# Patient Record
Sex: Female | Born: 1985 | Race: Black or African American | Hispanic: No | Marital: Married | Smoking: Former smoker
Health system: Southern US, Community
[De-identification: ages and names within clinical notes are randomized; demographics above are authoritative.]

## PROBLEM LIST (undated history)

## (undated) ENCOUNTER — Inpatient Hospital Stay (HOSPITAL_COMMUNITY): Payer: Self-pay

## (undated) DIAGNOSIS — K047 Periapical abscess without sinus: Secondary | ICD-10-CM

## (undated) DIAGNOSIS — D693 Immune thrombocytopenic purpura: Secondary | ICD-10-CM

## (undated) DIAGNOSIS — K029 Dental caries, unspecified: Secondary | ICD-10-CM

## (undated) HISTORY — DX: Immune thrombocytopenic purpura: D69.3

## (undated) HISTORY — PX: OTHER SURGICAL HISTORY: SHX169

---

## 2004-08-02 ENCOUNTER — Inpatient Hospital Stay (HOSPITAL_COMMUNITY): Admission: AD | Admit: 2004-08-02 | Discharge: 2004-08-04 | Payer: Self-pay | Admitting: *Deleted

## 2004-09-27 ENCOUNTER — Inpatient Hospital Stay (HOSPITAL_COMMUNITY): Admission: AD | Admit: 2004-09-27 | Discharge: 2004-09-27 | Payer: Self-pay | Admitting: *Deleted

## 2004-09-27 IMAGING — US US OB COMP LESS 14 WK
1 series · 18 of 28 positions shown · non-contrast
Comparison: none

HISTORY: Cramping, flank pain, pregnant

[Series 1: us ob comp<14 wk · 18 of 49 slices shown]
[im 1/49]
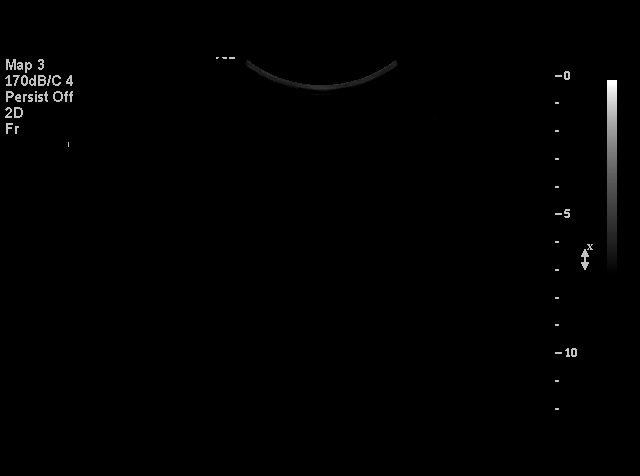
[im 4/49]
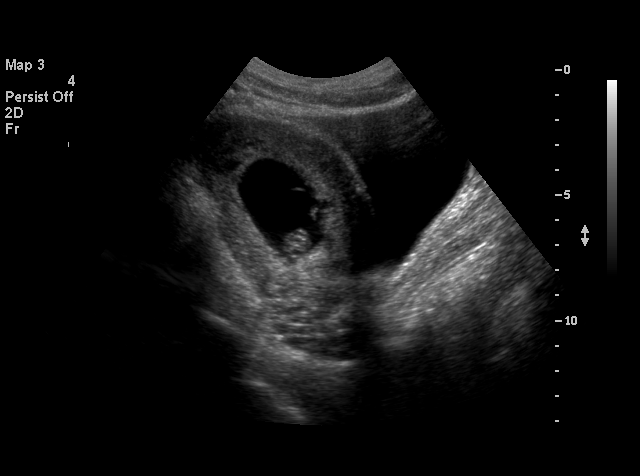
[im 6/49]
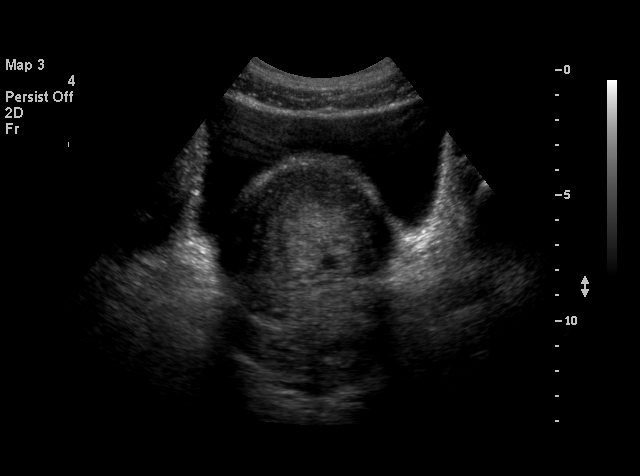
[im 9/49]
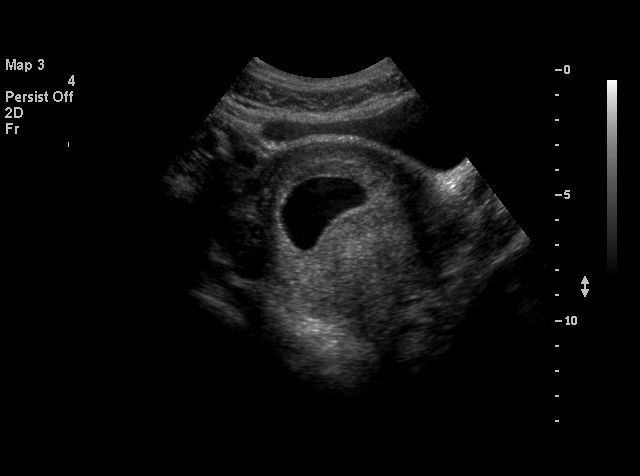
[im 13/49]
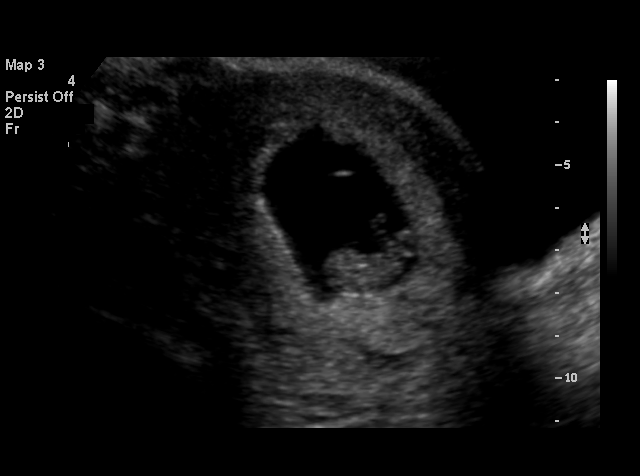
[im 15/49]
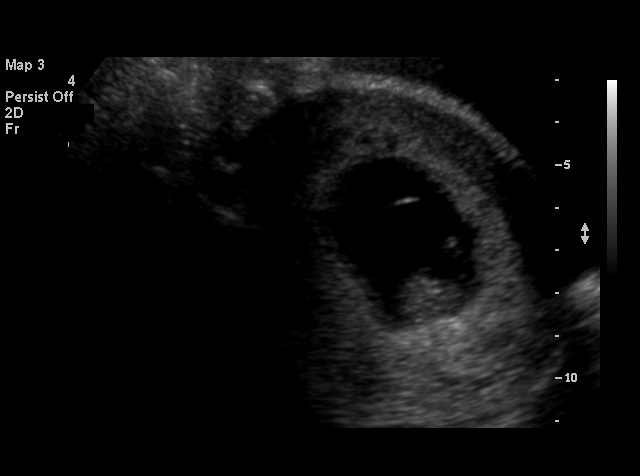
[im 18/49]
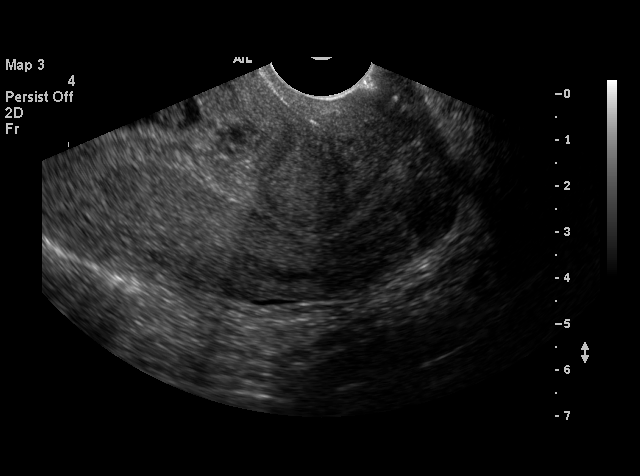
[im 20/49]
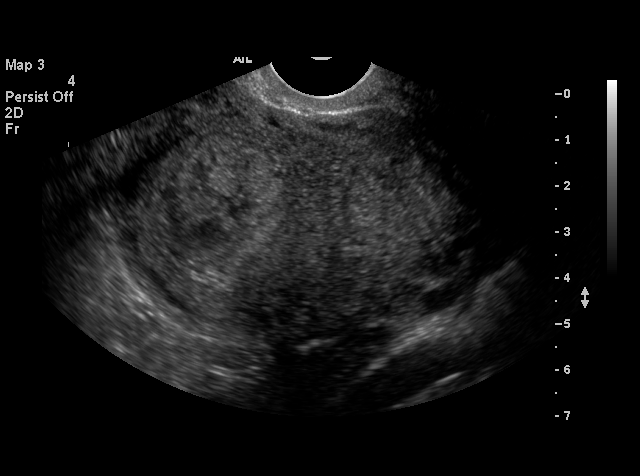
[im 24/49]
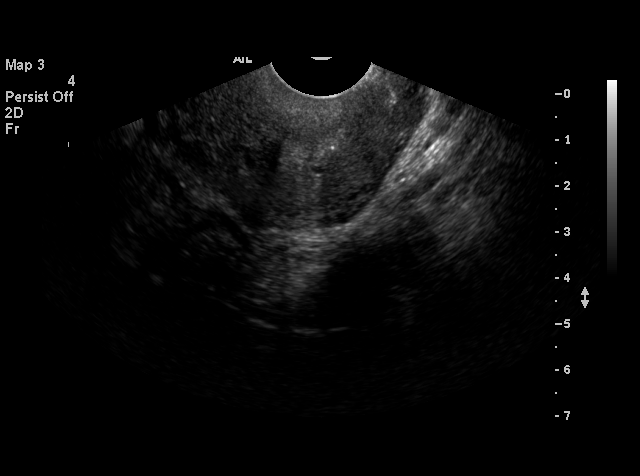
[im 25/49]
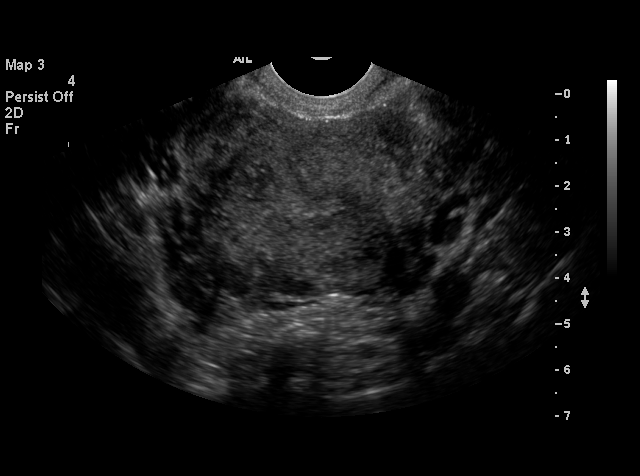
[im 29/49]
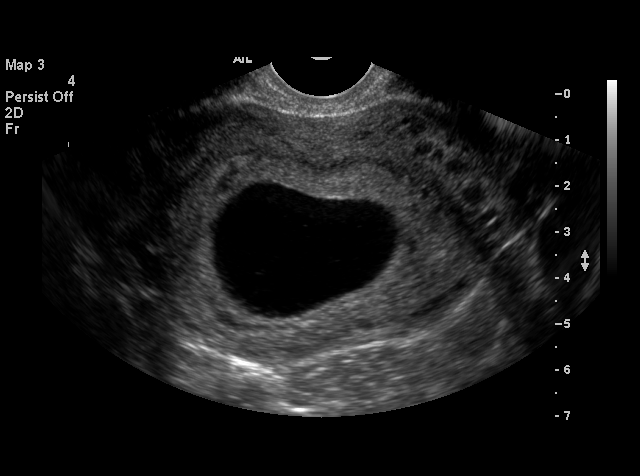
[im 31/49]
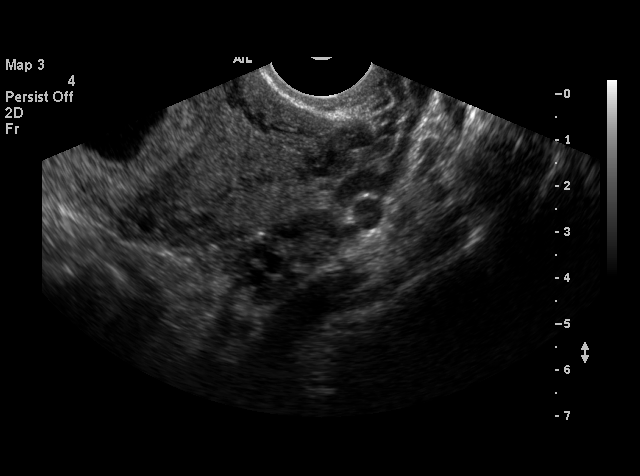
[im 34/49]
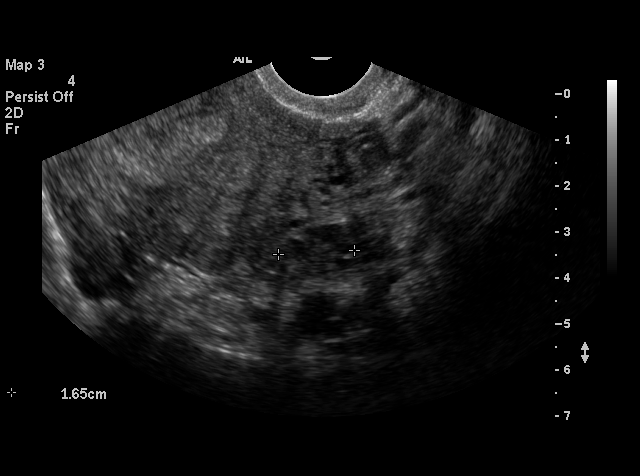
[im 38/49]
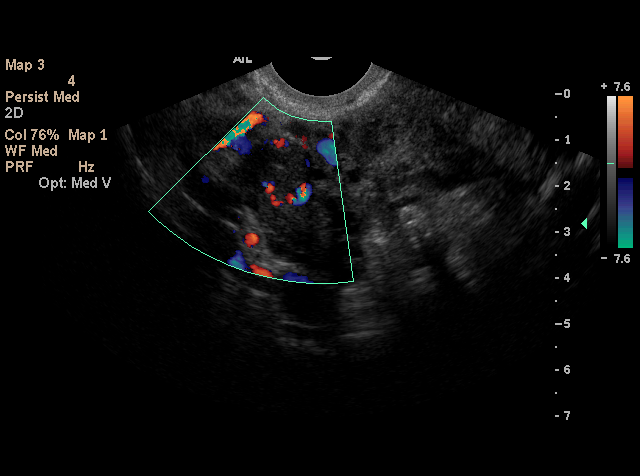
[im 40/49]
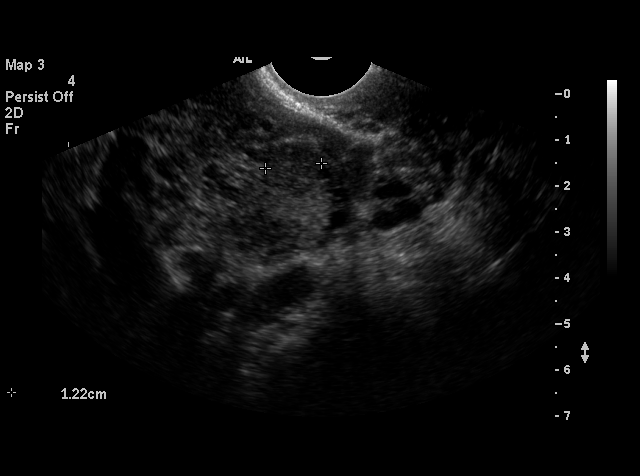
[im 43/49]
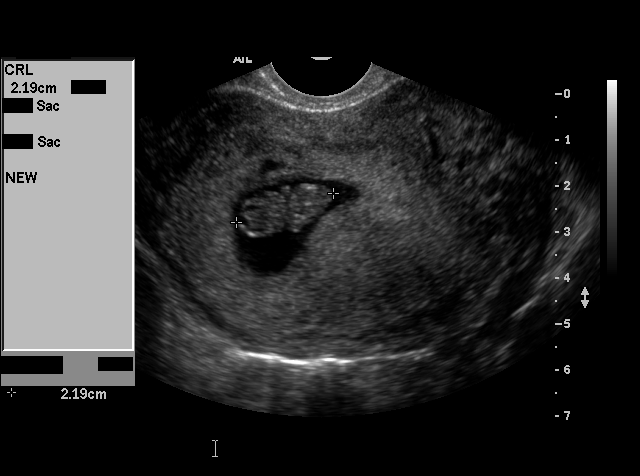
[im 45/49]
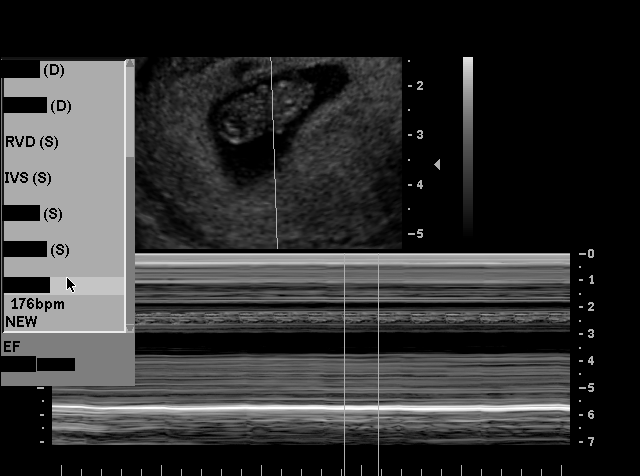
[im 49/49]
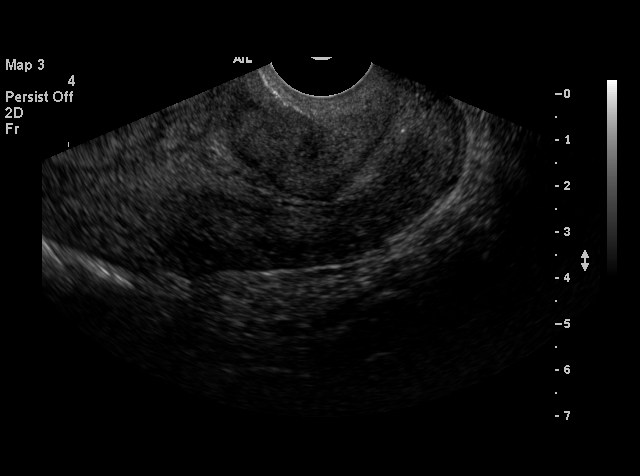

[18 of 28 positions shown; findings below may reference images not displayed]

OBSTETRICAL ULTRASOUND LESS THAN 14 WEEKS WITH OB TRANSVAGINAL ULTRASOUND MODIFIED

Transabdominal and endovaginal sonography of the gravid pelvis performed.

Single live intrauterine gestation identified
Crown-rump length 21.4 mm, corresponding to gestational age 8 weeks 6 days.
Very small focus of subchorionic hemorrhage noted.
Fetal cardiac rate noted at 176 beats per minute.
Cervix appears closed.
Right ovary normal appearance, 3.5 x 2.1 x 2.3 cm.
Left ovary normal appearance, 2.5 x 1.3 x 1.7 cm.
Tiny corpus luteum right ovary, 1.3 x 1.1 x 1.2 cm in size.
No adnexal masses.
IMPRESSION: Single live intrauterine gestation at 8 weeks 6 days estimated gestational age. Minimal
subchorionic hemorrhage.

## 2005-03-04 ENCOUNTER — Inpatient Hospital Stay (HOSPITAL_COMMUNITY): Admission: AD | Admit: 2005-03-04 | Discharge: 2005-03-04 | Payer: Self-pay | Admitting: Obstetrics and Gynecology

## 2005-03-04 IMAGING — US US OB COMP +14 WK
1 series · 14 of 28 positions shown · non-contrast
Comparison: none

CLINICAL DATA: Abdominal cramping.

[Series 1: us ob comp +14 wk · 0.35mm/px · 14 of 59 slices shown]
[im 3/59]
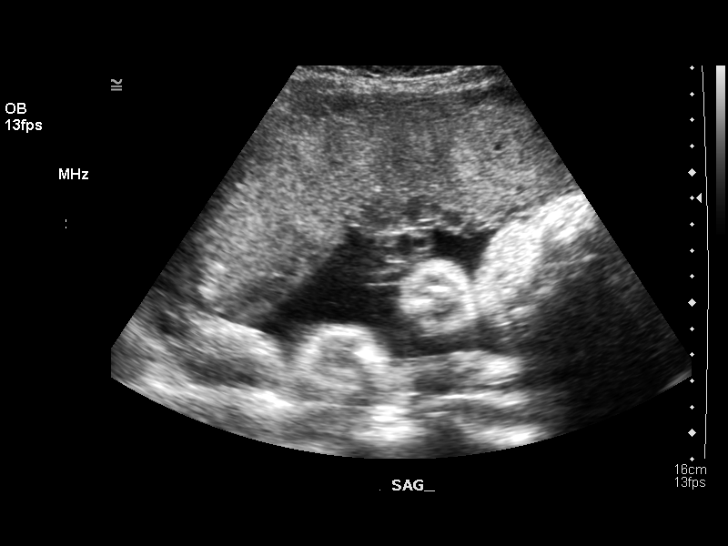
[im 7/59]
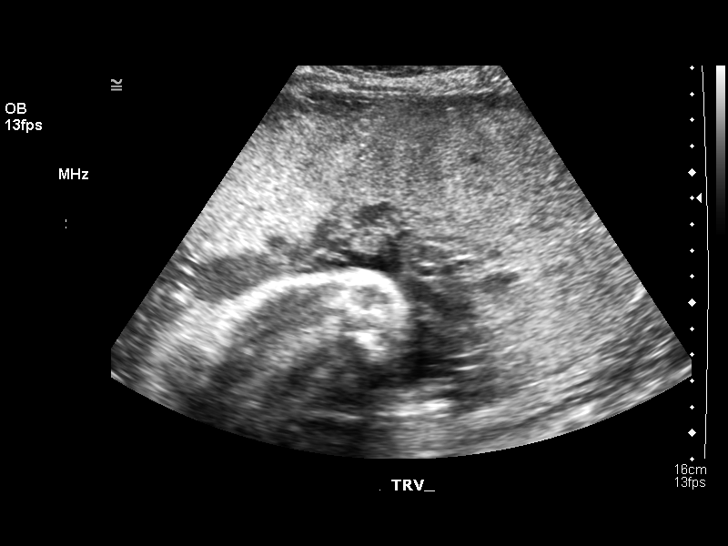
[im 11/59]
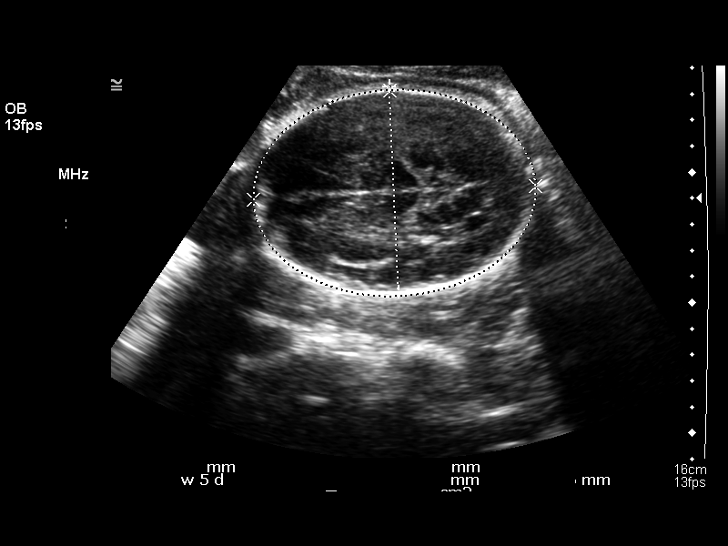
[im 16/59]
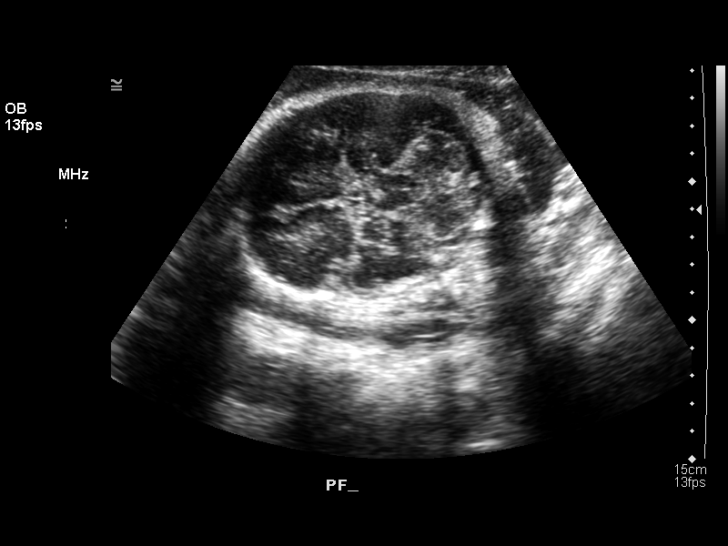
[im 20/59]
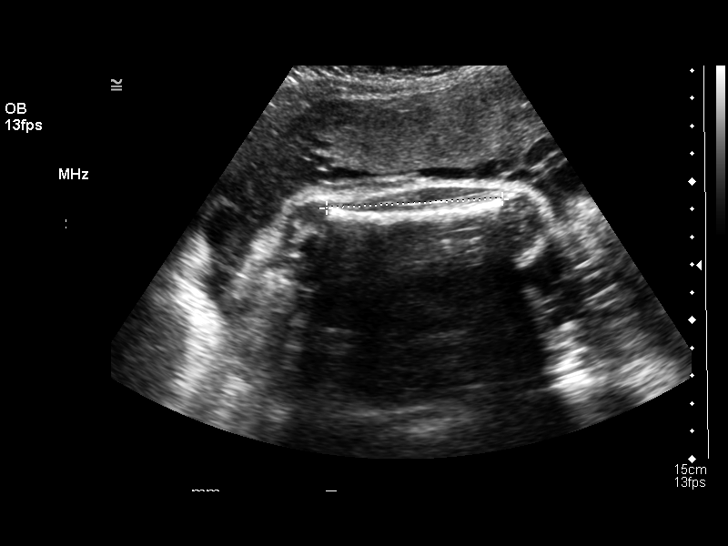
[im 24/59]
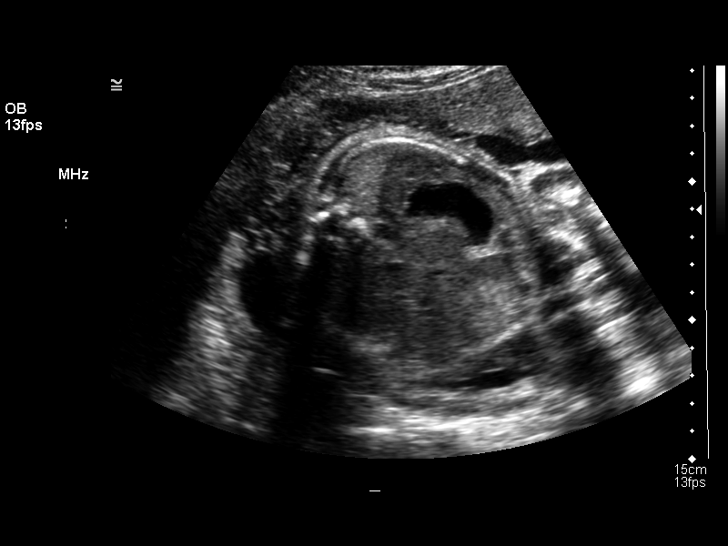
[im 28/59]
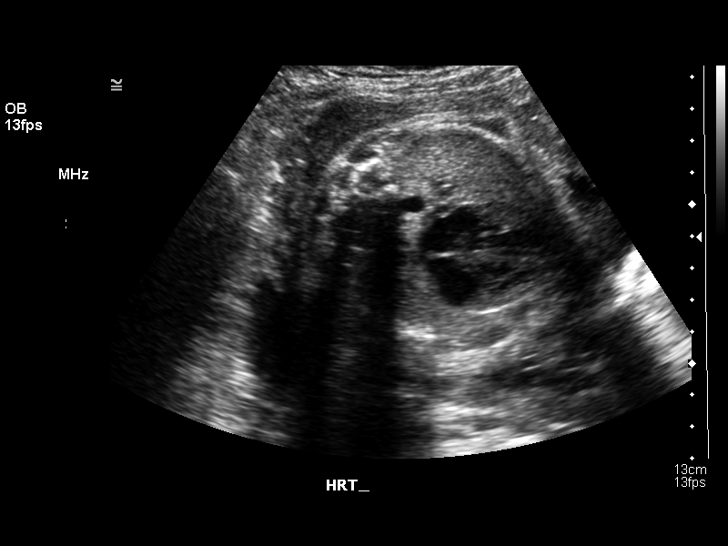
[im 33/59]
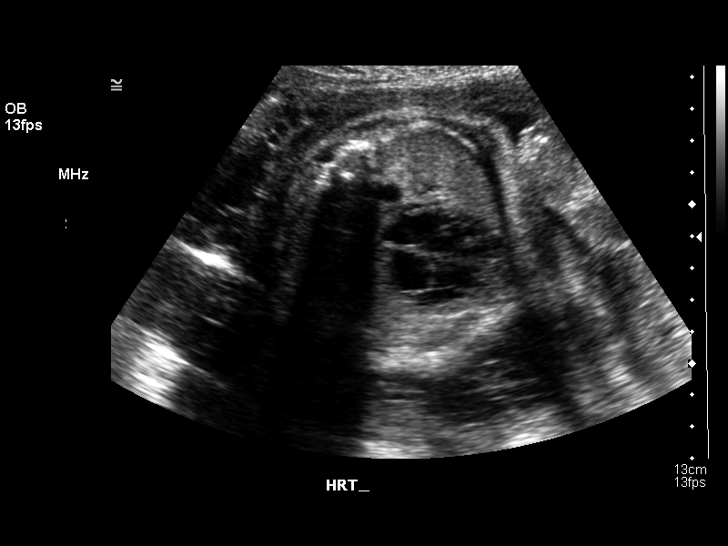
[im 37/59]
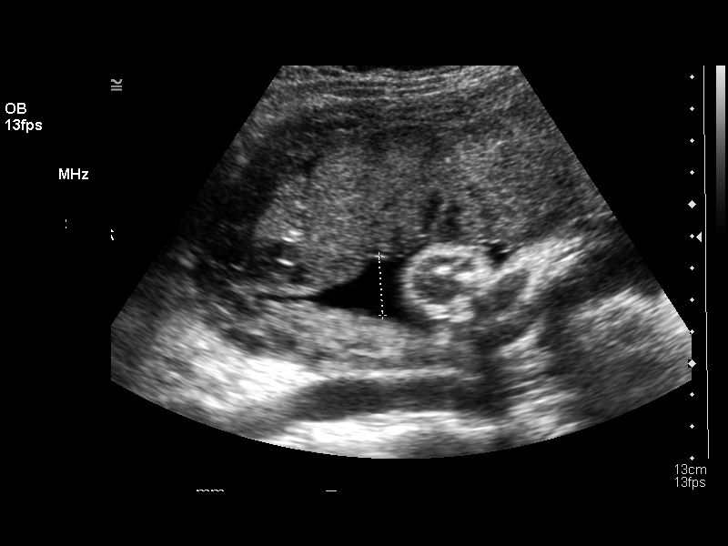
[im 41/59]
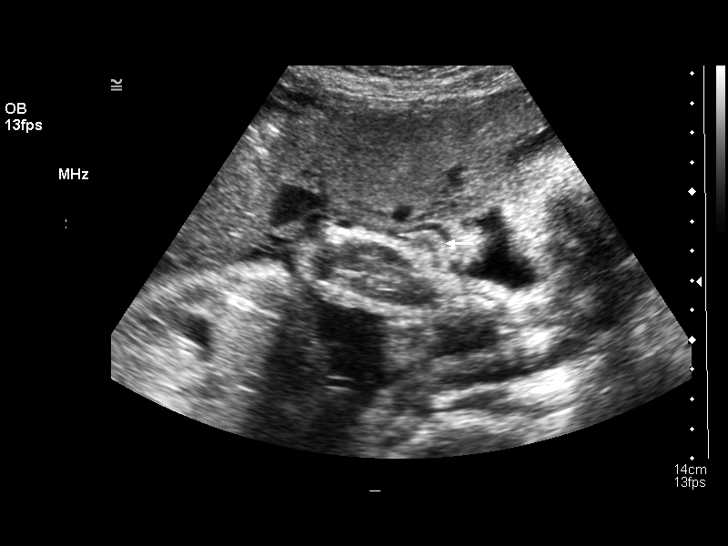
[im 46/59]
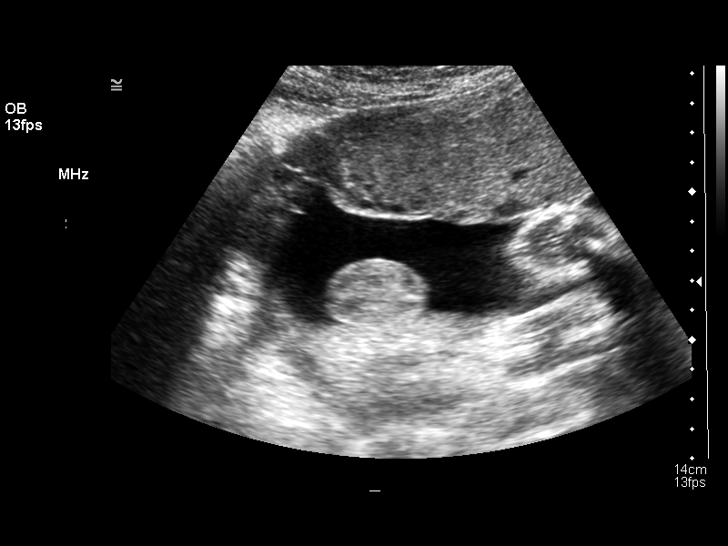
[im 50/59]
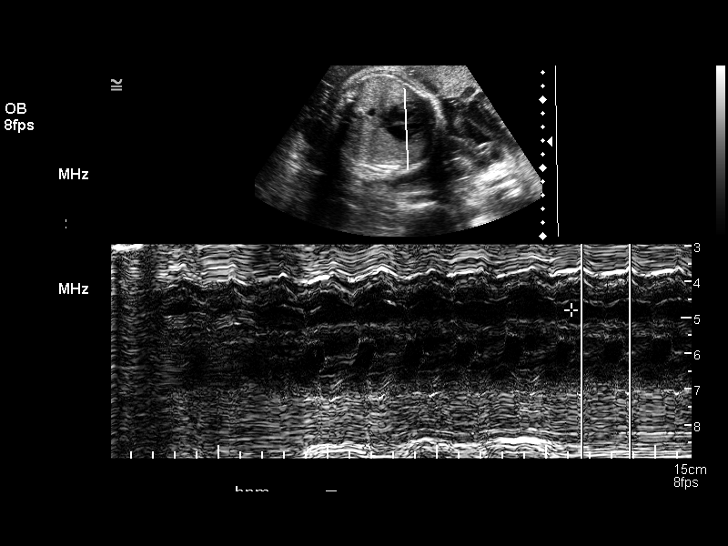
[im 54/59]
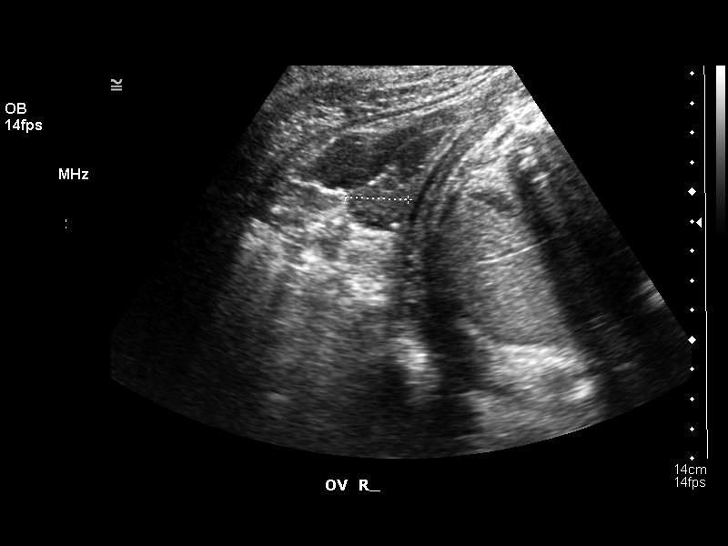
[im 59/59]
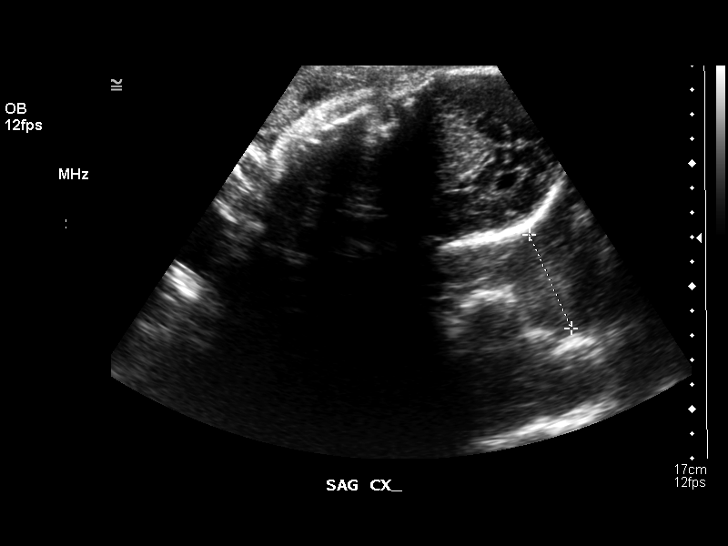

[14 of 28 positions shown; findings below may reference images not displayed]

OBSTETRICAL ULTRASOUND:

Number of fetuses:  1
Heart rate:  136
Movement:   Yes
Breathing:   Yes
Presentation:    Cephalic
Placental Location:   Anterior
Placental Grade:   II
Placenta Previa:    No
Amniotic Fluid (Subjective):   Normal
Amniotic Fluid (Objective):   13.3 cm AFI 

FETAL BIOMETRY:
BPD:  7.9 cm 32 w 0 d
HC:   29.8 cm 33 w 0 d
AC:   27.8 cm 31 w 6 d
FL:     6.3 cm 32 w 4 d
Mean GA:      32 w 3 d

EFW:  [I1] g (H) 90th - 95th%ile ([I1] - [I1] g) for 31 wks

FETAL ANATOMY
Lateral Ventricle:   Visualized
Thalamus/CSP:    Visualized
Posterior Fossa:   Visualized
Nuchal Region:    N/A
Spine:                Not visualized
4-Chamber Heart:    Visualized
Stomach:         Visualized
3-Vessel Cord:  Not visualized
Abd Cord Insert:  Not visualized
Kidneys:          Visualized
Bladder:           Visualized
Extremities:       Not visualized

Additional Anatomy Visualized:    LVOT, RVOT, upper lip, diaphragm, ductal arch,
aortic arch, and male genitalia.

MATERNAL UTERINE AND ADNEXAL FINDINGS
Cervix:   4.1 cm Transabdominally
IMPRESSION: 32 week 3 day live intrauterine gestation. Normal amniotic fluid volume.

## 2005-03-19 ENCOUNTER — Ambulatory Visit: Payer: Self-pay | Admitting: Family Medicine

## 2005-03-19 ENCOUNTER — Ambulatory Visit: Payer: Self-pay | Admitting: Hematology and Oncology

## 2005-04-09 ENCOUNTER — Ambulatory Visit: Payer: Self-pay | Admitting: Family Medicine

## 2005-04-16 ENCOUNTER — Ambulatory Visit: Payer: Self-pay | Admitting: Family Medicine

## 2005-04-23 ENCOUNTER — Ambulatory Visit: Payer: Self-pay | Admitting: *Deleted

## 2005-04-23 ENCOUNTER — Inpatient Hospital Stay (HOSPITAL_COMMUNITY): Admission: AD | Admit: 2005-04-23 | Discharge: 2005-04-26 | Payer: Self-pay | Admitting: Family Medicine

## 2005-04-23 ENCOUNTER — Ambulatory Visit: Payer: Self-pay | Admitting: Family Medicine

## 2005-04-24 ENCOUNTER — Encounter (INDEPENDENT_AMBULATORY_CARE_PROVIDER_SITE_OTHER): Payer: Self-pay | Admitting: *Deleted

## 2005-07-21 ENCOUNTER — Emergency Department (HOSPITAL_COMMUNITY): Admission: EM | Admit: 2005-07-21 | Discharge: 2005-07-21 | Payer: Self-pay | Admitting: Emergency Medicine

## 2005-12-17 ENCOUNTER — Inpatient Hospital Stay (HOSPITAL_COMMUNITY): Admission: AD | Admit: 2005-12-17 | Discharge: 2005-12-17 | Payer: Self-pay | Admitting: Obstetrics & Gynecology

## 2005-12-17 ENCOUNTER — Ambulatory Visit: Payer: Self-pay | Admitting: Family Medicine

## 2005-12-17 IMAGING — US US OB COMP +14 WK
1 series · 13 of 28 positions shown · non-contrast
Comparison: none

CLINICAL DATA: No prenatal care.  Uncertain menstrual dates.  Weakness and dizziness.

[Series 1: us ob comp +14 wk · 0.23mm/px · 13 of 68 slices shown]
[im 3/68]
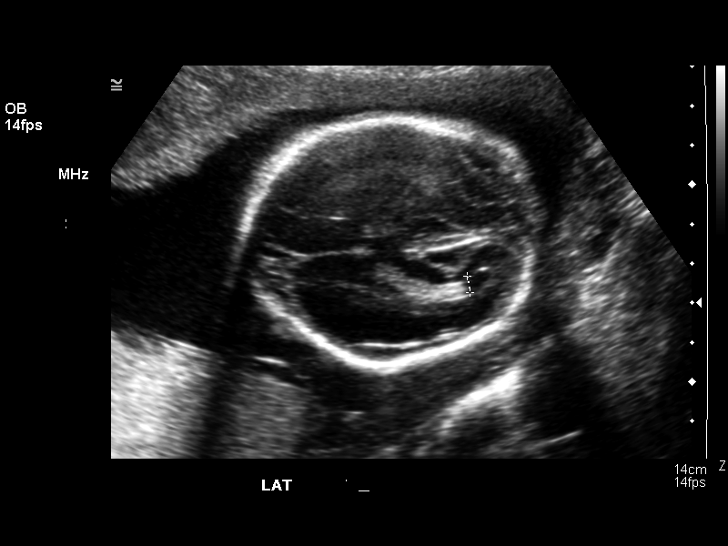
[im 8/68]
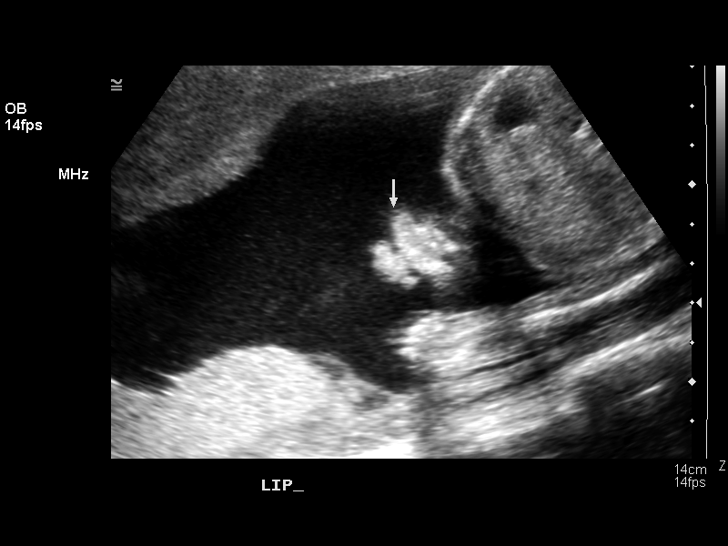
[im 13/68]
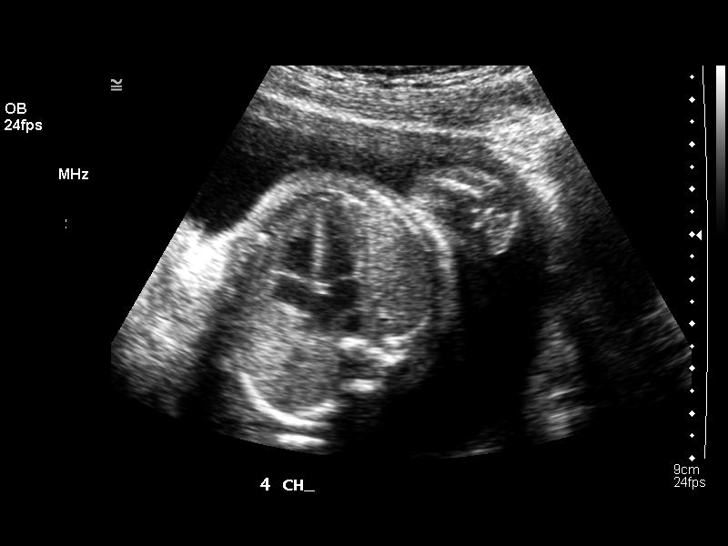
[im 18/68]
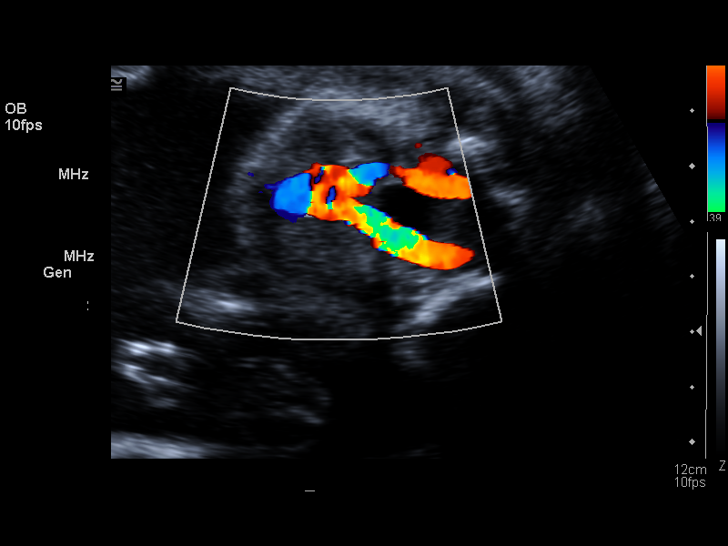
[im 23/68]
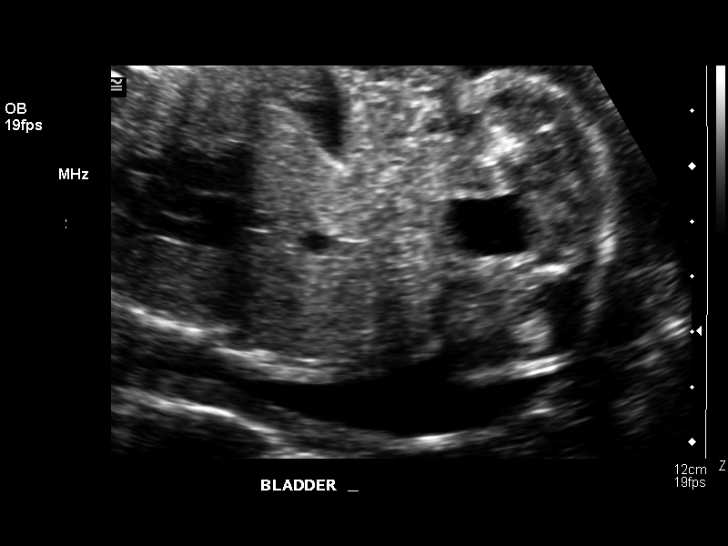
[im 28/68]
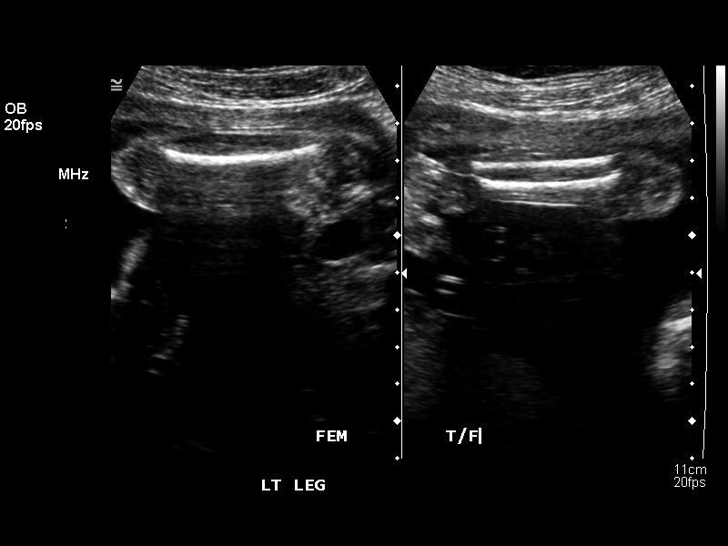
[im 35/68]
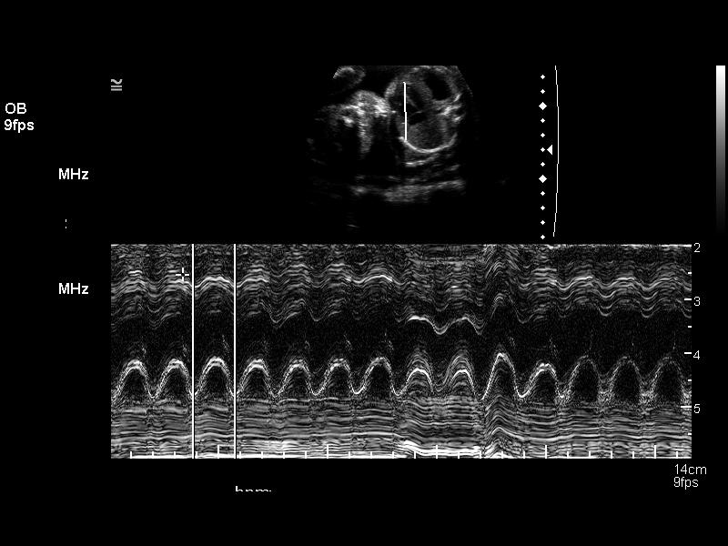
[im 40/68]
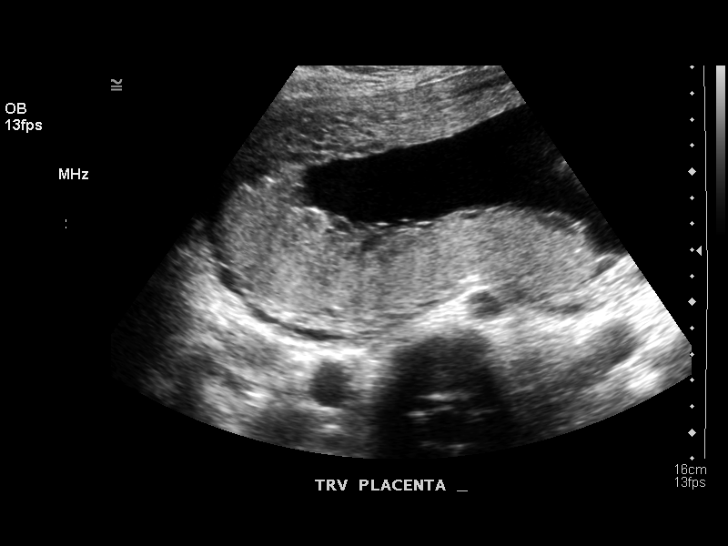
[im 45/68]
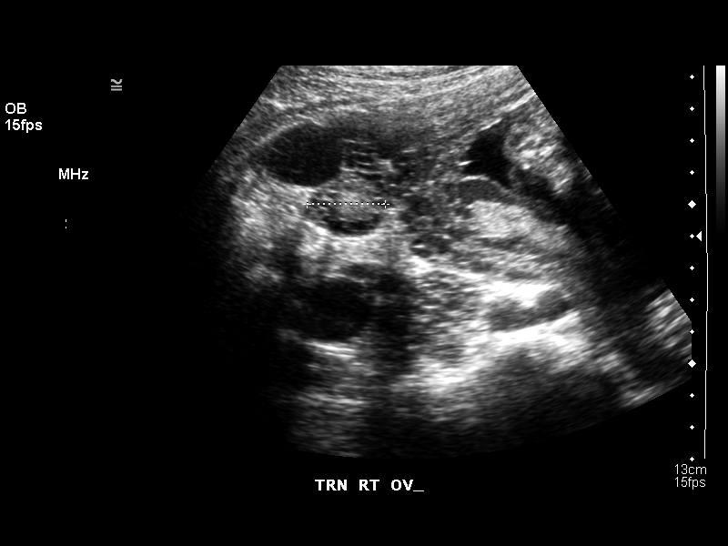
[im 50/68]
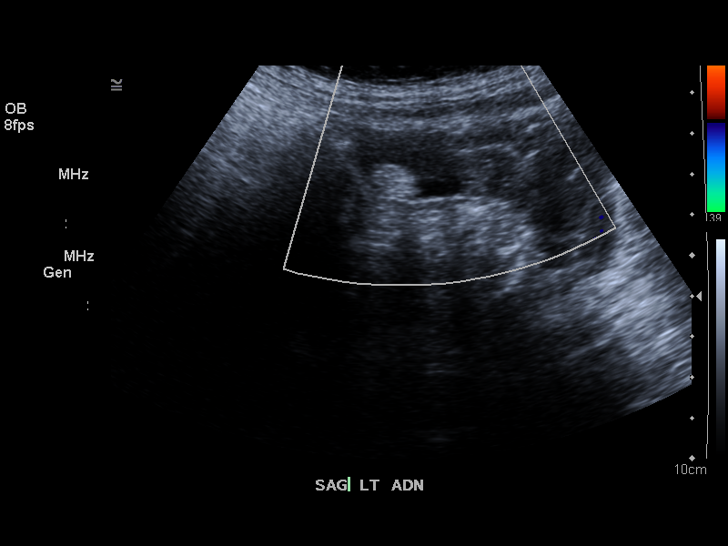
[im 55/68]
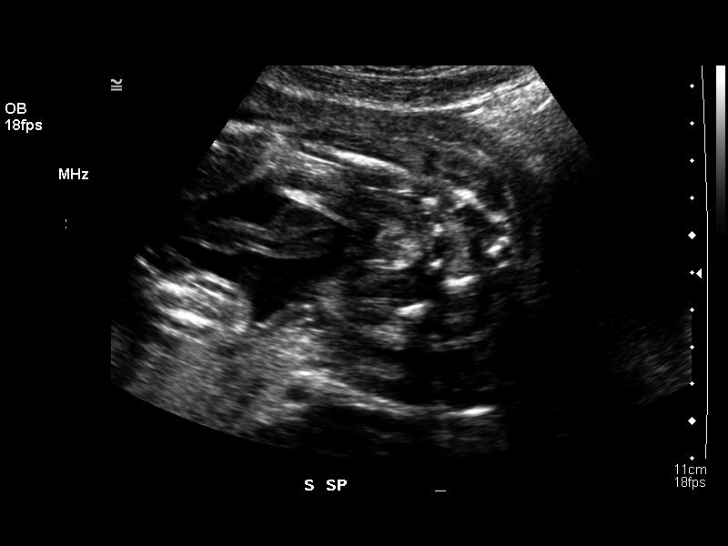
[im 60/68]
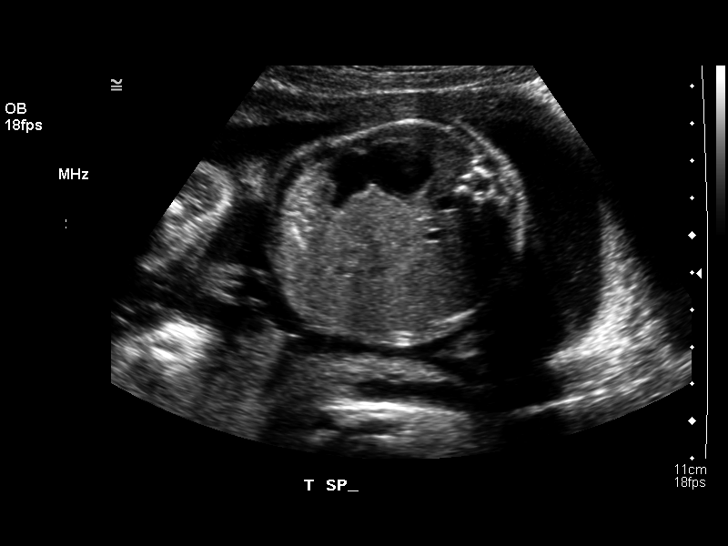
[im 65/68]
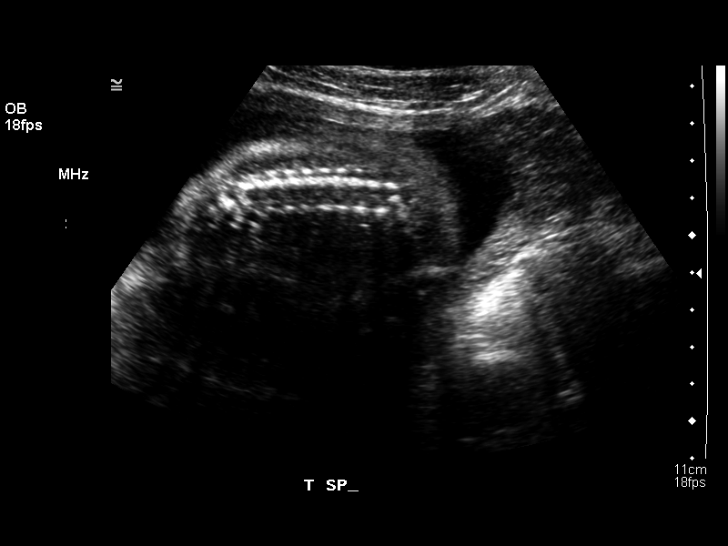

[13 of 28 positions shown; findings below may reference images not displayed]

OBSTETRICAL ULTRASOUND:
Number of Fetuses:  1
Heart Rate:  157
Movement:  Yes
Breathing:  No  
Presentation:  Transverse
Placental Location:  Posterior
Grade:  I
Previa:  No
Amniotic Fluid (Subjective):  Normal
Amniotic Fluid (Objective):   4.1 cm Vertical pocket 

FETAL BIOMETRY
BPD:  6.1 cm  25 w 0 d
HC:  22.8 cm   24 w 5 d
AC:  20.1 cm   24 w 5 d
FL:    4.4 cm   24 w 4 d

MEAN GA:  24 w 5 d  US EDC:  [DATE]

EFW:  723 g (H) 50th ? 75th%ile (660 ? 772 g) For 25 wks

FETAL ANATOMY
Lateral Ventricles:    Visualized 
Thalami/CSP:      Visualized 
Posterior Fossa:  Visualized 
Nuchal Region:    N/A
Spine:      Visualized 
4 Chamber Heart on Left:      Visualized 
Stomach on Left:      Visualized 
3 Vessel Cord:    Visualized 
Cord Insertion site:    Visualized 
Kidneys:  Visualized 
Bladder:  Visualized 
Extremities:      Visualized 

ADDITIONAL ANATOMY VISUALIZED:  LVOT, RVOT, upper lip, orbits, diaphragm, heel and male genitalia.

Evaluation limited by:  Fetal position.

MATERNAL UTERINE AND ADNEXAL FINDINGS
Cervix: 5.0 cm Transabdominally. 
A complex mass is seen in the left ovary with a cystic component and hyperechoic soft tissue component, which is consistent with an ovarian dermoid.  This measures 2.5 x 1.5 x 3.0 cm.  The right ovary is normal in appearance.
IMPRESSION: 1.  Single living intrauterine fetus with mean gestational age of 24 weeks 5 days and sonographic EDC of [DATE].
2.  No evidence of fetal anatomic abnormality.
3.  3 cm left ovarian dermoid noted.

## 2005-12-22 ENCOUNTER — Ambulatory Visit: Payer: Self-pay | Admitting: Hematology and Oncology

## 2005-12-23 ENCOUNTER — Ambulatory Visit: Payer: Self-pay | Admitting: Obstetrics & Gynecology

## 2006-02-23 ENCOUNTER — Ambulatory Visit: Payer: Self-pay | Admitting: Hematology and Oncology

## 2006-03-01 ENCOUNTER — Ambulatory Visit: Payer: Self-pay | Admitting: Obstetrics & Gynecology

## 2006-03-08 ENCOUNTER — Ambulatory Visit: Payer: Self-pay | Admitting: *Deleted

## 2006-03-08 ENCOUNTER — Ambulatory Visit (HOSPITAL_COMMUNITY): Admission: RE | Admit: 2006-03-08 | Discharge: 2006-03-08 | Payer: Self-pay | Admitting: *Deleted

## 2006-03-08 IMAGING — US US OB FOLLOW-UP
1 series · 13 of 28 positions shown · non-contrast
Comparison: none

CLINICAL DATA: Assess growth.

[Series 1: us ob follow-up · 0.35mm/px · 13 of 39 slices shown]
[im 2/39]
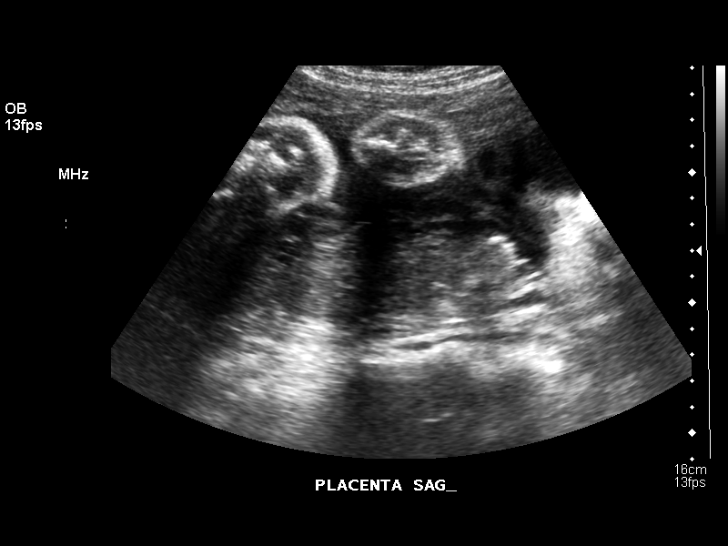
[im 5/39]
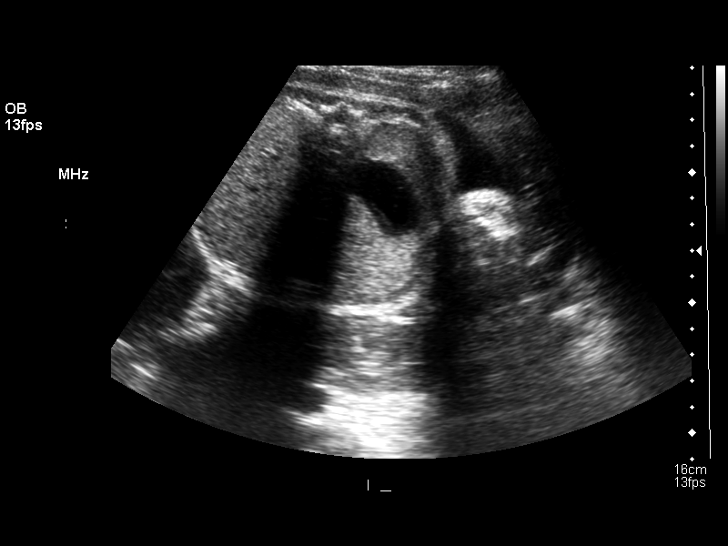
[im 8/39]
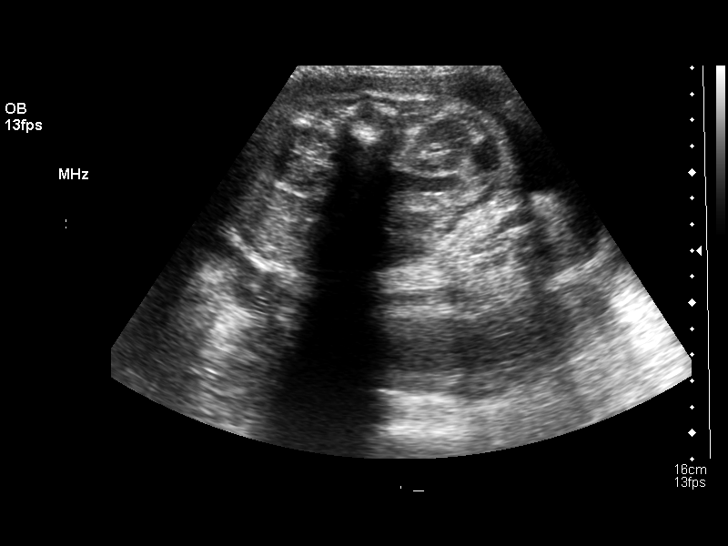
[im 10/39]
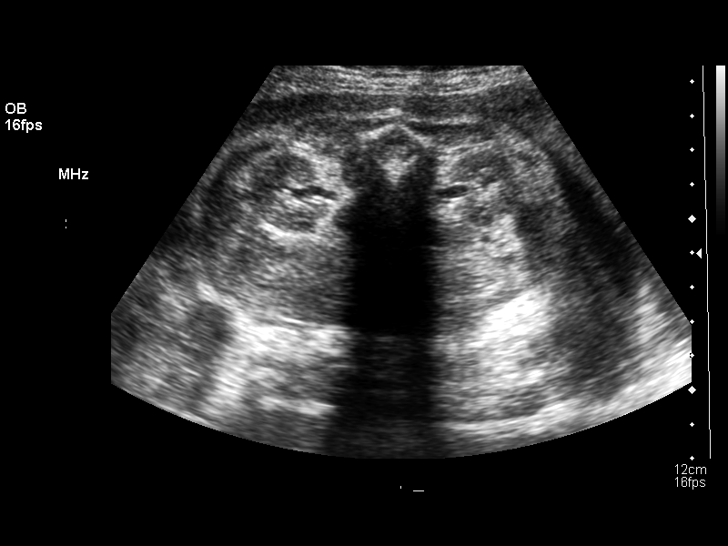
[im 13/39]
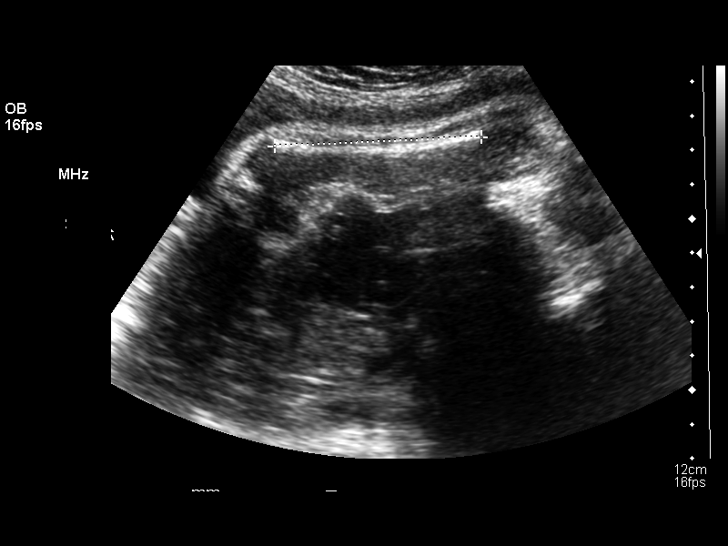
[im 16/39]
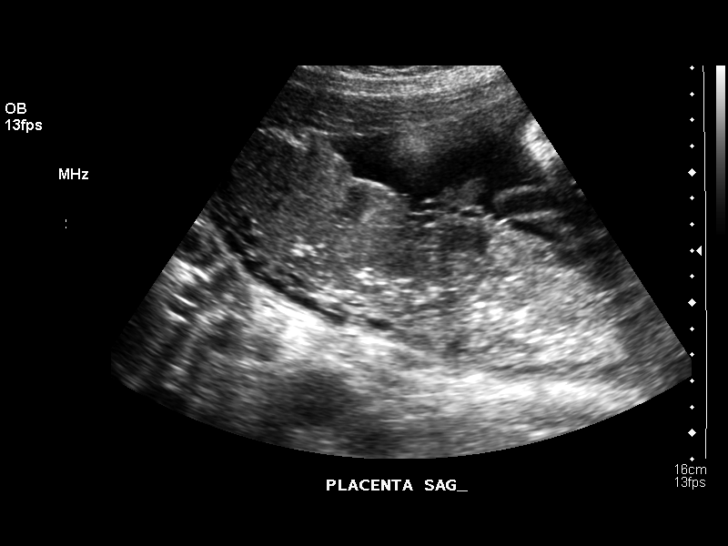
[im 20/39]
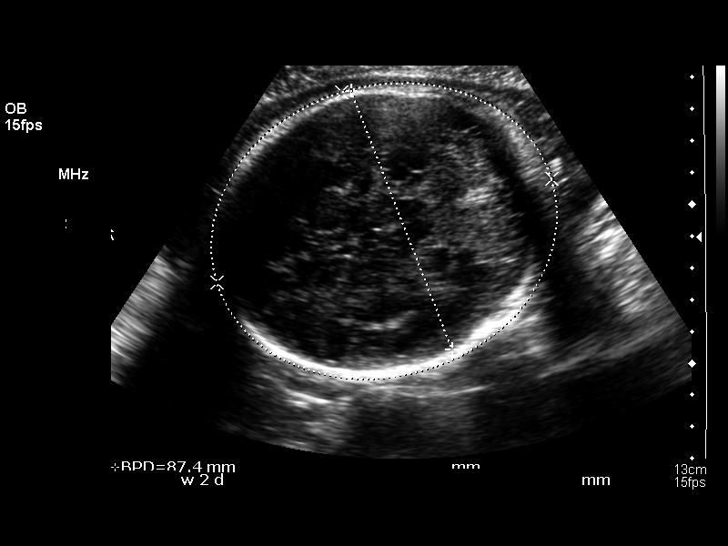
[im 23/39]
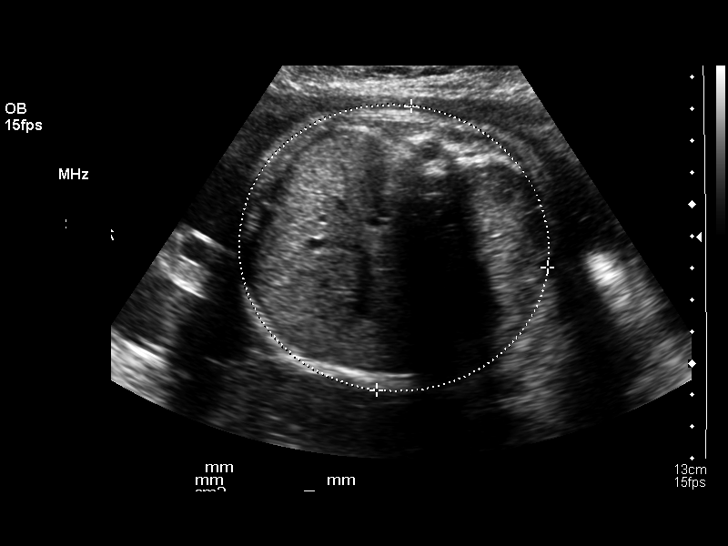
[im 26/39]
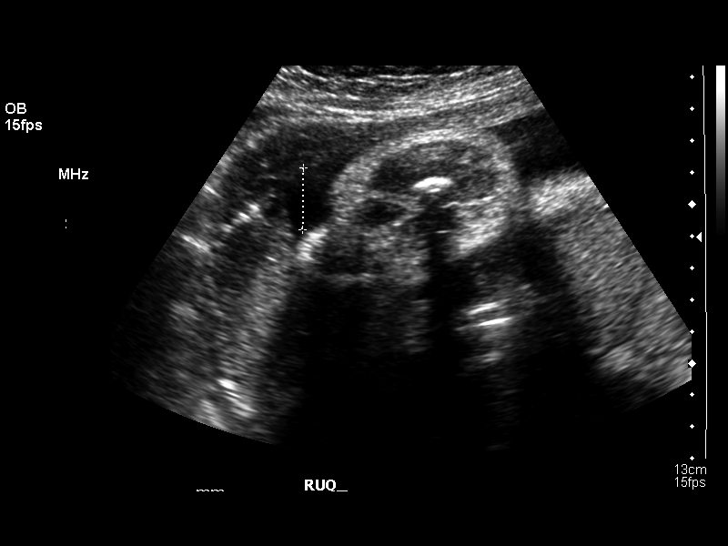
[im 29/39]
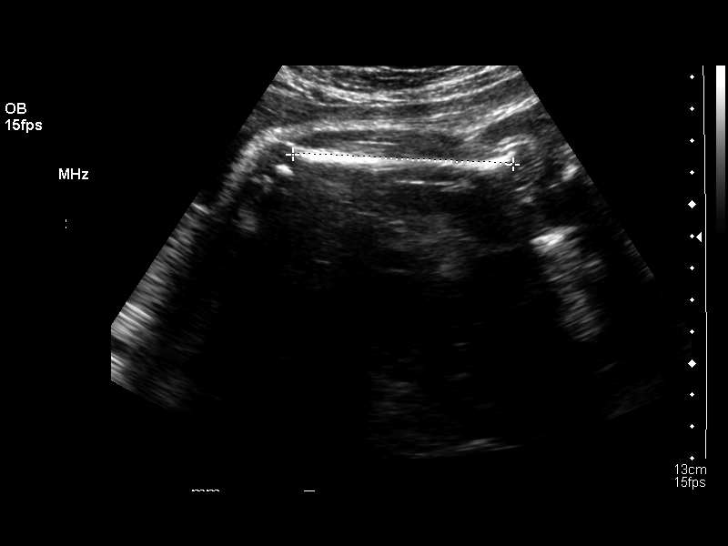
[im 31/39]
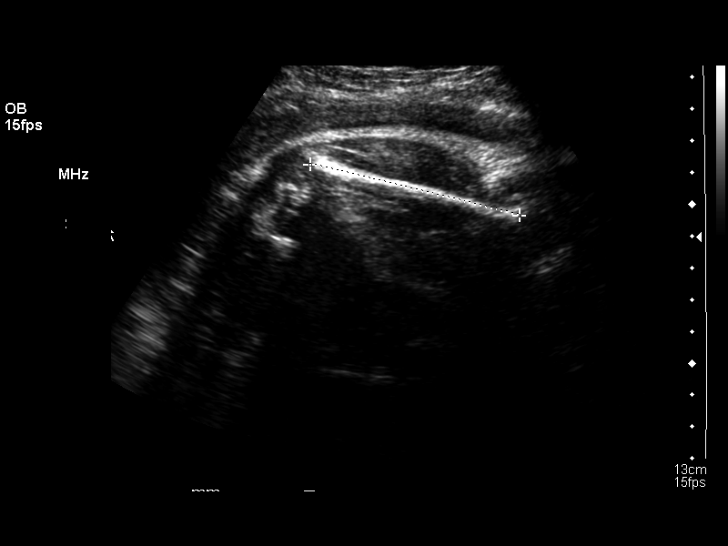
[im 34/39]
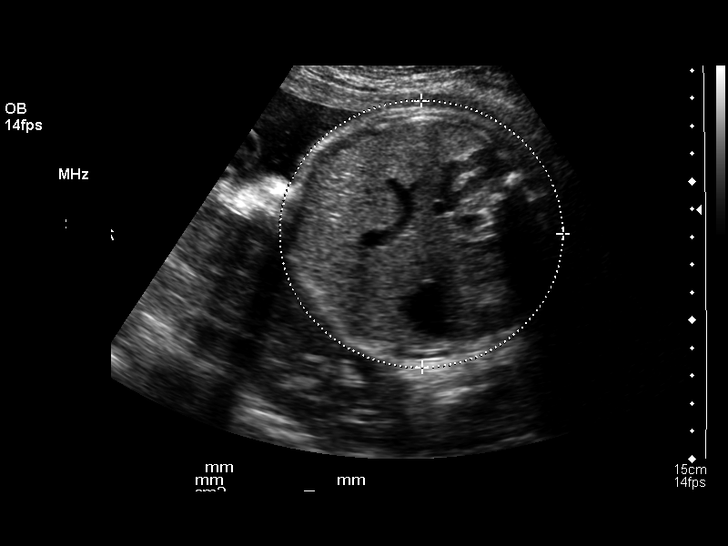
[im 37/39]
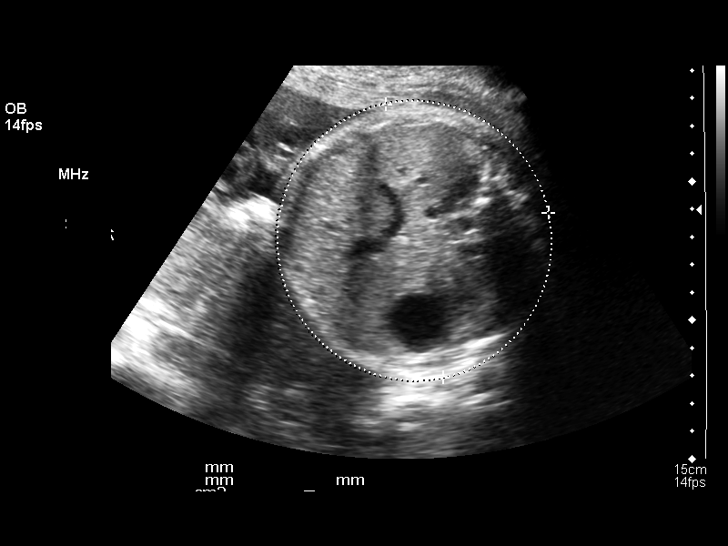

[13 of 28 positions shown; findings below may reference images not displayed]

OBSTETRICAL ULTRASOUND RE-EVALUATION:
Number of Fetuses: 1
Heart Rate:  
Movement:  Yes
Breathing:  Yes
Presentation:  Cephalic
Placental Location:  Posterior
Grade: II
Previa:  No
Amniotic Fluid (subjective):  Normal
Amniotic Fluid (objective):  14.1 cm AFI (5th -95th%ile = 7.7 ? 24.9 cm for 36 wks)

FETAL BIOMETRY
BPD:  8.8 cm   35 w 5 d
HC:  31.7 cm   35 w 4 d
AC:  31.1 cm  35 w 0 d
FL:  6.9 cm   35 w 3 d

Mean GA:  35 w 3 d  US EDC:  [DATE]
Assigned GA:  36 w 2 d  Assigned EDC:  [DATE]
Fetal indices are within normal limits.
EFW:  [5W] g (H) 25th ? 50th%ile ([5W] ? [5W] g) For 36 wks

FETAL ANATOMY
Lateral Ventricles:  Previously seen 
Thalami/CSP:  Previously seen 
Posterior Fossa:  Previously seen 
Nuchal Region:  N/A
Spine:  Previously seen 
4 Chamber Heart on Left:  Previously seen 
Stomach on Left:  Visualized 
3 Vessel Cord:  Previously seen 
Cord Insertion Site:  Previously seen 
Kidneys:  Visualized 
Bladder:  Visualized 
Extremities:  Previously seen 

MATERNAL UTERINE AND ADNEXAL FINDINGS
Cervix: Not evaluated.
IMPRESSION: 1.  Single intrauterine pregnancy demonstrating an estimated gestational age by ultrasound of 35 weeks and 3 days.  This is 6 days behind expected estimated gestational age by initial ultrasound of 36 weeks and 2 days.  Currently the estimated fetal weight is just below the 50th percentile for a 36 week gestation.  
2.  Subjectively and quantitatively normal amniotic fluid volume.  
3.  No late developing fetal anatomic abnormalities are identified associated with the stomach, kidneys, or bladder.  A four chamber heart and lateral ventricles could not be visualized due to positioning on today?s exam.  The fetal profile, 5th digit, aortic and ductal arches which have not been seen previously could not be seen today.  
4.  The patient was sent with a preliminary copy of today?s report to a clinic visit immediately following this exam.

## 2006-03-18 ENCOUNTER — Ambulatory Visit: Payer: Self-pay | Admitting: Gynecology

## 2006-03-25 ENCOUNTER — Ambulatory Visit: Payer: Self-pay | Admitting: Gynecology

## 2006-03-29 ENCOUNTER — Ambulatory Visit: Payer: Self-pay | Admitting: Obstetrics and Gynecology

## 2006-03-29 ENCOUNTER — Inpatient Hospital Stay (HOSPITAL_COMMUNITY): Admission: RE | Admit: 2006-03-29 | Discharge: 2006-04-01 | Payer: Self-pay | Admitting: Gynecology

## 2006-04-27 ENCOUNTER — Ambulatory Visit: Payer: Self-pay | Admitting: Hematology and Oncology

## 2007-02-27 IMAGING — CR DG OS CALCIS 2+V*L*
2 series · 2 of 2 positions shown · non-contrast
Comparison: None

CLINICAL DATA: Injured foot

LEFT OS CALCIS - 2+ VIEW

[t calcaneus axial left]
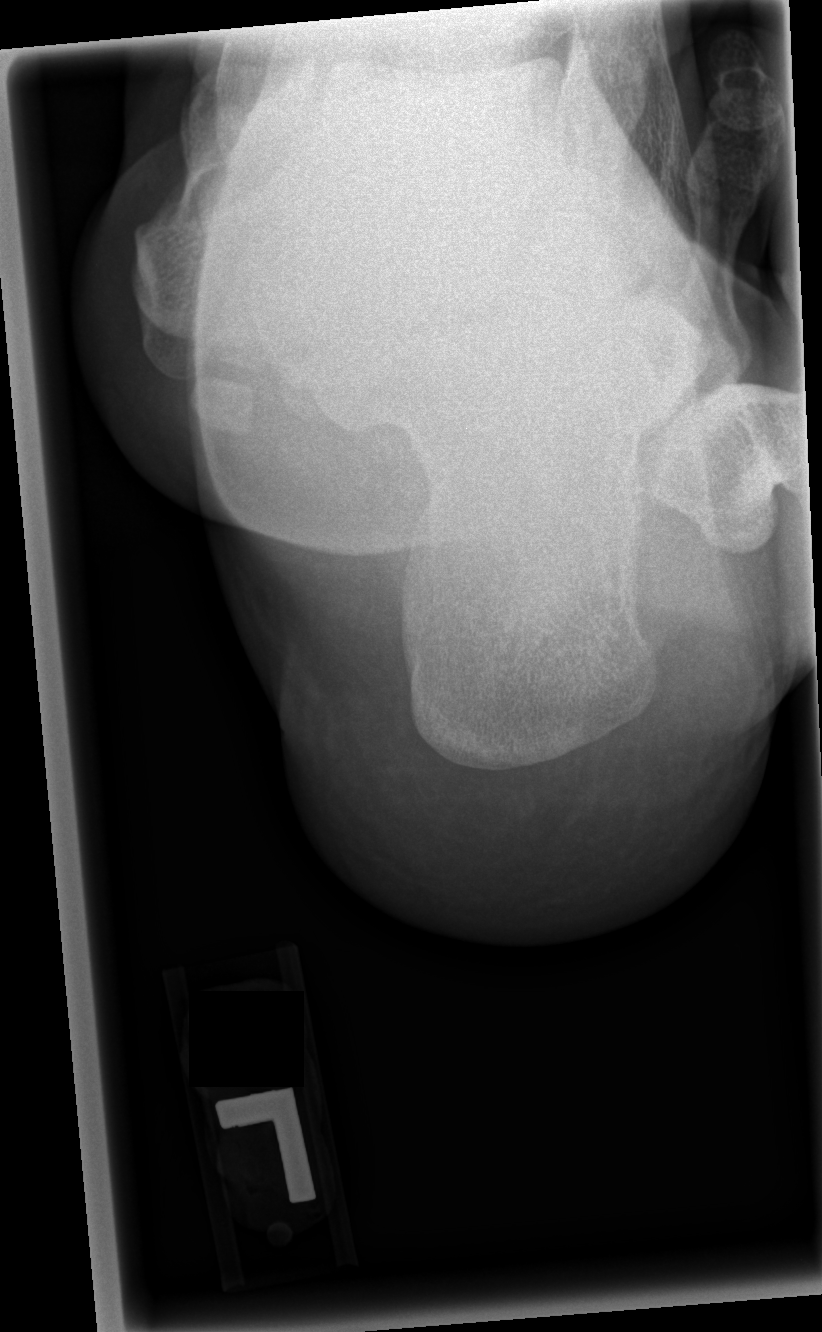

[t calcaneus lat left]
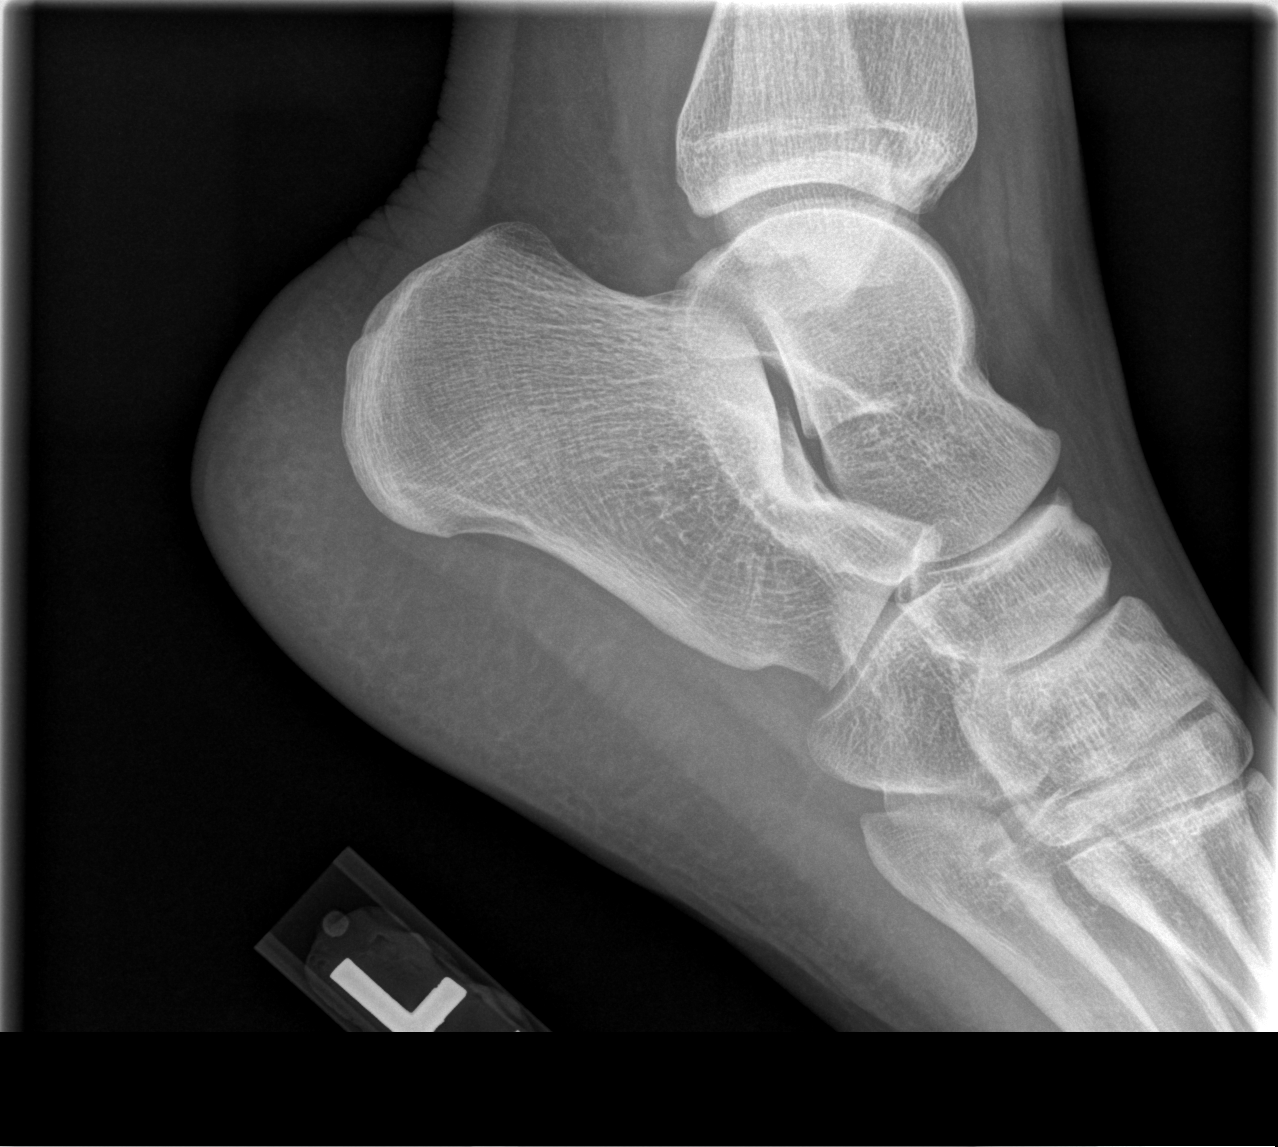

[2 of 2 positions shown; findings below may reference images not displayed]

FINDINGS: No acute bony findings.  The joint spaces are maintained.
IMPRESSION: 1.  No calcaneal fractures are seen.

## 2007-05-05 ENCOUNTER — Inpatient Hospital Stay (HOSPITAL_COMMUNITY): Admission: AD | Admit: 2007-05-05 | Discharge: 2007-05-05 | Payer: Self-pay | Admitting: Gynecology

## 2007-05-05 IMAGING — US US OB COMP LESS 14 WK
1 series · 14 of 28 positions shown · non-contrast
Comparison: [DATE]

CLINICAL DATA: Bleeding. Cramps.

OBSTETRICAL ULTRASOUND [<14 WK/AND TRANSVAGINAL OB]:
TECHNIQUE: Both transabdominal and transvaginal ultrasound examinations were
performed for complete evaluation of the gestation as well as the maternal
uterus, adnexal regions, and pelvic cul-de-sac.

[Series 1: us ob comp less 14 wks · 14 of 47 slices shown]
[im 2/47]
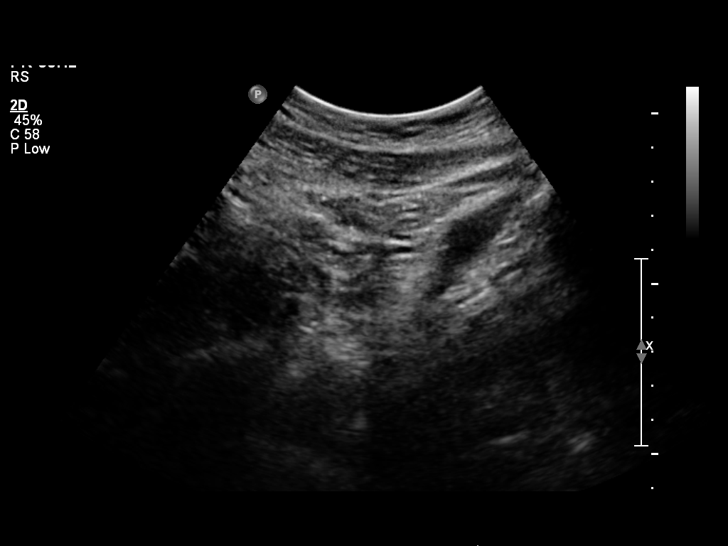
[im 6/47]
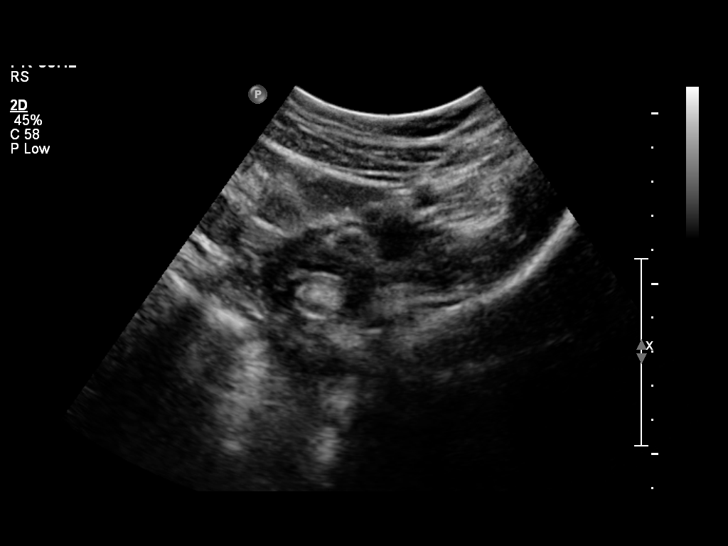
[im 9/47]
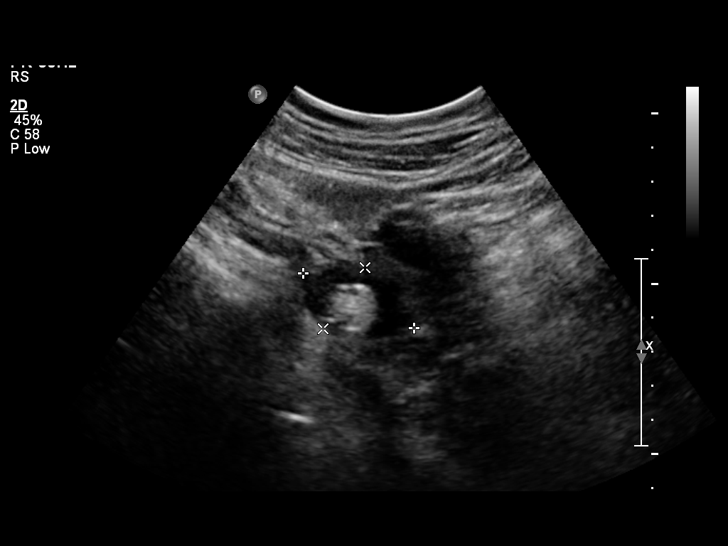
[im 12/47]
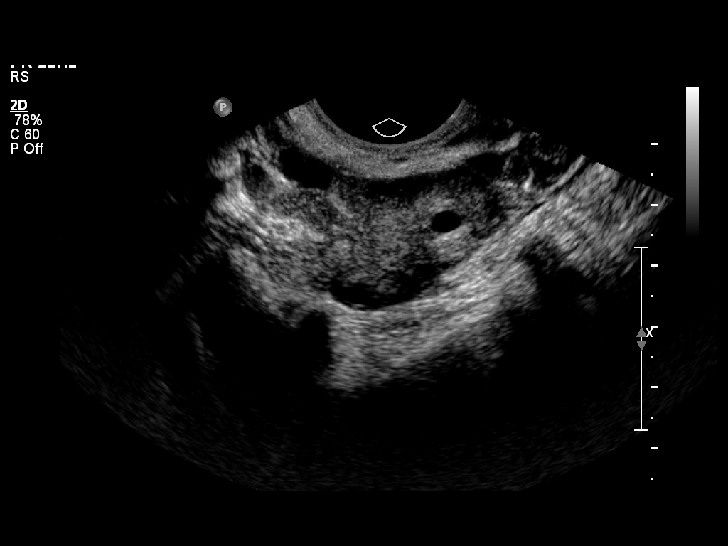
[im 16/47]
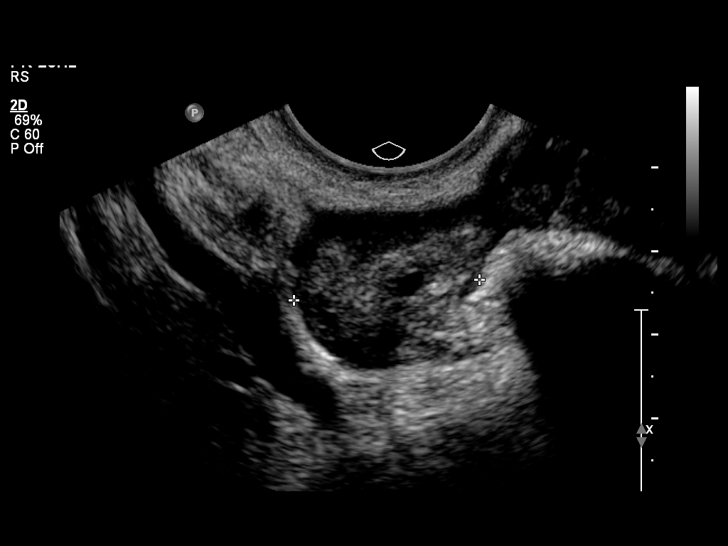
[im 19/47]
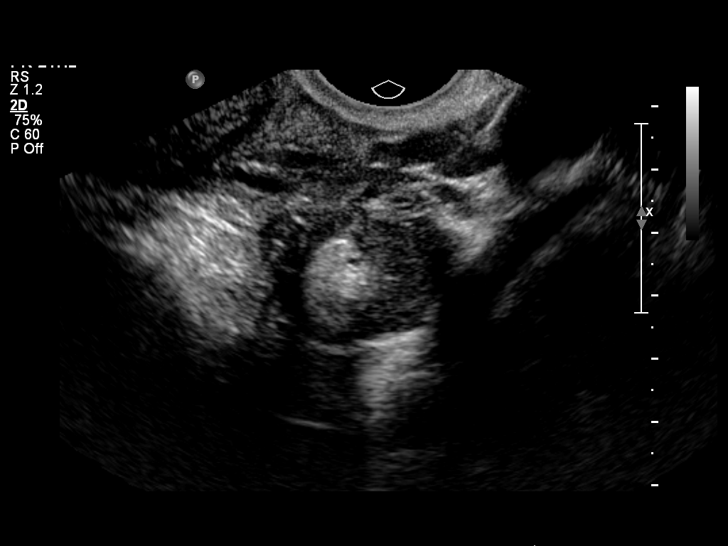
[im 23/47]
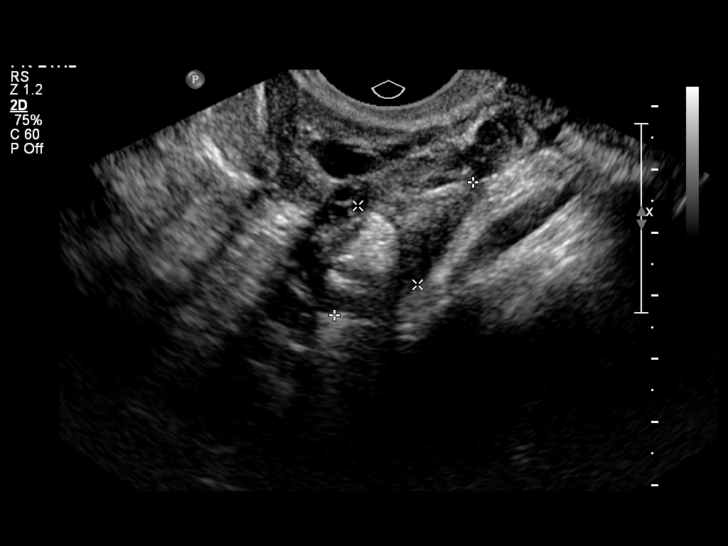
[im 26/47]
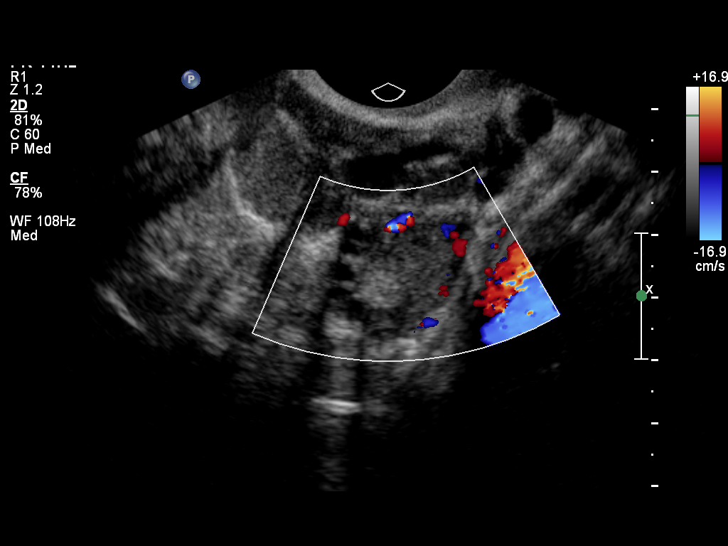
[im 29/47]
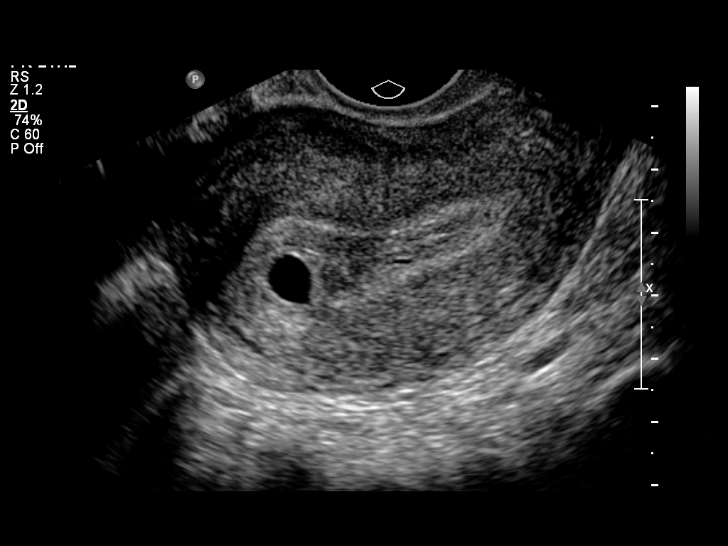
[im 33/47]
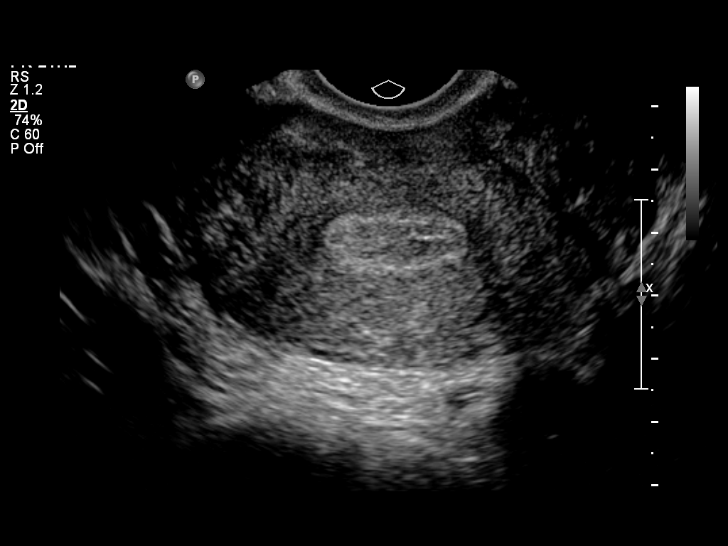
[im 36/47]
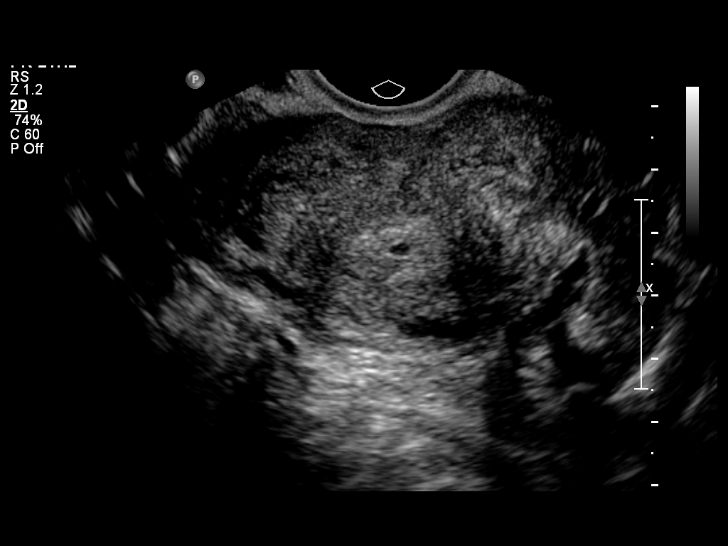
[im 40/47]
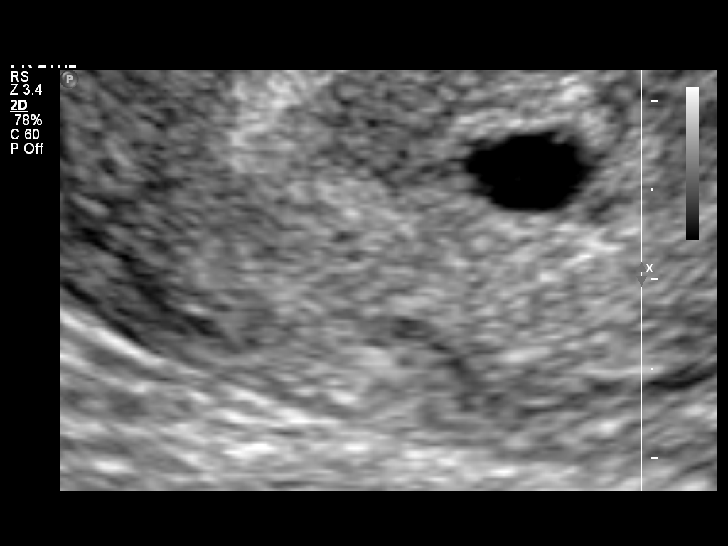
[im 43/47]
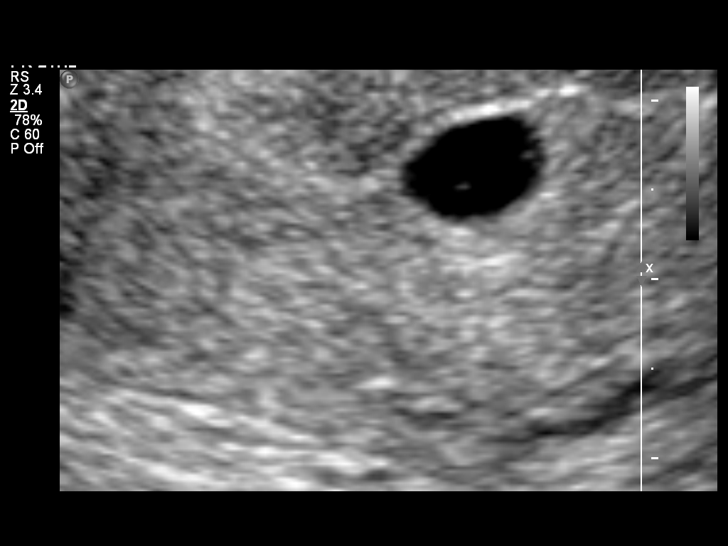
[im 47/47]
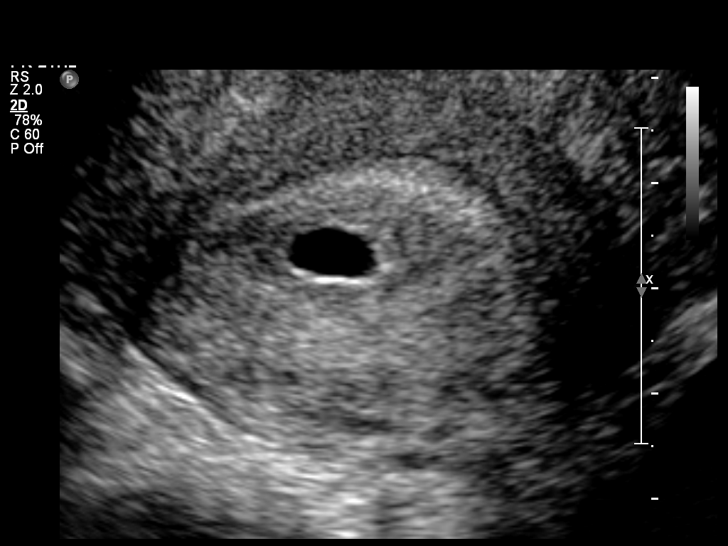

[14 of 28 positions shown; findings below may reference images not displayed]

FINDINGS: Intrauterine gestational sac with mean sac diameter of approximately
7 mm, corresponding to 5 weeks 2 days. Tiny subchorionic hemorrhage possibly
identified on image 50. No fetal pole or cardiac activity identified. No yolk
sac.

Right ovary within normal limits.

1.5 x 1.3 x 1.3 cm echogenic focus in left ovary.

No significant free fluid.
IMPRESSION: 1. Mean sac diameter of 7 mm corresponds to 5 weeks 2 days.
2. Possible tiny amount of subchorionic hemorrhage.
3. 1.5 cm echogenic focus in left ovary. This is suspicious for a small dermoid.
Recommend special attention on followup exams.

## 2007-05-12 ENCOUNTER — Inpatient Hospital Stay (HOSPITAL_COMMUNITY): Admission: RE | Admit: 2007-05-12 | Discharge: 2007-05-12 | Payer: Self-pay | Admitting: Obstetrics & Gynecology

## 2007-05-12 IMAGING — US US OB TRANSVAGINAL
1 series · 13 of 28 positions shown · non-contrast
Comparison: [DATE].

CLINICAL DATA: Follow-up IUP.  The patient reports passage of a large clot this morning prior to this exam.  
 TRANSVAGINAL OBSTETRICAL US:
TECHNIQUE: Transvaginal ultrasound was performed for evaluation of the gestation as well as the maternal uterus and adnexal regions.

[Series 1: us ob transvaginal · 0.13mm/px · 13 of 28 slices shown]
[im 2/28]
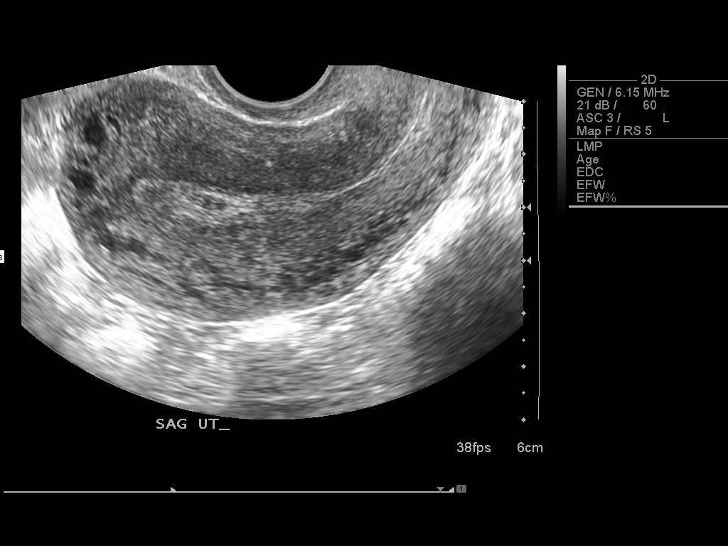
[im 4/28]
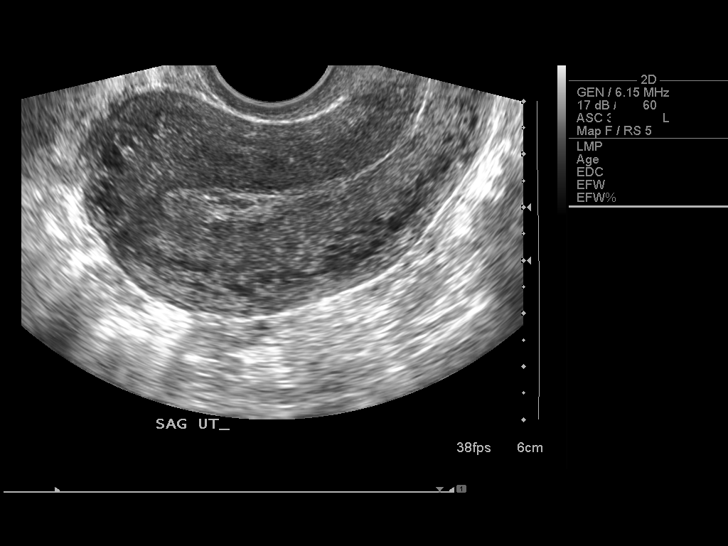
[im 6/28]
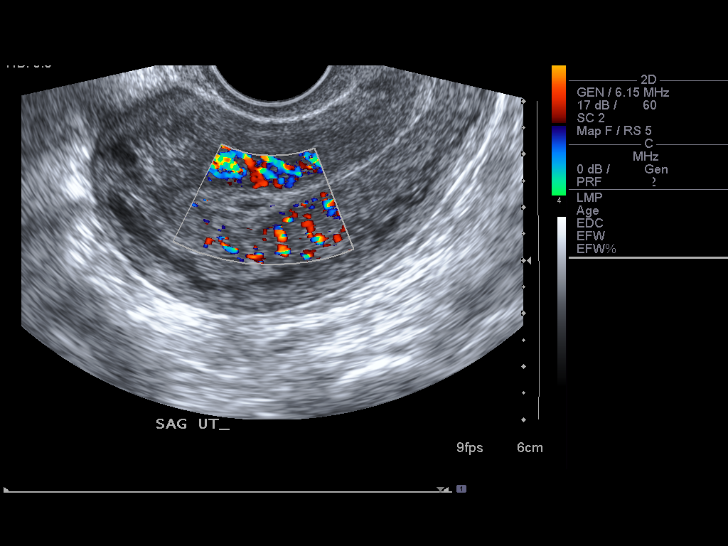
[im 8/28]
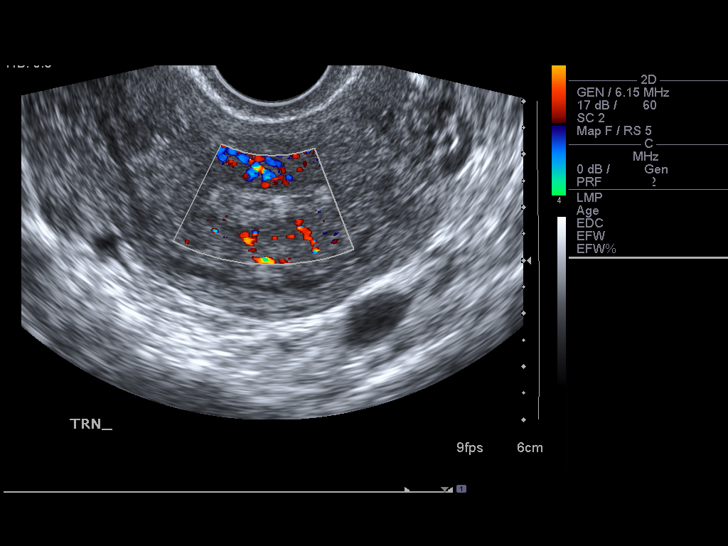
[im 10/28]
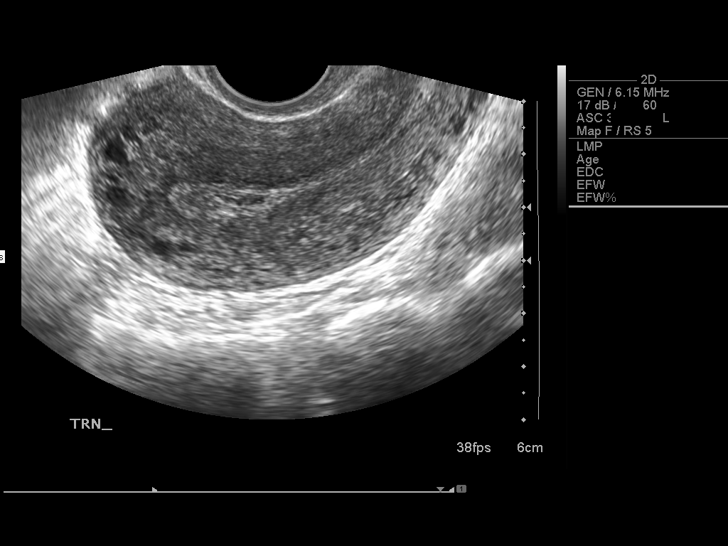
[im 12/28]
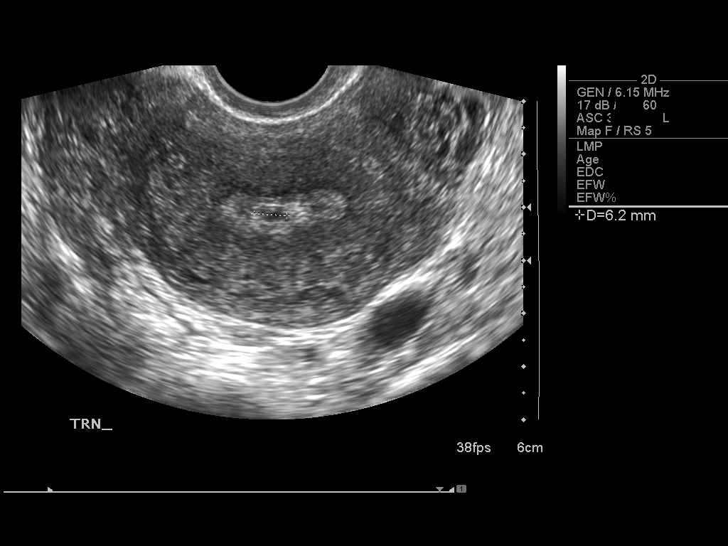
[im 15/28]
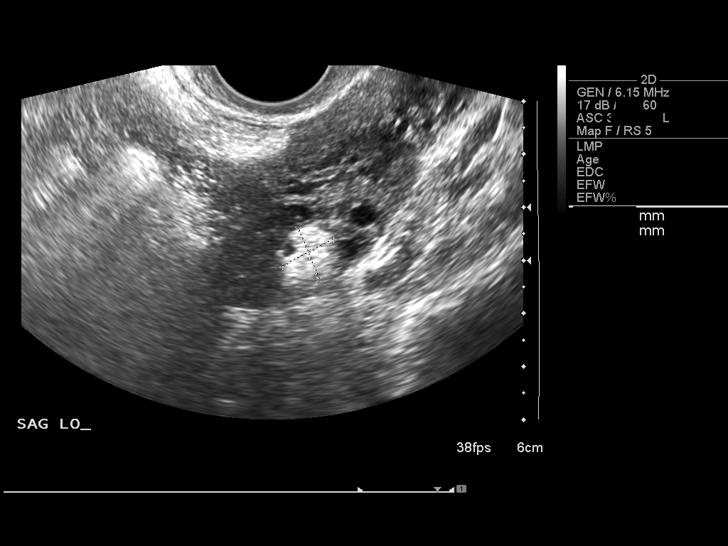
[im 17/28]
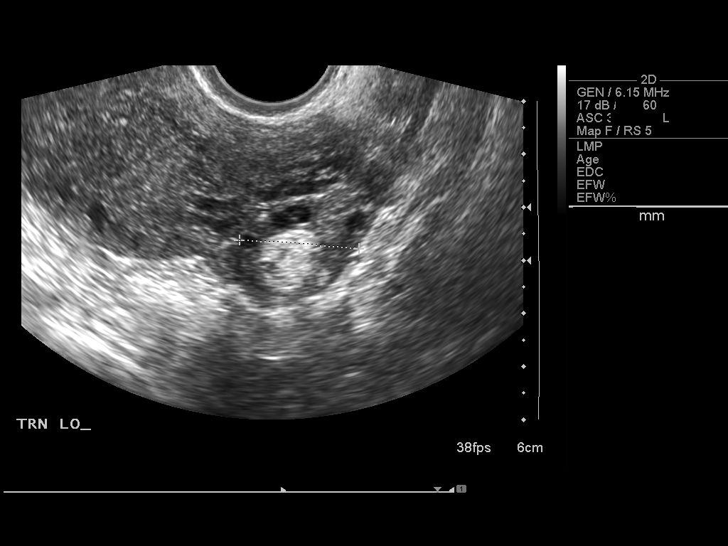
[im 19/28]
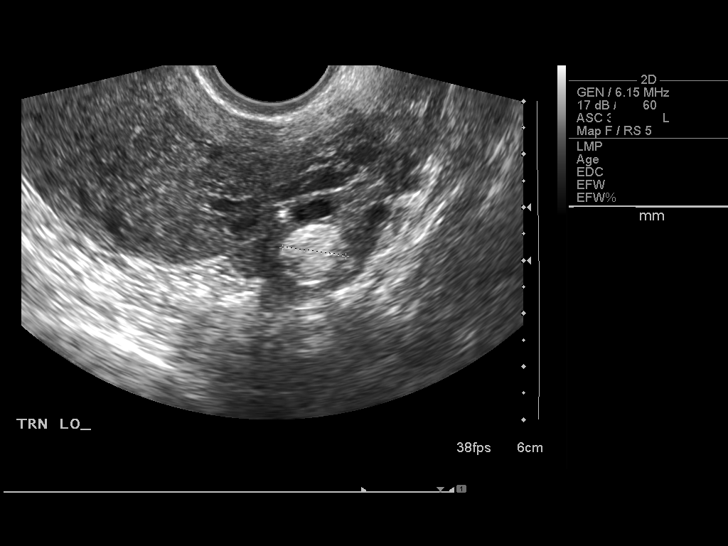
[im 21/28]
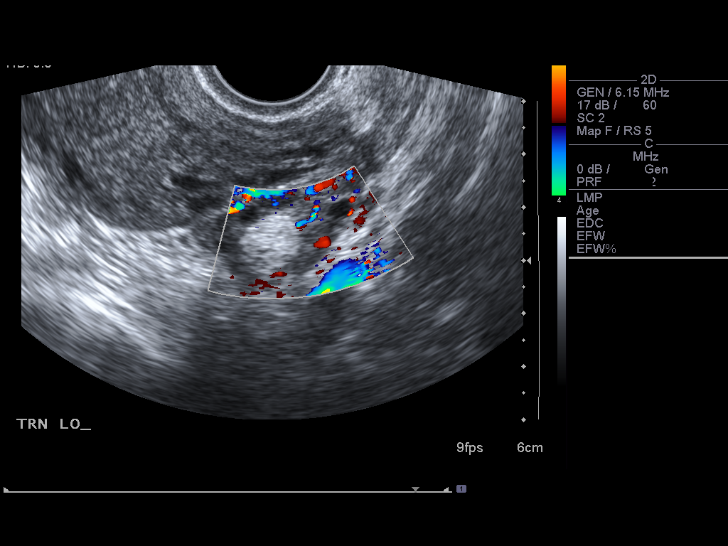
[im 23/28]
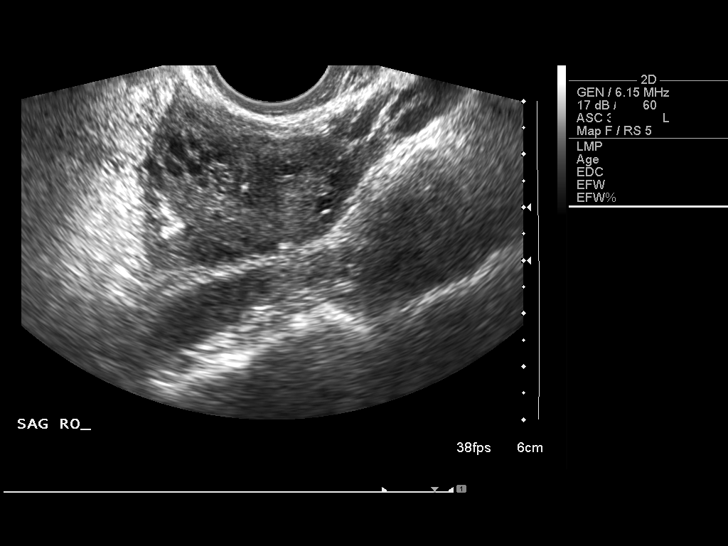
[im 25/28]
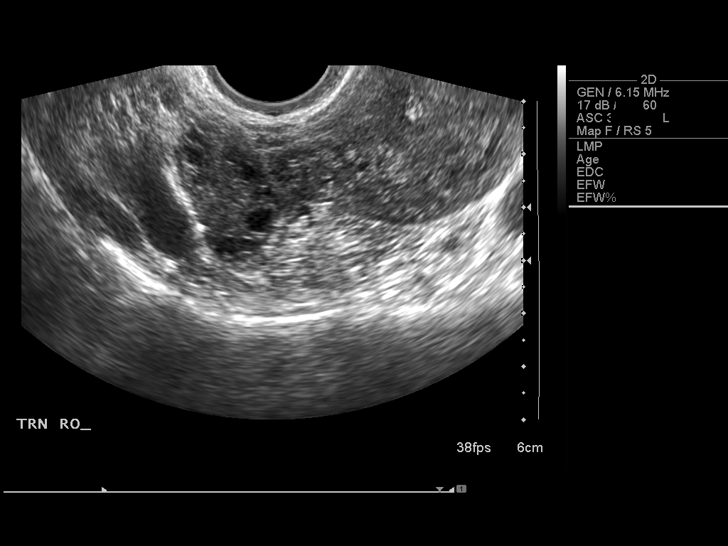
[im 27/28]
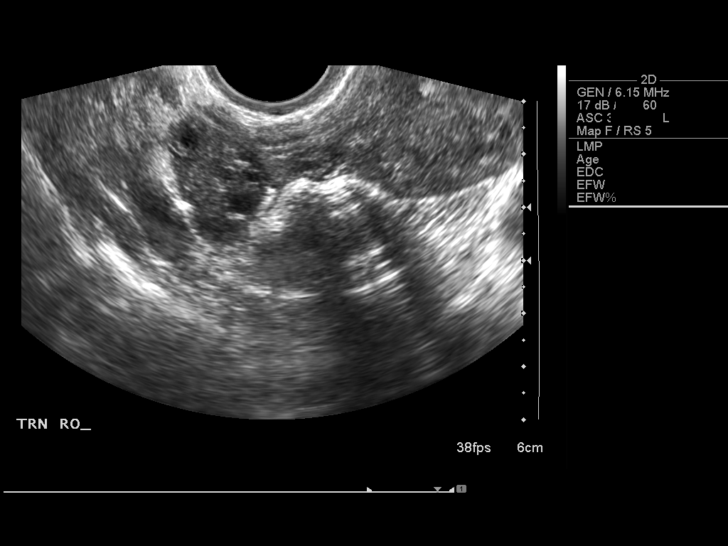

[13 of 28 positions shown; findings below may reference images not displayed]

FINDINGS: No evidence for a normal appearing gestational sac is seen today and the endometrial canal is now thinner with an AP width of 7.6 mm.  Some mild persistent inhomogeneity is identified in the upper uterine segment portion of the endometrial canal but this shows no evidence for endometrial flow and likely represents a small amount of residual blood within the endometrium.  Findings are most compatible with an interval complete spontaneous abortion.  
 Both ovaries are seen with the left ovary measuring 3.6 x 2.1 x 2.3 cm and containing a solitary echogenic focus which measures 1.1 x 1.2 x 1.2 cm and most likely represents a small focal dermoid.  The right ovary has a normal appearance measuring 1.7 x 3.6 x 1.8 cm.  No pelvic fluid or separate adnexal masses are seen.
IMPRESSION: Findings most compatible with interval near complete spontaneous abortion with a small amount of residual blood suspected within the upper uterine segment portion of the canal.

## 2007-09-05 ENCOUNTER — Inpatient Hospital Stay (HOSPITAL_COMMUNITY): Admission: AD | Admit: 2007-09-05 | Discharge: 2007-09-05 | Payer: Self-pay | Admitting: Obstetrics & Gynecology

## 2007-09-05 IMAGING — US US OB COMP LESS 14 WK
1 series · 14 of 22 positions shown · non-contrast
Comparison: none

CLINICAL DATA: Positive pregnancy test, abdominal and pelvic pain.  
 OBSTETRICAL ULTRASOUND <14 WKS AND TRANSVAGINAL OB US:
TECHNIQUE: Both transabdominal and transvaginal ultrasound examinations were performed for complete evaluation of the gestation as well as the maternal uterus, adnexal regions, and pelvic cul-de-sac.

[Series 1: us ob comp less 14 wks · 14 of 22 slices shown]
[im 1/22]
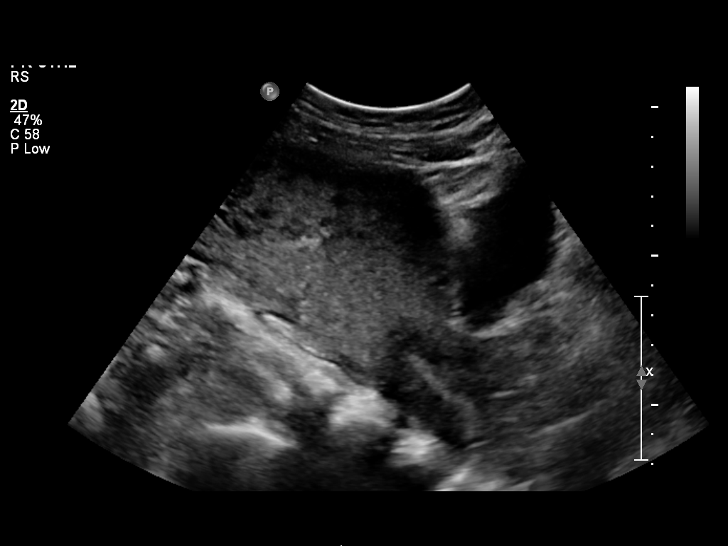
[im 3/22]
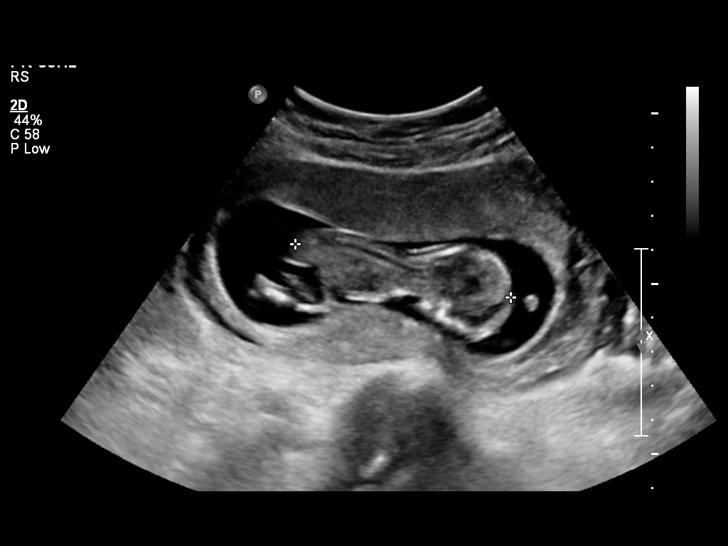
[im 4/22]
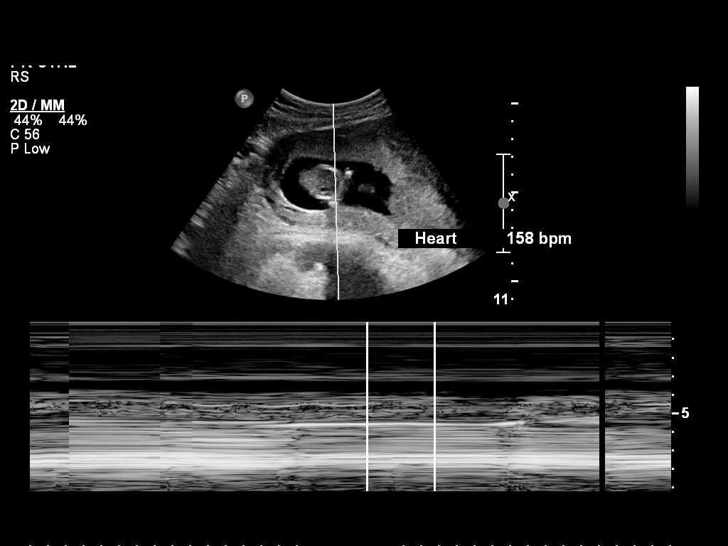
[im 6/22]
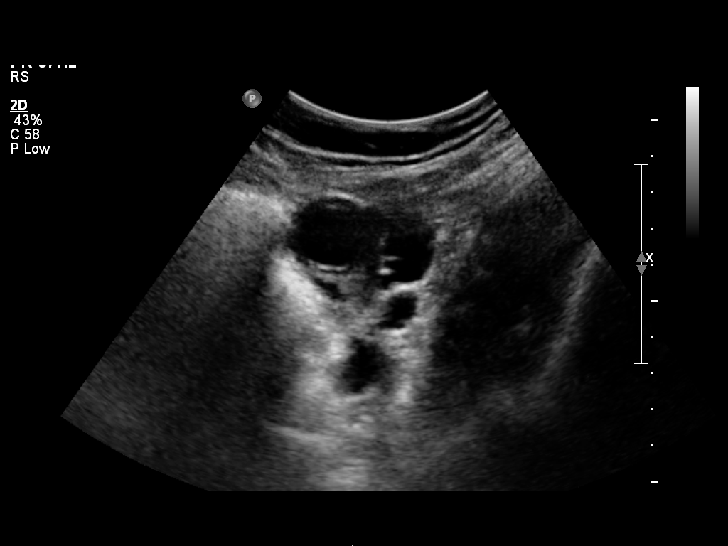
[im 8/22]
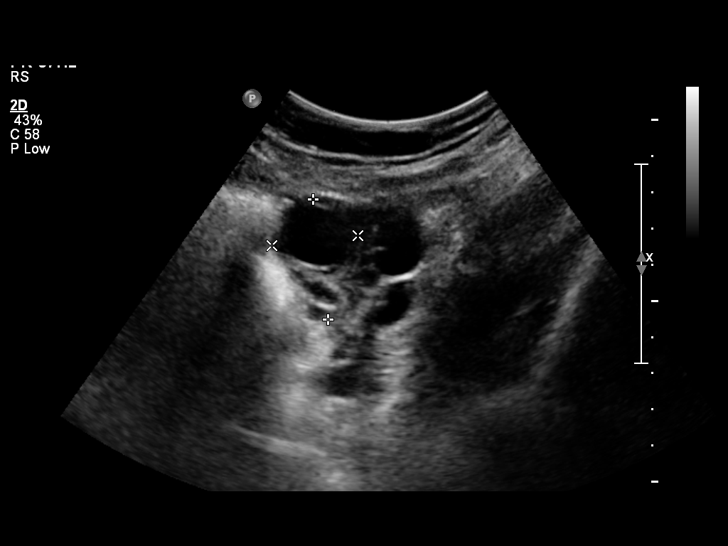
[im 9/22]
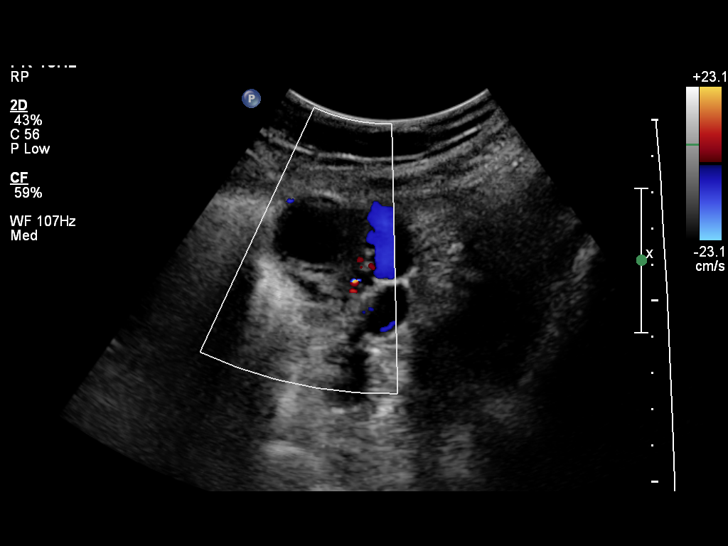
[im 11/22]
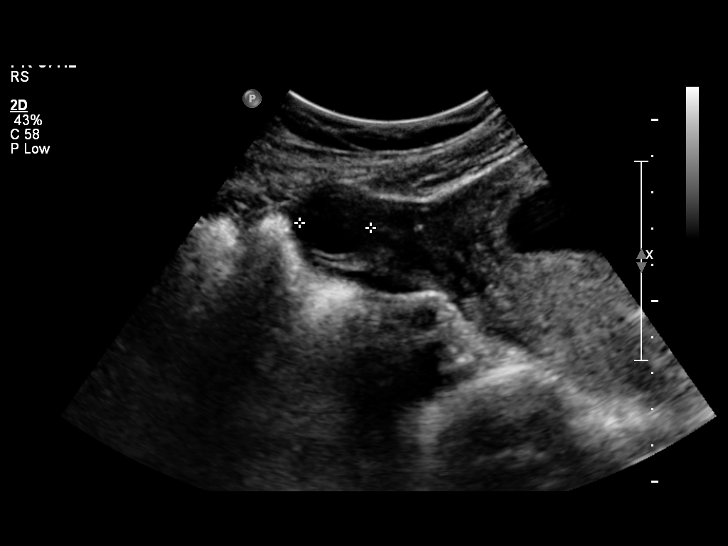
[im 12/22]
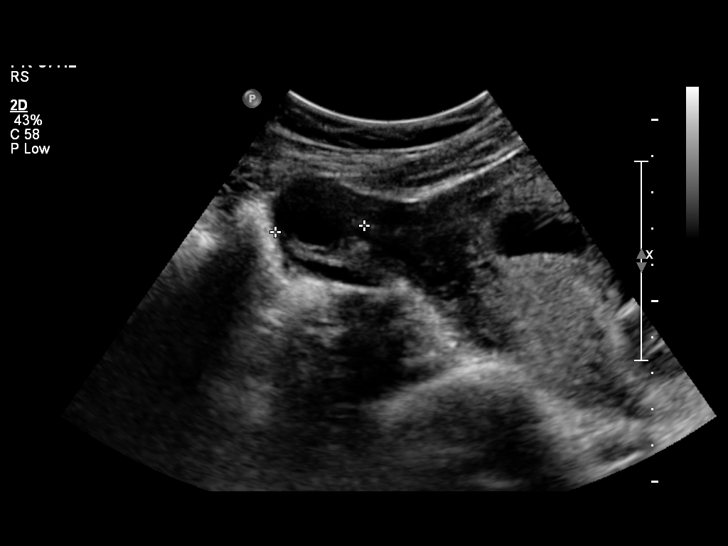
[im 14/22]
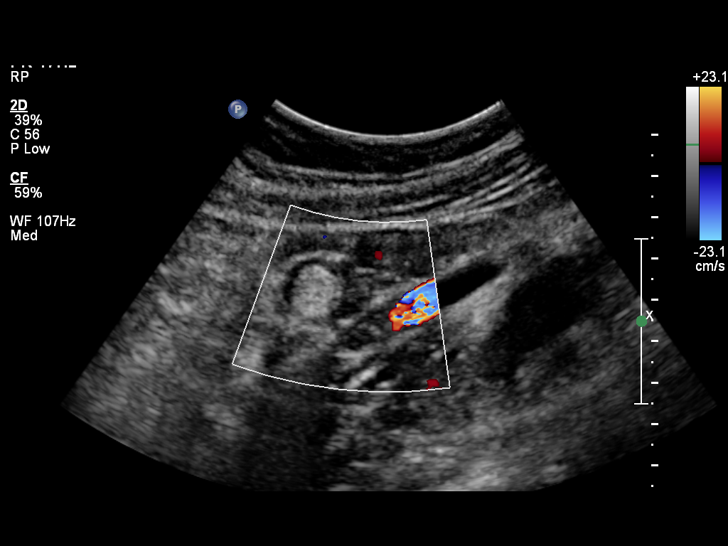
[im 15/22]
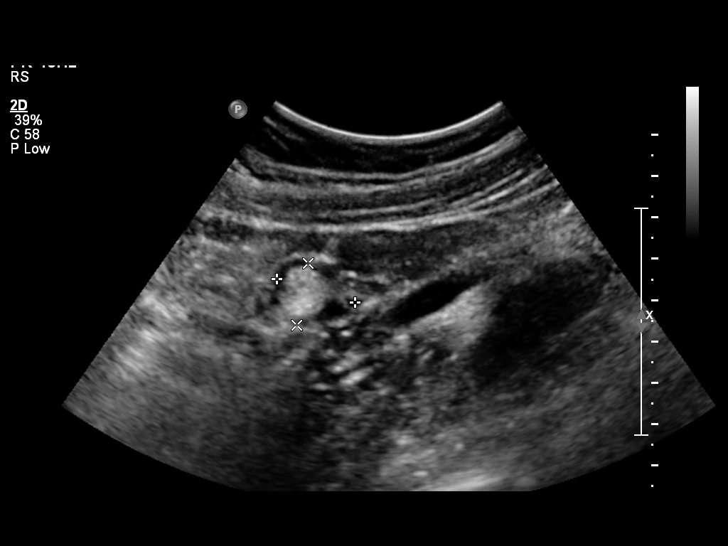
[im 17/22]
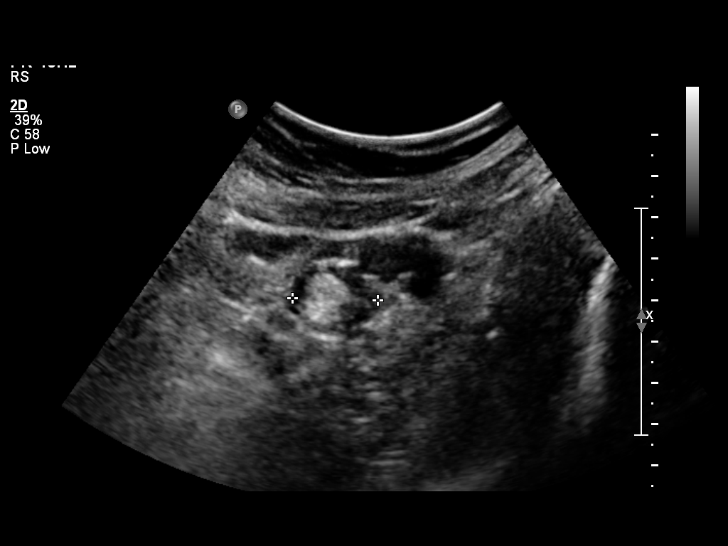
[im 19/22]
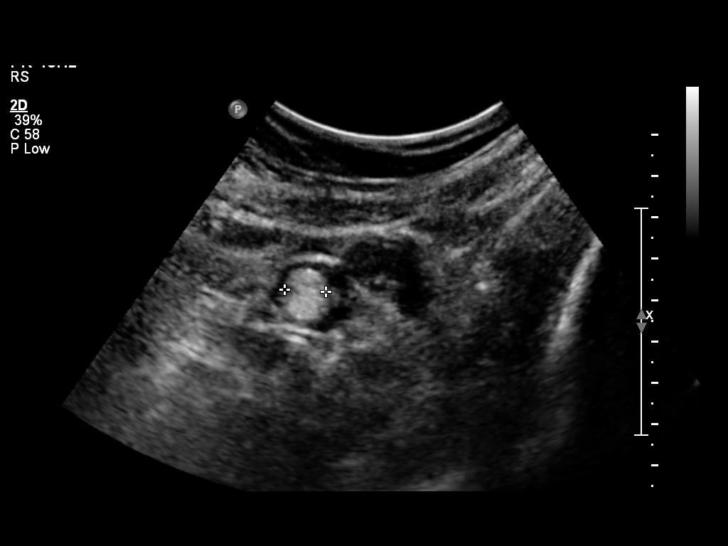
[im 20/22]
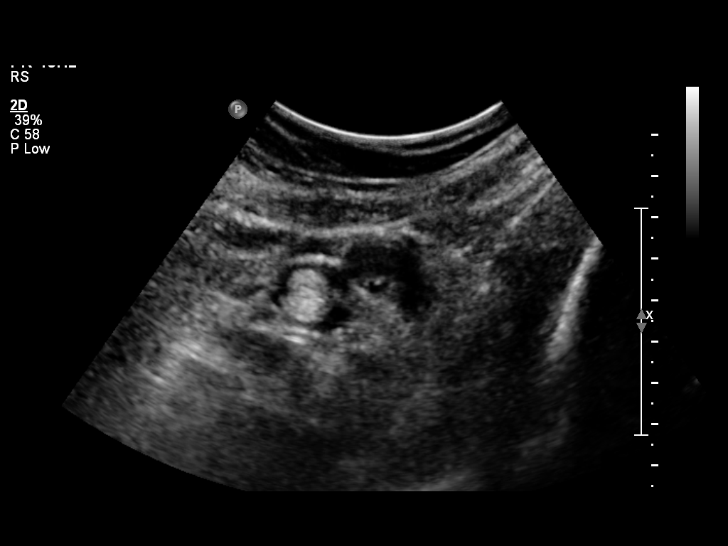
[im 22/22]
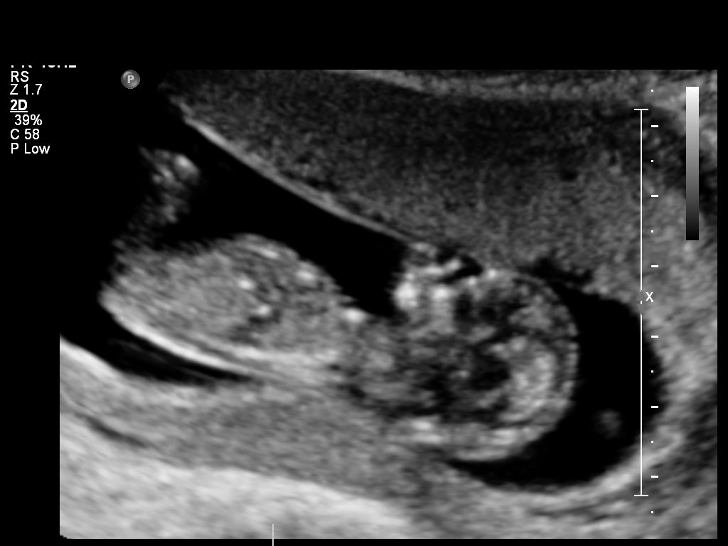

[14 of 22 positions shown; findings below may reference images not displayed]

FINDINGS: There is an intrauterine gestational sac with fetal pole, yolk sac, and cardiac activity noted.  Crown-rump length measures 6.3 cm corresponding to 12 weeks 6 days gestational age.  Heart rate 168 bpm.  Right ovary is unremarkable.  Left ovary contains an echogenic area measuring 1.3 x 1.0 x 0.9 cm.
IMPRESSION: 1.  Single living intrauterine gestation with average ultrasound age by crown-rump length of 12 weeks 6 days, EDC [DATE].  
 2.  Echogenic area, left ovary, may represent a dermoid.

## 2007-09-29 ENCOUNTER — Inpatient Hospital Stay (HOSPITAL_COMMUNITY): Admission: AD | Admit: 2007-09-29 | Discharge: 2007-09-29 | Payer: Self-pay | Admitting: Family Medicine

## 2007-10-31 ENCOUNTER — Ambulatory Visit: Payer: Self-pay | Admitting: Obstetrics and Gynecology

## 2007-10-31 ENCOUNTER — Inpatient Hospital Stay (HOSPITAL_COMMUNITY): Admission: AD | Admit: 2007-10-31 | Discharge: 2007-10-31 | Payer: Self-pay | Admitting: Obstetrics & Gynecology

## 2007-10-31 IMAGING — US US OB COMP +14 WK
2 series · 14 of 28 positions shown · non-contrast
Comparison: none

OBSTETRICAL ULTRASOUND:

 This ultrasound exam was performed in the [HOSPITAL] Ultrasound Department.  The OB US report was generated in the AS system, and faxed to the ordering physician.  This report is also available in [REDACTED] PACS.

[Series 1: us ob comp +14 wk · 13 of 83 slices shown (1 of 2)]
[im 4/83]
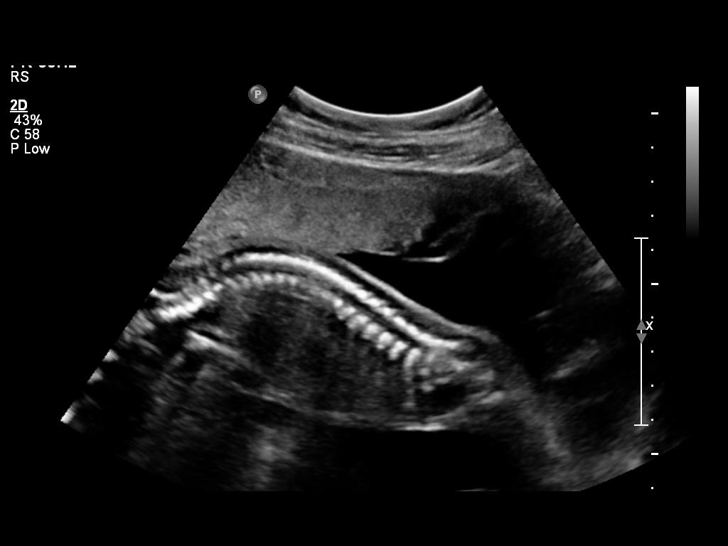
[im 10/83]
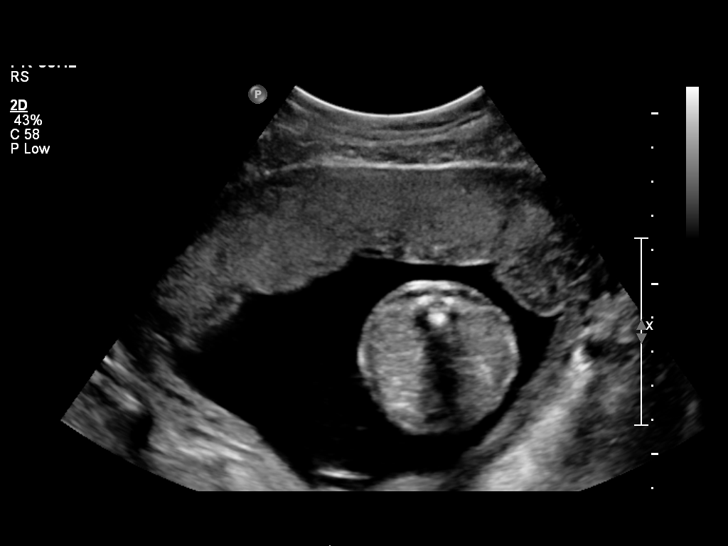
[im 16/83]
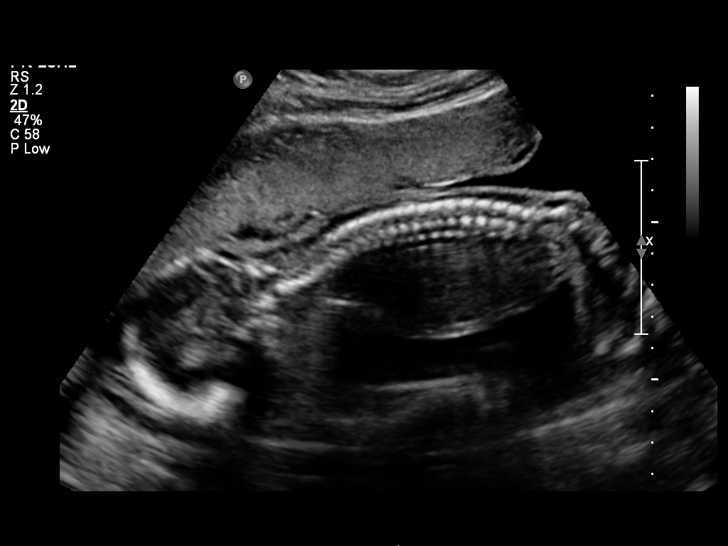
[im 23/83]
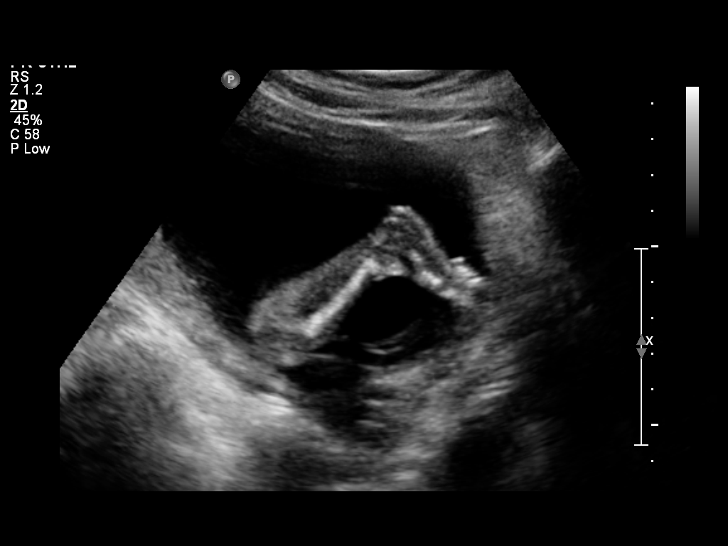
[im 29/83]
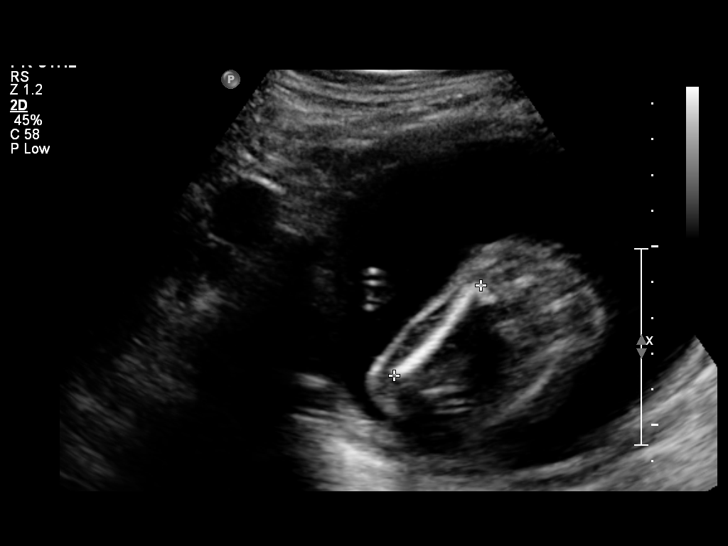
[im 35/83]
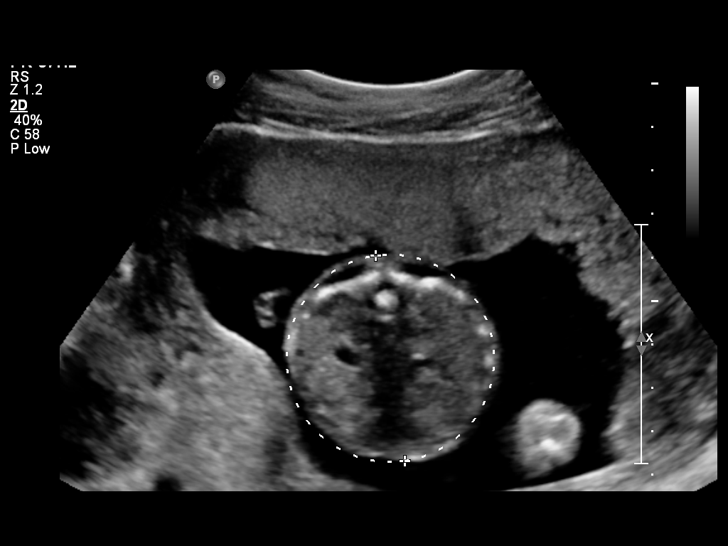
[im 42/83]
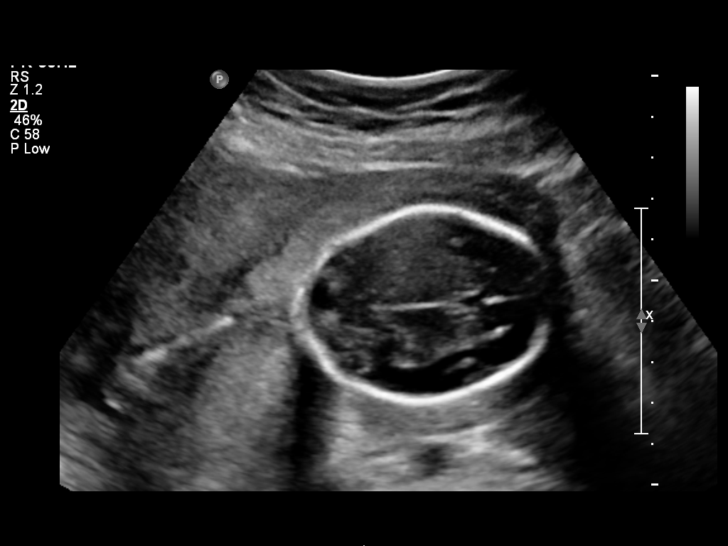
[im 48/83]
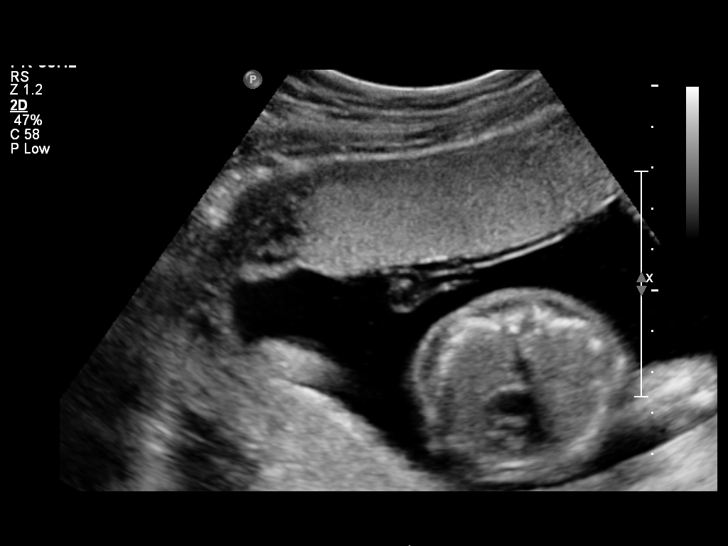
[im 54/83]
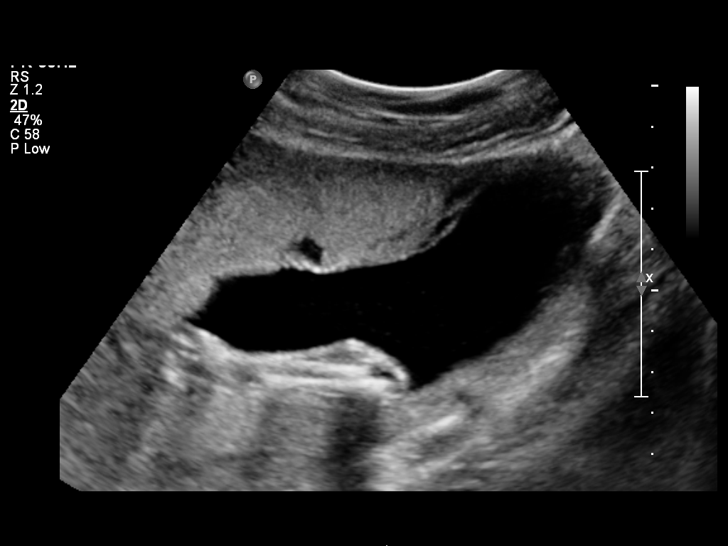
[im 60/83]
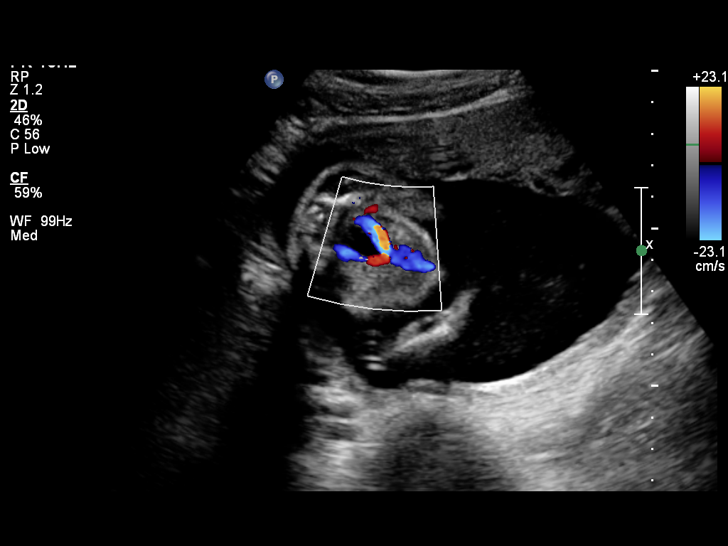
[im 67/83]
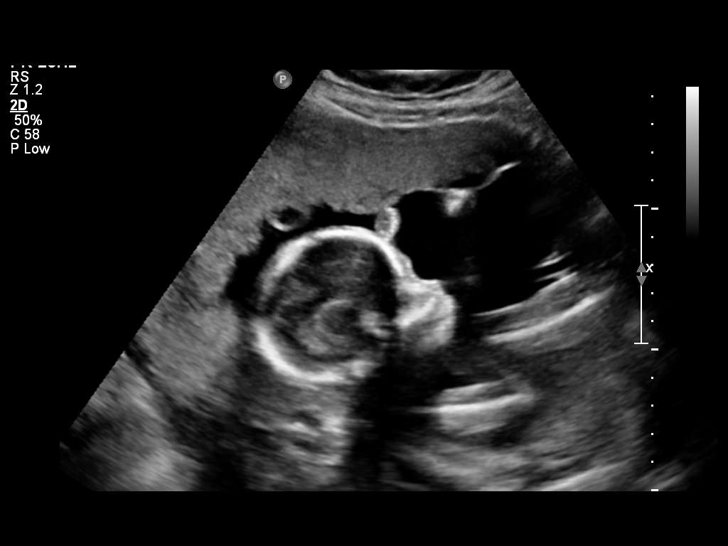
[im 73/83]
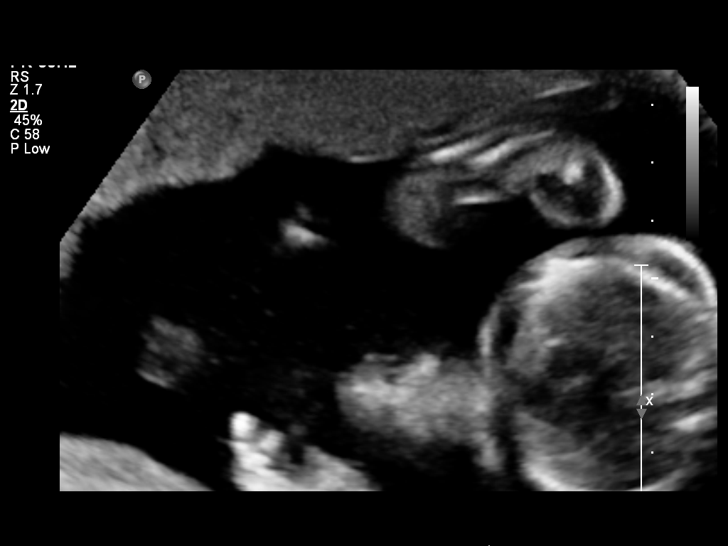
[im 79/83]
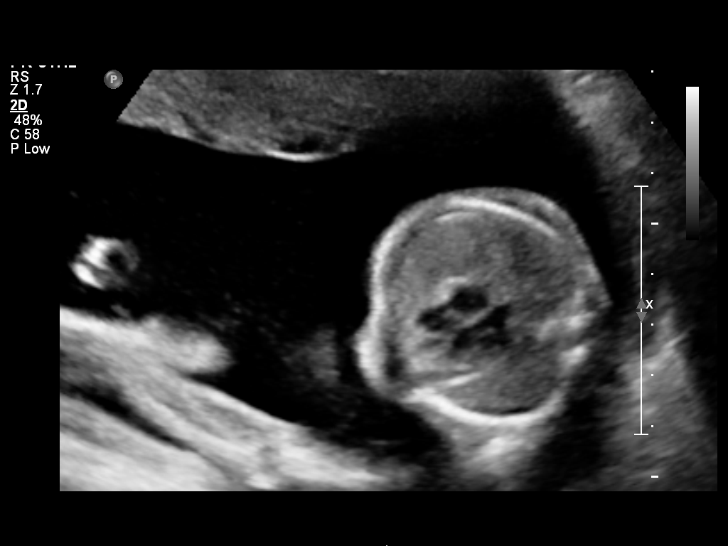

[Series 1: us ob comp +14 wk · 1 of 1 slices shown (2 of 2)]
[im 1/1]
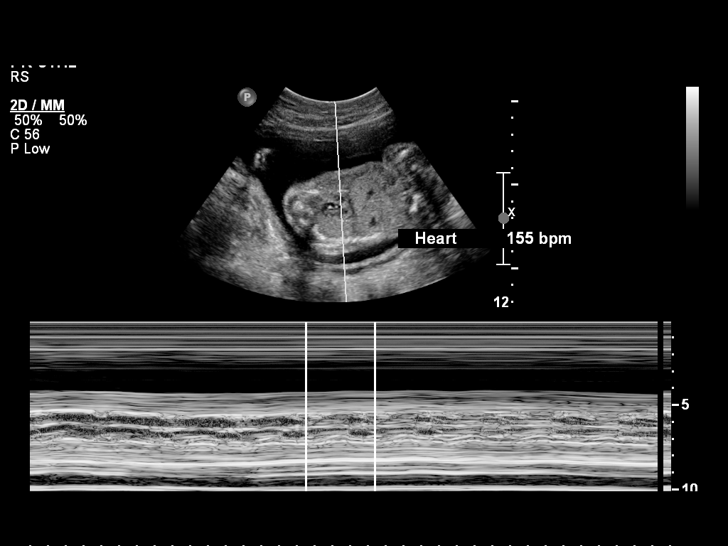

[14 of 28 positions shown; findings below may reference images not displayed]

IMPRESSION: See AS Obstetric US report.

## 2007-12-05 ENCOUNTER — Emergency Department (HOSPITAL_COMMUNITY): Admission: EM | Admit: 2007-12-05 | Discharge: 2007-12-05 | Payer: Self-pay | Admitting: Emergency Medicine

## 2007-12-21 ENCOUNTER — Ambulatory Visit: Payer: Self-pay | Admitting: Obstetrics & Gynecology

## 2007-12-21 ENCOUNTER — Encounter: Payer: Self-pay | Admitting: Obstetrics and Gynecology

## 2007-12-26 ENCOUNTER — Inpatient Hospital Stay (HOSPITAL_COMMUNITY): Admission: AD | Admit: 2007-12-26 | Discharge: 2007-12-27 | Payer: Self-pay | Admitting: Obstetrics and Gynecology

## 2007-12-26 ENCOUNTER — Encounter: Admission: RE | Admit: 2007-12-26 | Discharge: 2007-12-26 | Payer: Self-pay | Admitting: Obstetrics & Gynecology

## 2007-12-26 ENCOUNTER — Ambulatory Visit: Payer: Self-pay | Admitting: Obstetrics & Gynecology

## 2007-12-26 ENCOUNTER — Ambulatory Visit (HOSPITAL_COMMUNITY): Admission: RE | Admit: 2007-12-26 | Discharge: 2007-12-26 | Payer: Self-pay | Admitting: Obstetrics and Gynecology

## 2007-12-26 ENCOUNTER — Ambulatory Visit: Payer: Self-pay | Admitting: Obstetrics and Gynecology

## 2007-12-26 IMAGING — US US OB FOLLOW-UP
1 series · 14 of 28 positions shown · non-contrast
Comparison: none

OBSTETRICAL ULTRASOUND:

 This ultrasound exam was performed in the [HOSPITAL] Ultrasound Department.  The OB US report was generated in the AS system, and faxed to the ordering physician.  This report is also available in [REDACTED] PACS.

[Series 1: us ob follow-up · 0.22mm/px · 14 of 30 slices shown]
[im 2/30]
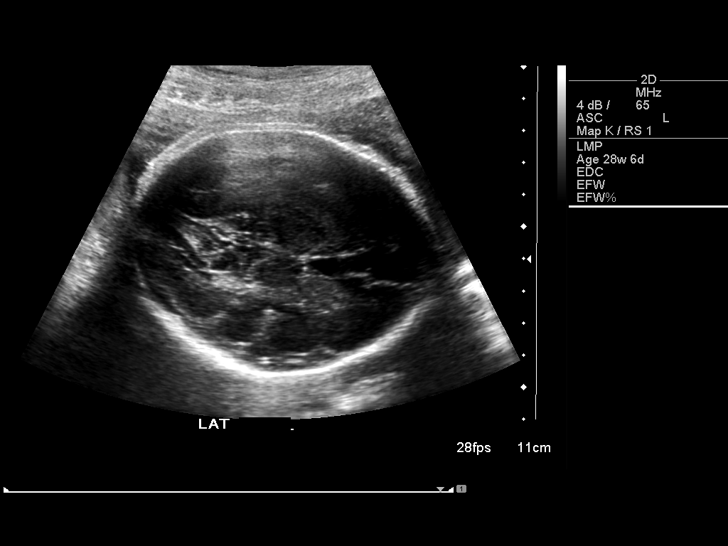
[im 4/30]
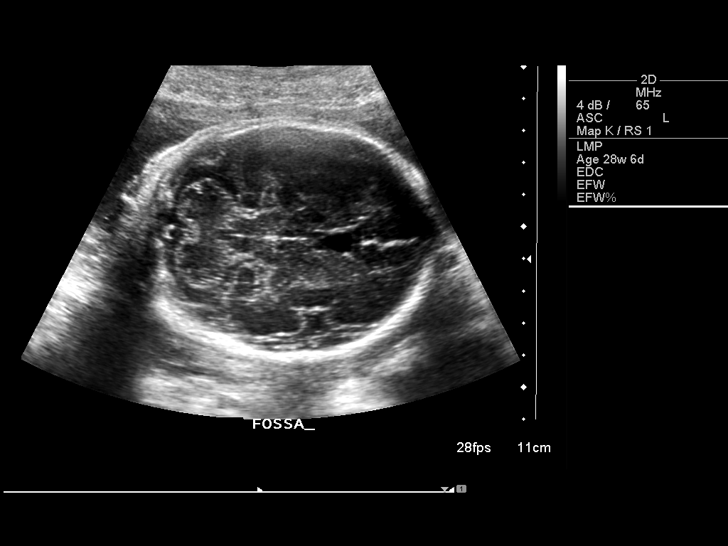
[im 6/30]
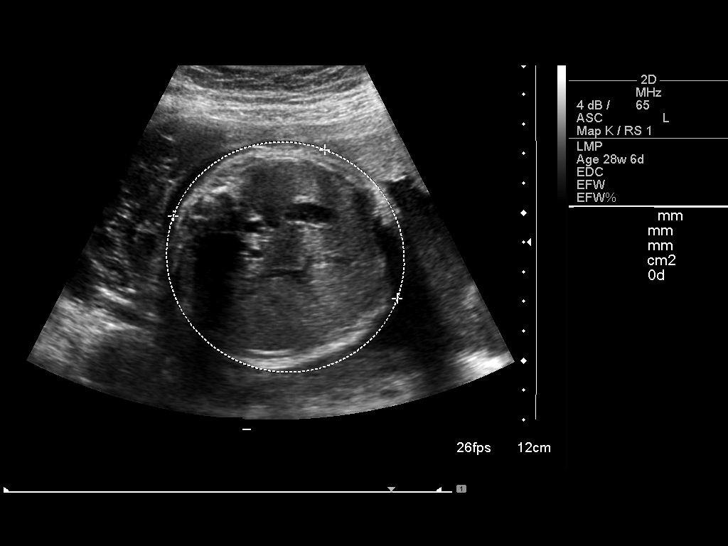
[im 8/30]
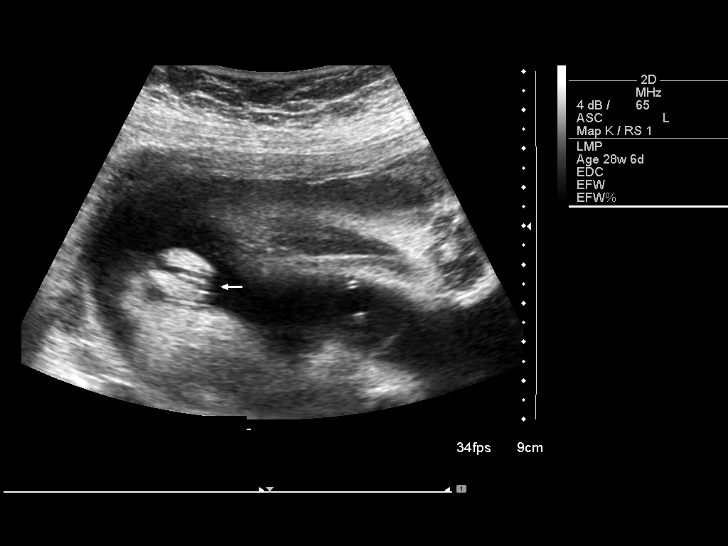
[im 10/30]
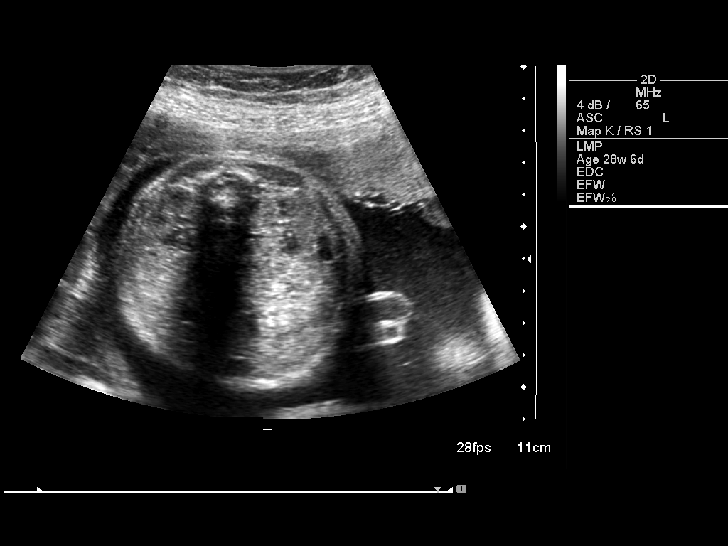
[im 12/30]
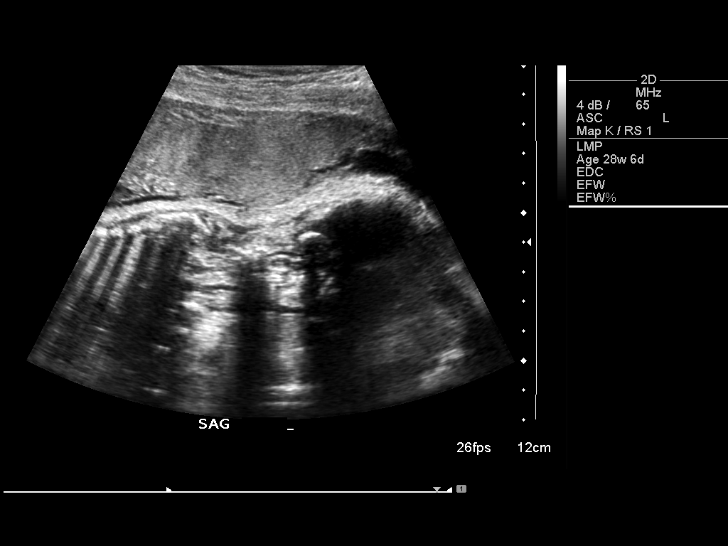
[im 14/30]
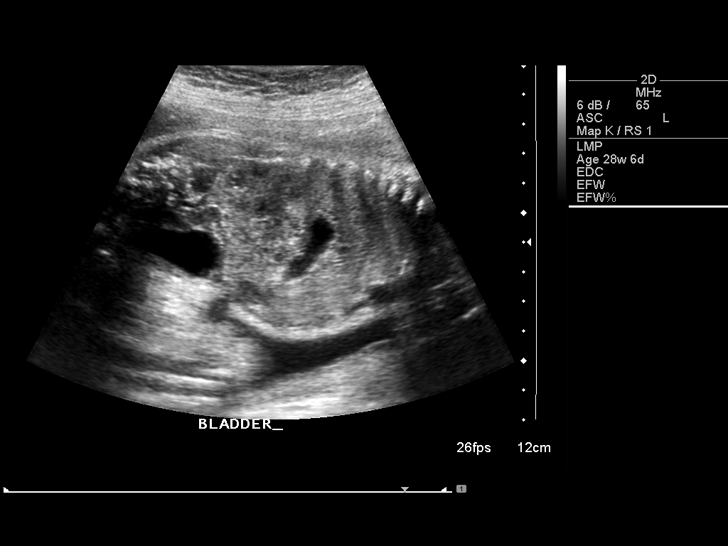
[im 17/30]
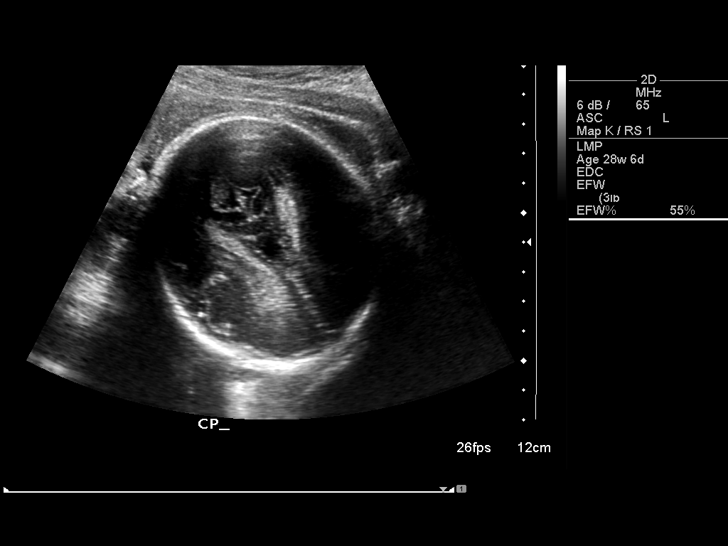
[im 19/30]
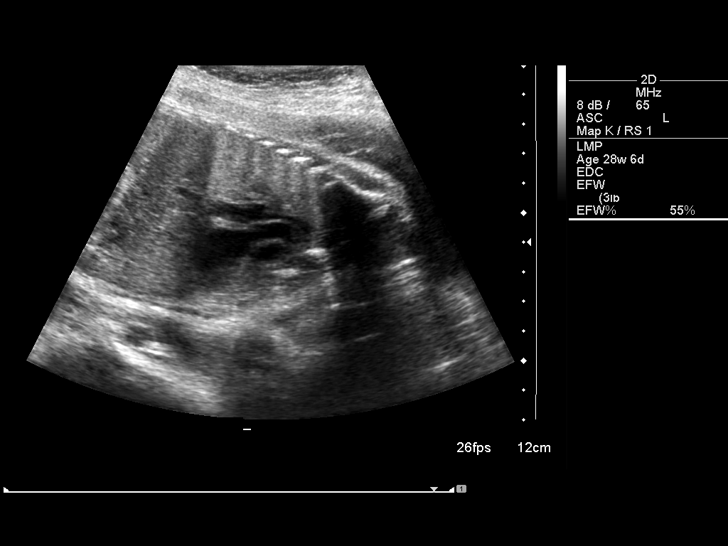
[im 21/30]
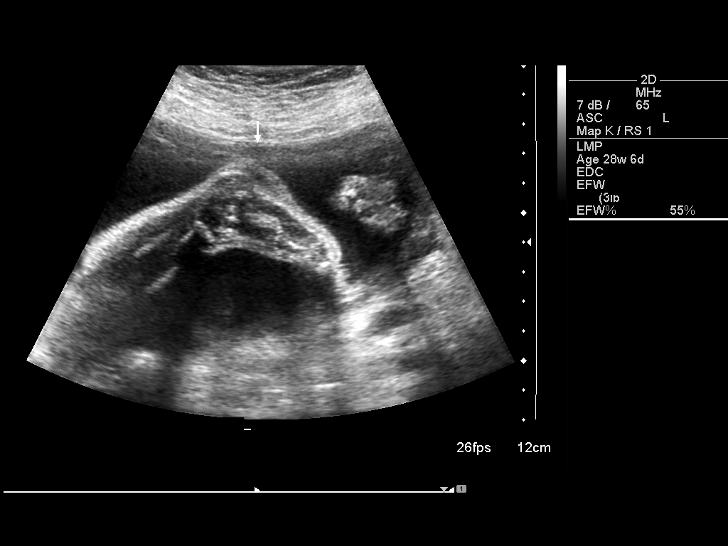
[im 23/30]
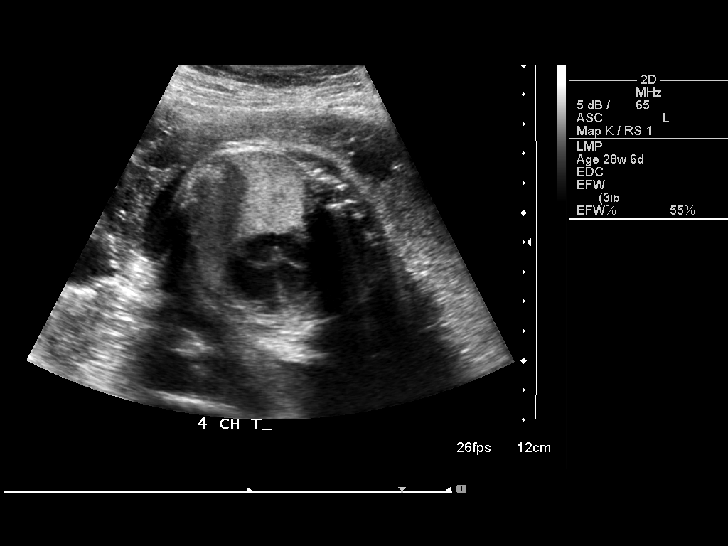
[im 25/30]
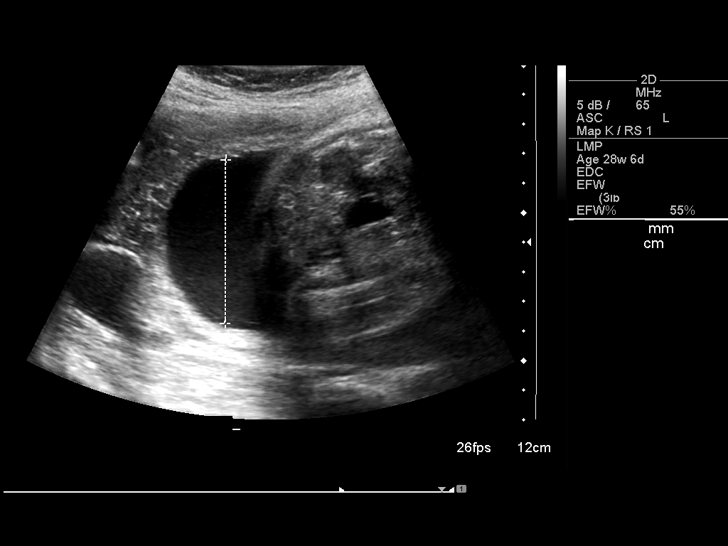
[im 27/30]
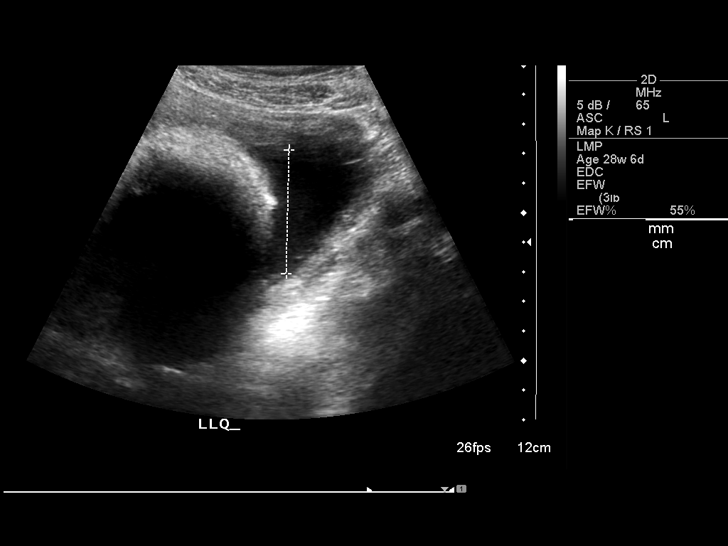
[im 30/30]
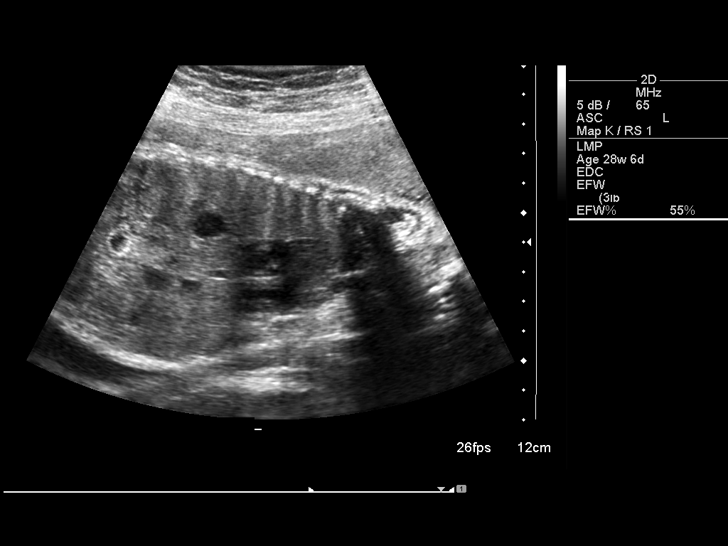

[14 of 28 positions shown; findings below may reference images not displayed]

IMPRESSION: See AS Obstetric US report.

## 2008-01-05 ENCOUNTER — Ambulatory Visit: Payer: Self-pay | Admitting: Obstetrics & Gynecology

## 2008-01-09 ENCOUNTER — Ambulatory Visit: Payer: Self-pay | Admitting: Hematology and Oncology

## 2008-01-16 ENCOUNTER — Ambulatory Visit: Payer: Self-pay | Admitting: Family Medicine

## 2008-01-19 ENCOUNTER — Encounter: Payer: Self-pay | Admitting: *Deleted

## 2008-01-19 ENCOUNTER — Inpatient Hospital Stay (HOSPITAL_COMMUNITY): Admission: AD | Admit: 2008-01-19 | Discharge: 2008-01-19 | Payer: Self-pay | Admitting: Obstetrics & Gynecology

## 2008-01-19 IMAGING — US US OB FOLLOW-UP
1 series · 14 of 28 positions shown · non-contrast
Comparison: none

OBSTETRICAL ULTRASOUND:
 This ultrasound was performed in The [HOSPITAL], and the AS OB/GYN report will be stored to [REDACTED] PACS.

[Series 1: us ob follow-up · 14 of 31 slices shown]
[im 2/31]
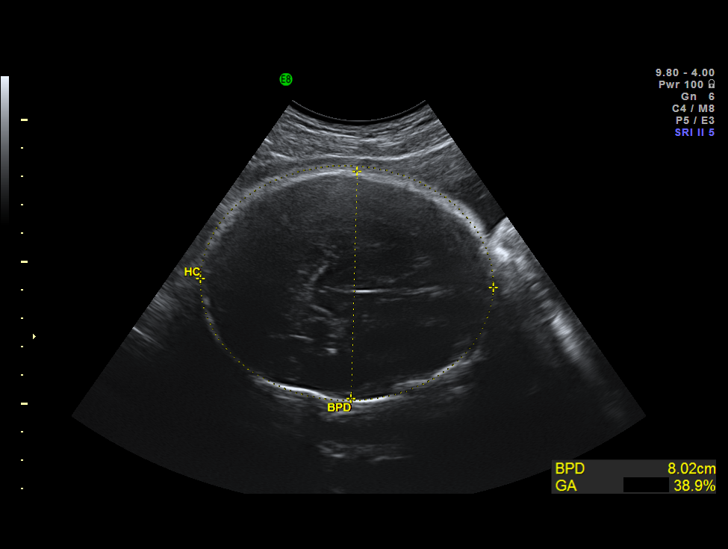
[im 4/31]
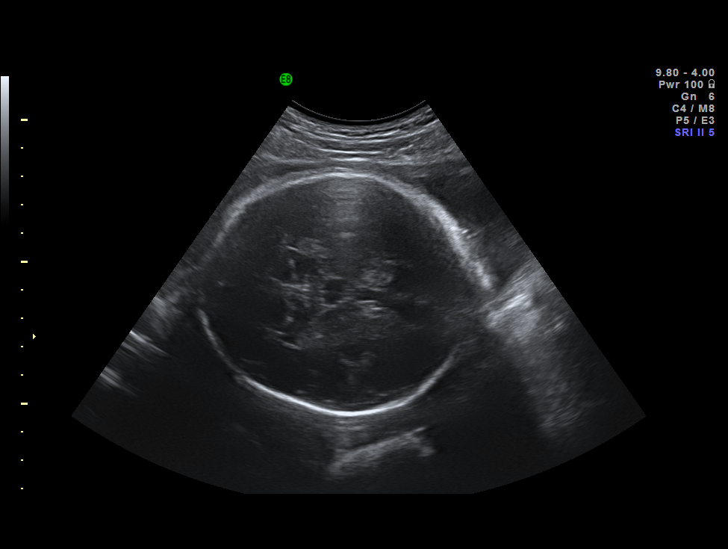
[im 6/31]
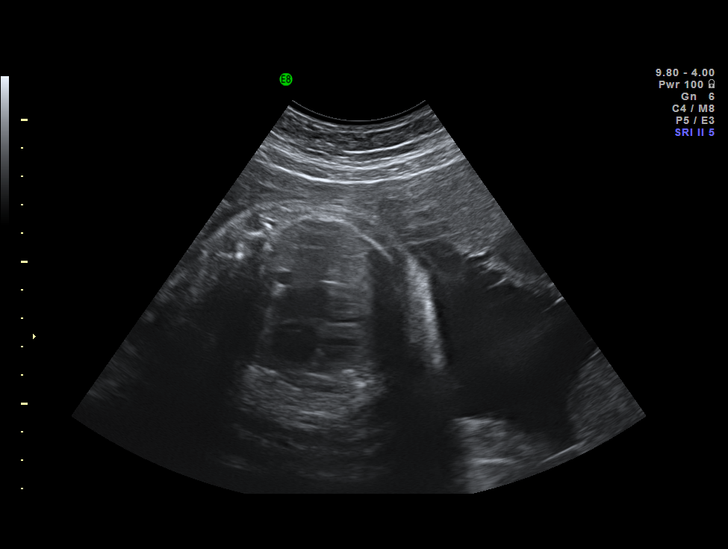
[im 8/31]
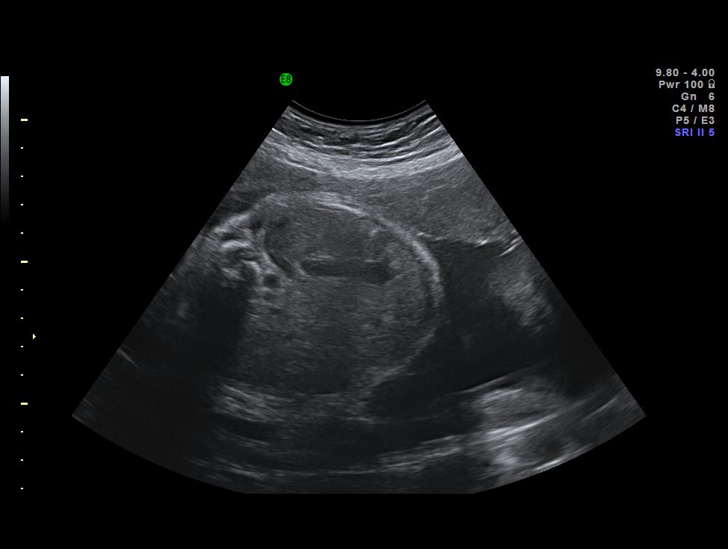
[im 11/31]
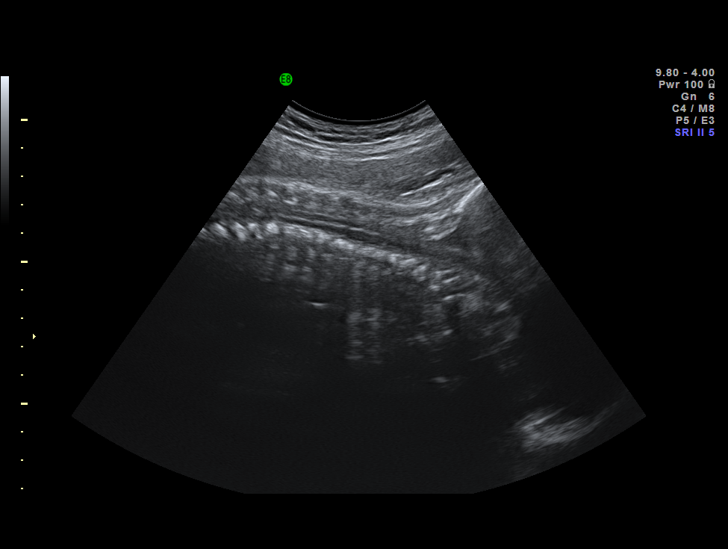
[im 13/31]
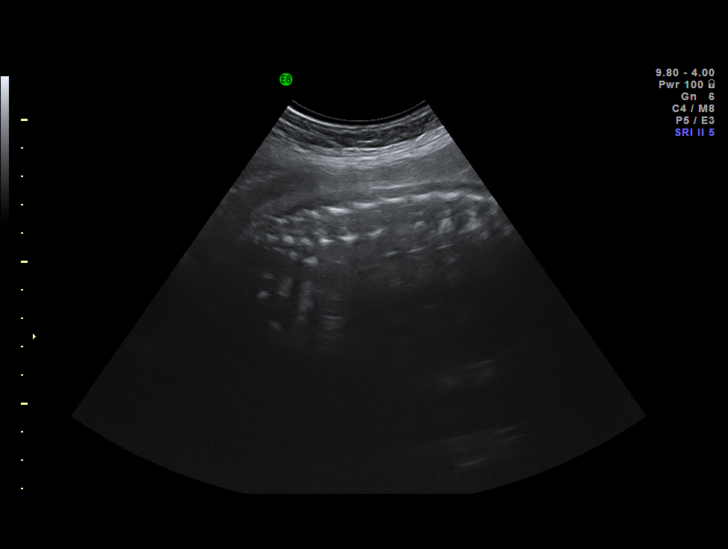
[im 15/31]
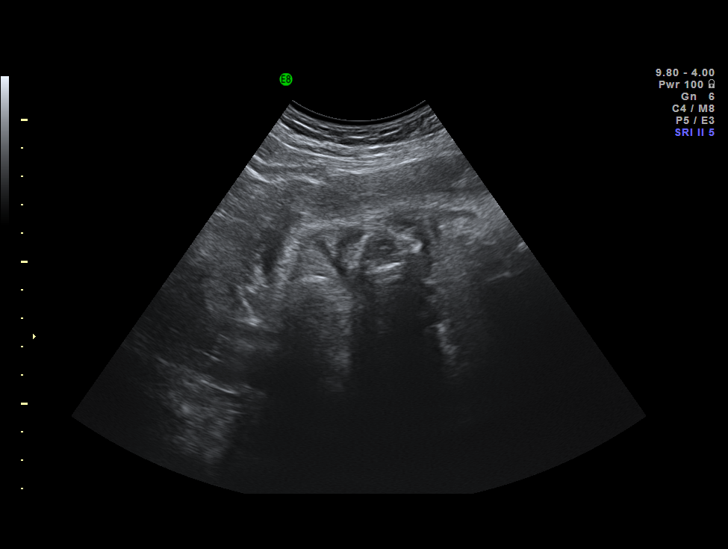
[im 17/31]
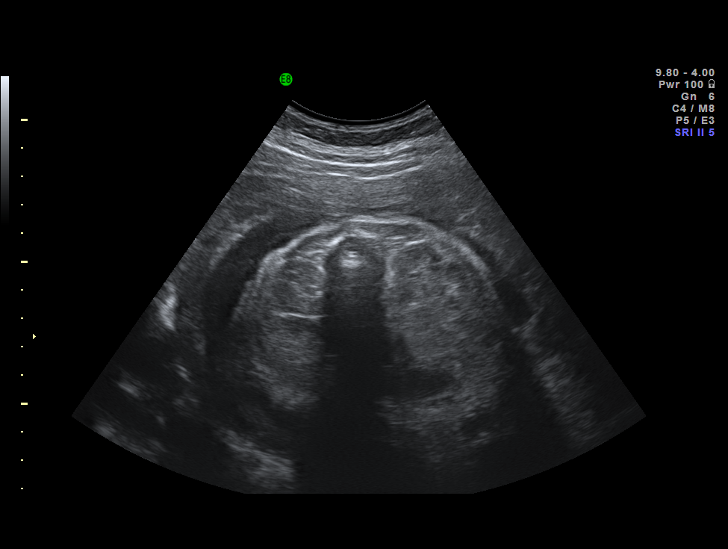
[im 19/31]
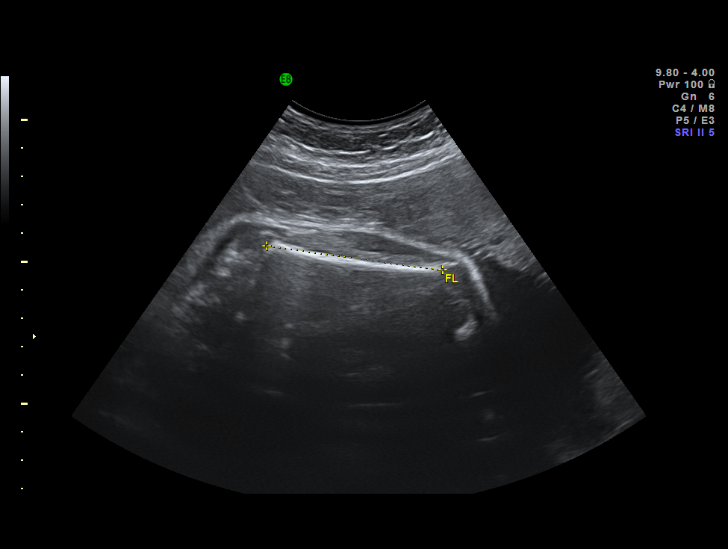
[im 22/31]
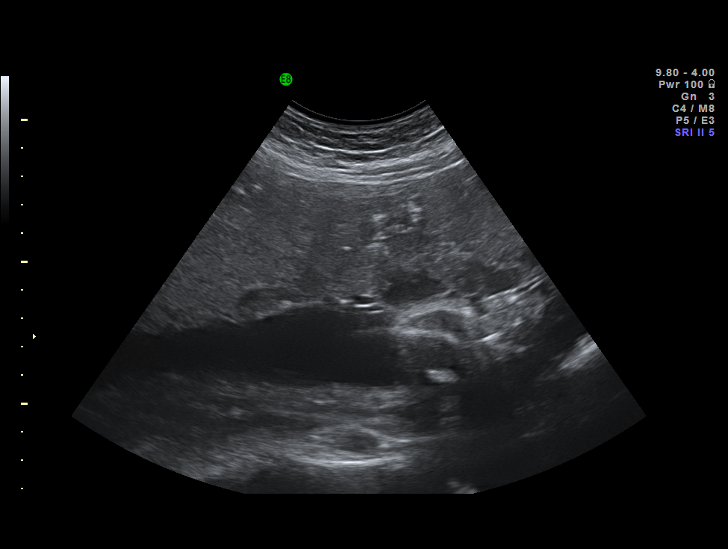
[im 24/31]
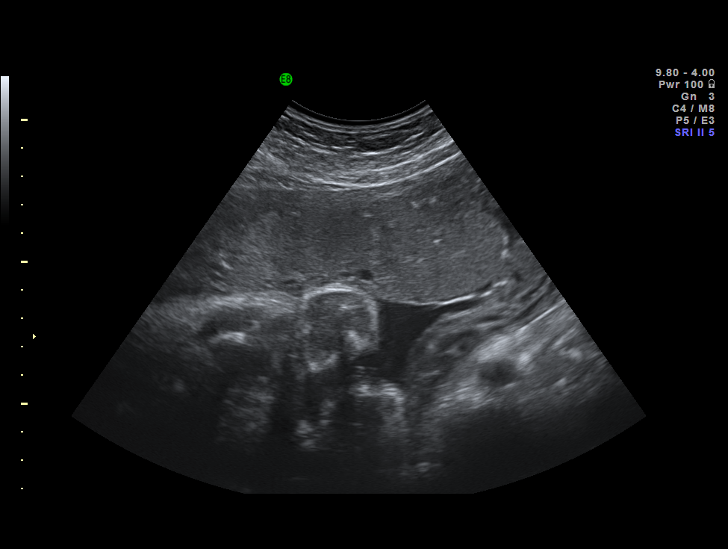
[im 26/31]
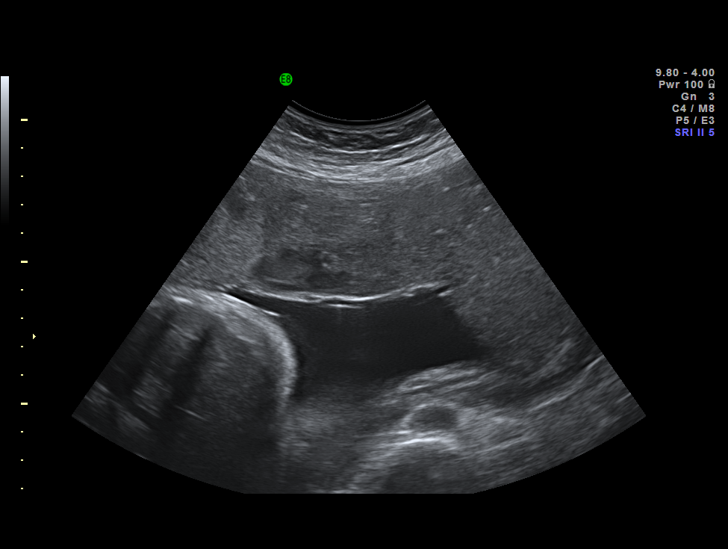
[im 28/31]
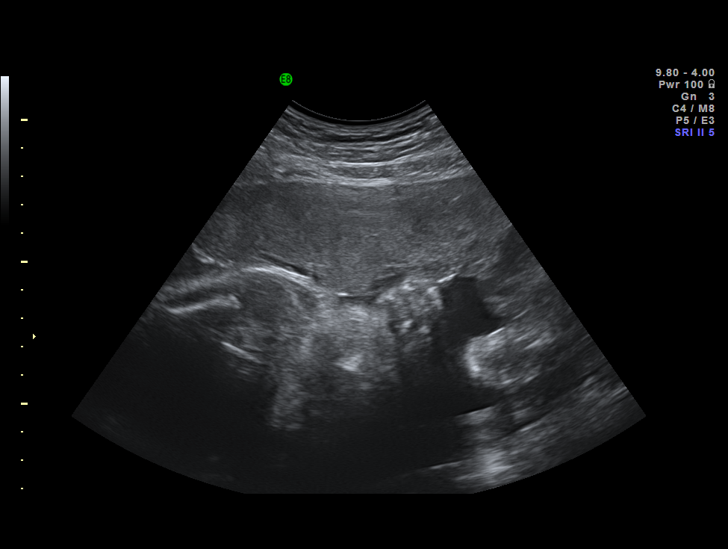
[im 31/31]
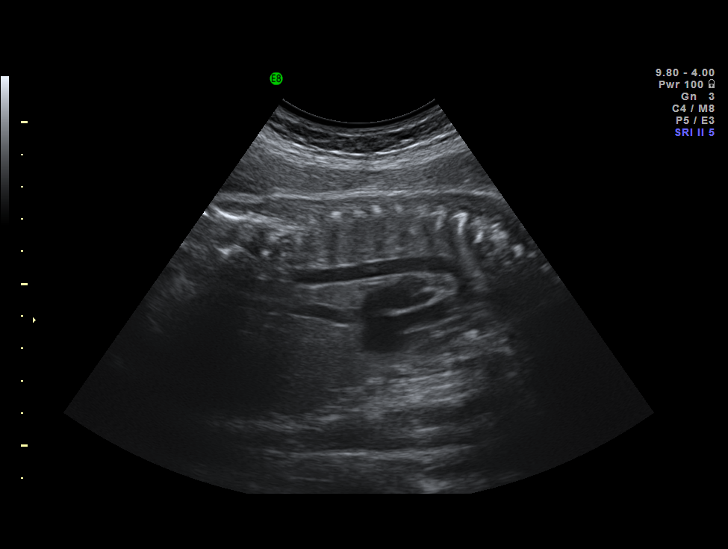

[14 of 28 positions shown; findings below may reference images not displayed]

IMPRESSION: The AS OB/GYN report has also been faxed to the ordering physician.

## 2008-01-26 ENCOUNTER — Ambulatory Visit (HOSPITAL_COMMUNITY): Admission: RE | Admit: 2008-01-26 | Discharge: 2008-01-26 | Payer: Self-pay | Admitting: Obstetrics & Gynecology

## 2008-01-26 LAB — CBC WITH DIFFERENTIAL/PLATELET
Eosinophils Absolute: 0 10*3/uL (ref 0.0–0.5)
LYMPH%: 16.2 % (ref 14.0–48.0)
MCHC: 34.3 g/dL (ref 32.0–36.0)
MCV: 84.2 fL (ref 81.0–101.0)
MONO%: 5 % (ref 0.0–13.0)
Platelets: 90 10*3/uL — ABNORMAL LOW (ref 145–400)
RBC: 3.76 10*6/uL (ref 3.70–5.32)

## 2008-01-26 LAB — COMPREHENSIVE METABOLIC PANEL
Alkaline Phosphatase: 81 U/L (ref 39–117)
Glucose, Bld: 76 mg/dL (ref 70–99)
Sodium: 137 mEq/L (ref 135–145)
Total Bilirubin: 0.4 mg/dL (ref 0.3–1.2)
Total Protein: 6.2 g/dL (ref 6.0–8.3)

## 2008-01-26 IMAGING — US US FETAL BPP W/O NONSTRESS
1 series · 14 of 23 positions shown · non-contrast
Comparison: none

OBSTETRICAL ULTRASOUND:
 This ultrasound was performed in The [HOSPITAL], and the AS OB/GYN report will be stored to [REDACTED] PACS.

[Series 1: us fetal bpp w/o nonstress · 14 of 23 slices shown]
[im 1/23]
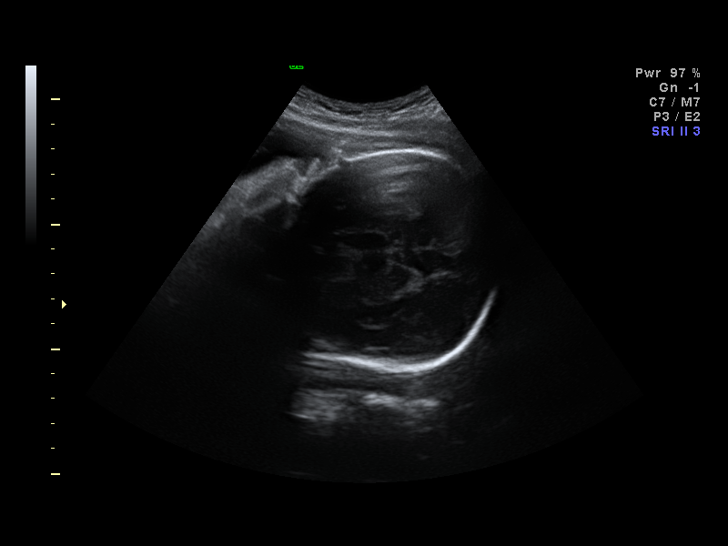
[im 3/23]
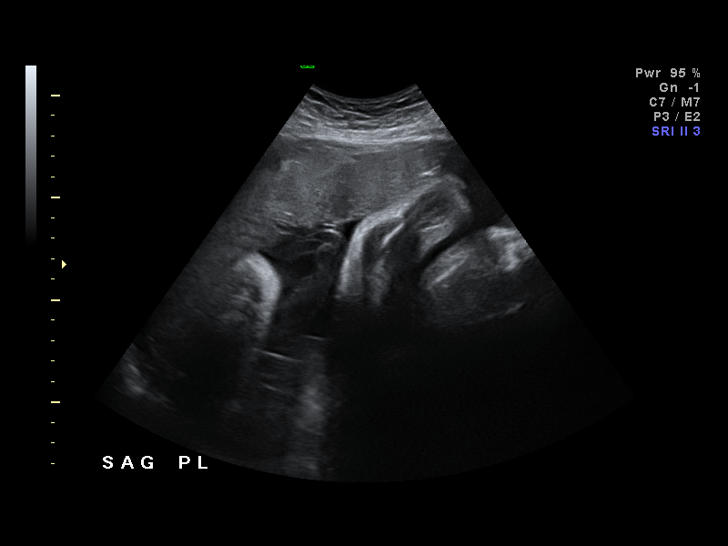
[im 5/23]
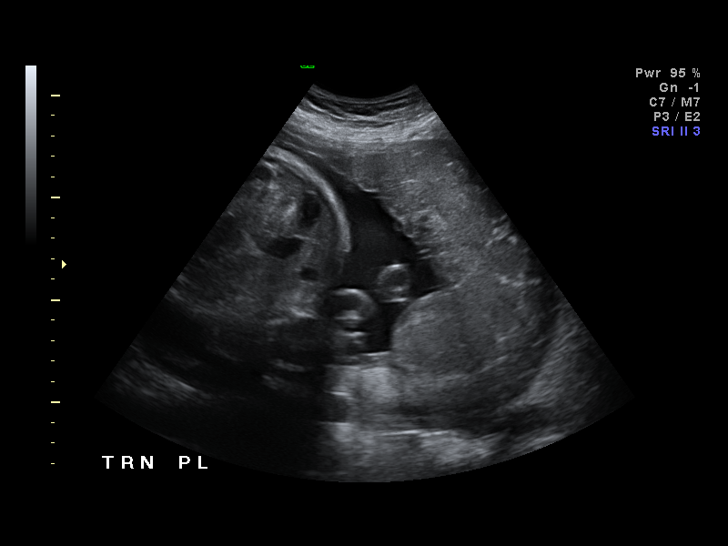
[im 6/23]
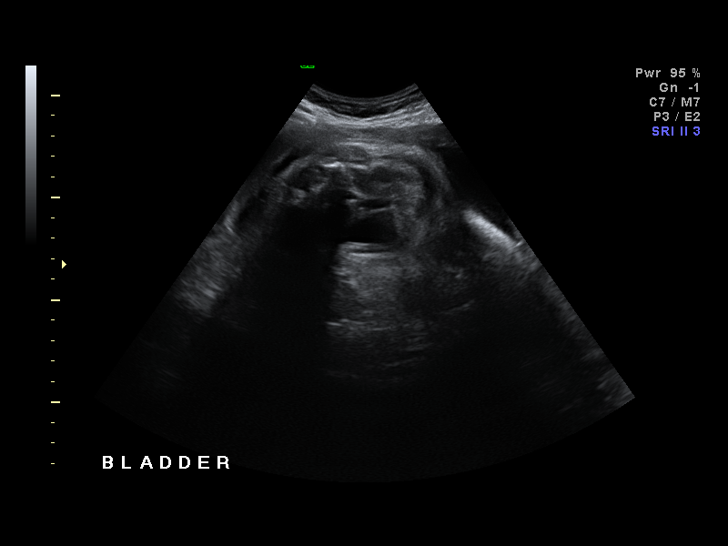
[im 8/23]
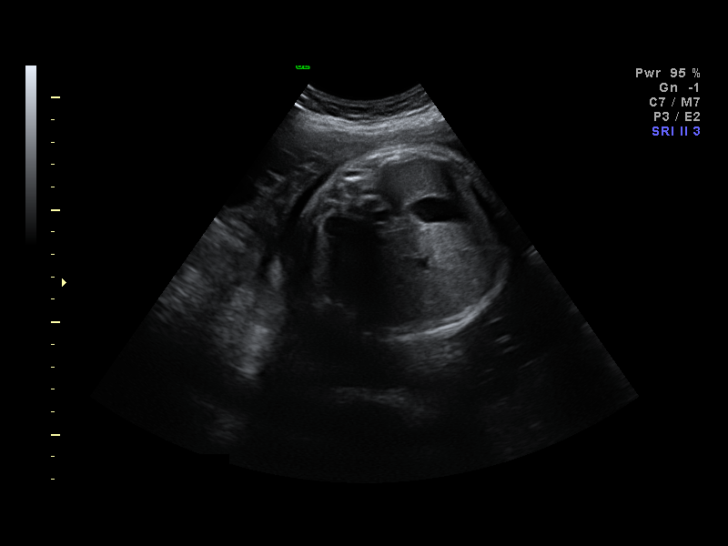
[im 10/23]
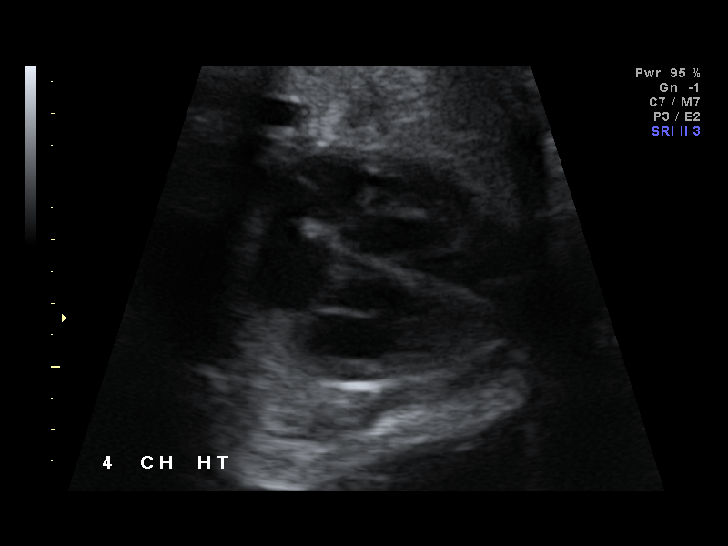
[im 11/23]
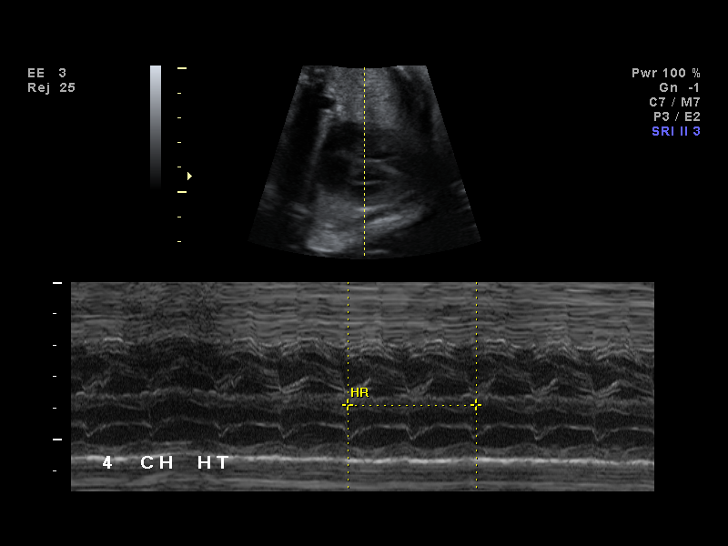
[im 13/23]
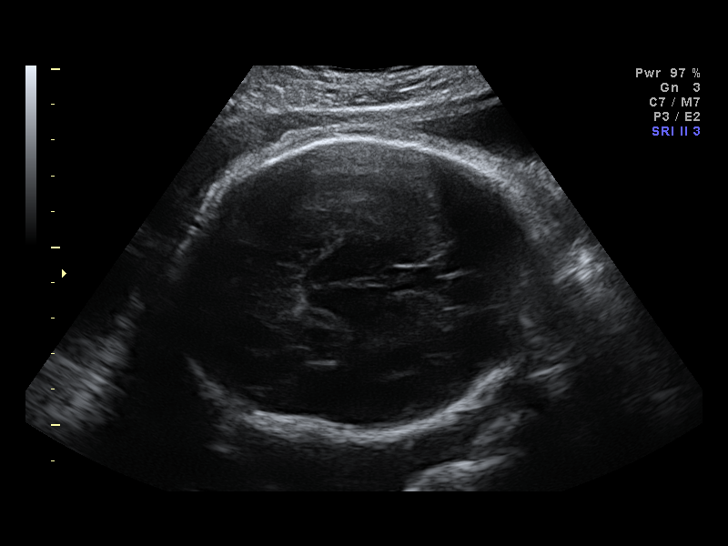
[im 14/23]
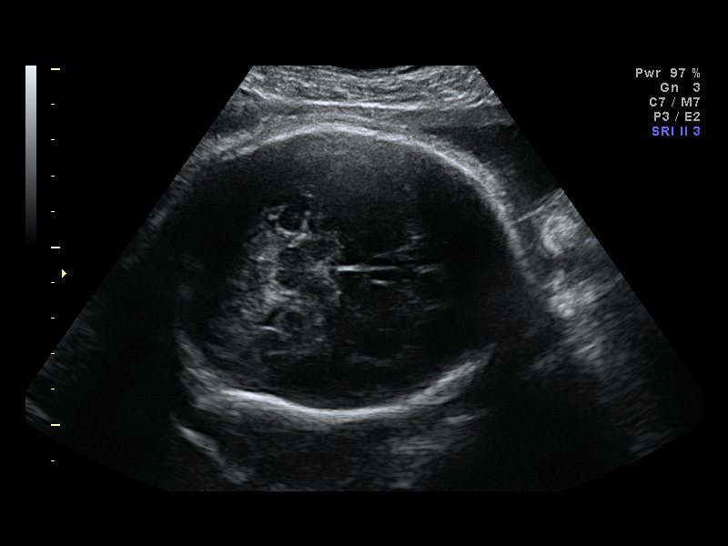
[im 16/23]
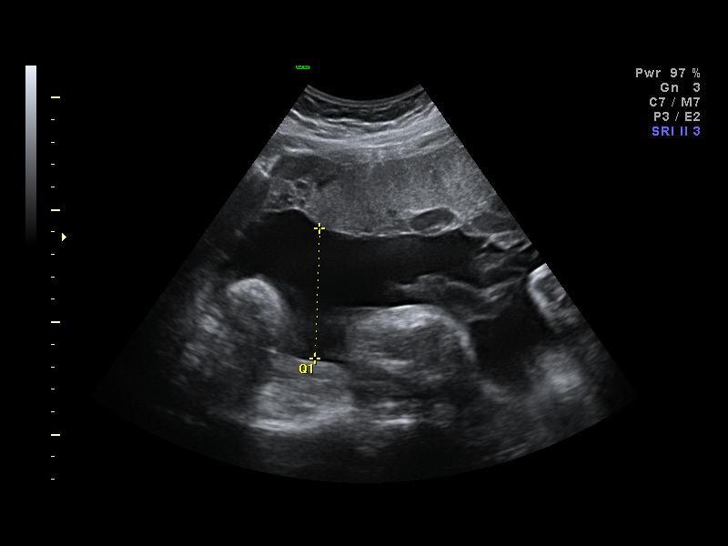
[im 18/23]
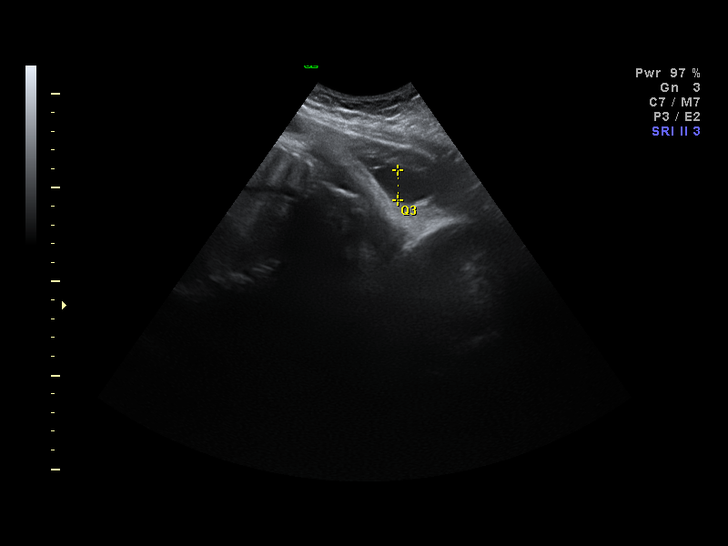
[im 19/23]
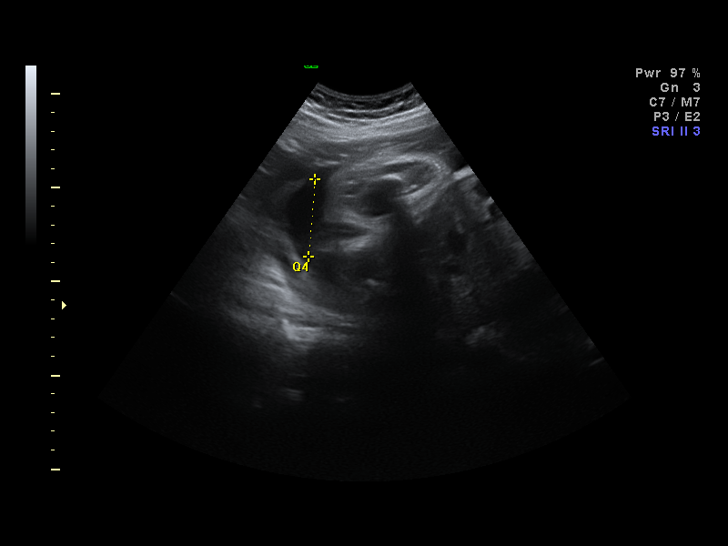
[im 21/23]
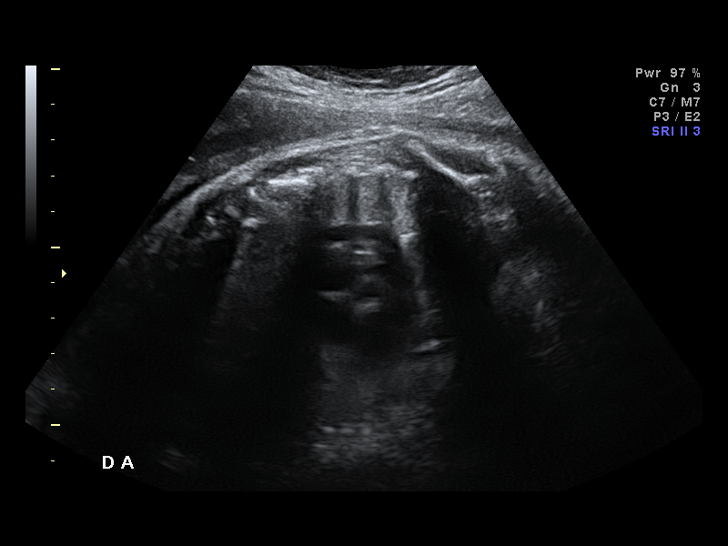
[im 23/23]
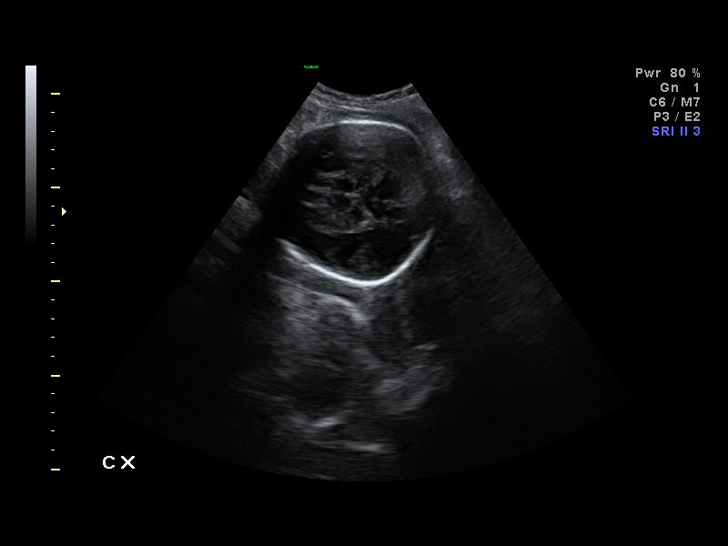

[14 of 23 positions shown; findings below may reference images not displayed]

IMPRESSION: The AS OB/GYN report has also been faxed to the ordering physician.

## 2008-01-30 ENCOUNTER — Ambulatory Visit: Payer: Self-pay | Admitting: Obstetrics & Gynecology

## 2008-02-05 ENCOUNTER — Inpatient Hospital Stay (HOSPITAL_COMMUNITY): Admission: AD | Admit: 2008-02-05 | Discharge: 2008-02-05 | Payer: Self-pay | Admitting: Obstetrics & Gynecology

## 2008-02-13 ENCOUNTER — Ambulatory Visit: Payer: Self-pay | Admitting: Obstetrics & Gynecology

## 2008-02-15 LAB — CBC WITH DIFFERENTIAL/PLATELET
Basophils Absolute: 0 10*3/uL (ref 0.0–0.1)
Eosinophils Absolute: 0.1 10*3/uL (ref 0.0–0.5)
HGB: 10.8 g/dL — ABNORMAL LOW (ref 11.6–15.9)
LYMPH%: 21.1 % (ref 14.0–48.0)
MCV: 83.3 fL (ref 81.0–101.0)
MONO%: 7.2 % (ref 0.0–13.0)
NEUT#: 5.1 10*3/uL (ref 1.5–6.5)
NEUT%: 70.7 % (ref 39.6–76.8)
Platelets: 88 10*3/uL — ABNORMAL LOW (ref 145–400)

## 2008-02-20 ENCOUNTER — Ambulatory Visit: Payer: Self-pay | Admitting: Obstetrics & Gynecology

## 2008-02-29 ENCOUNTER — Inpatient Hospital Stay (HOSPITAL_COMMUNITY): Admission: AD | Admit: 2008-02-29 | Discharge: 2008-02-29 | Payer: Self-pay | Admitting: Obstetrics & Gynecology

## 2008-02-29 ENCOUNTER — Ambulatory Visit: Payer: Self-pay | Admitting: Obstetrics and Gynecology

## 2008-03-01 ENCOUNTER — Ambulatory Visit: Payer: Self-pay | Admitting: Obstetrics & Gynecology

## 2008-03-04 ENCOUNTER — Ambulatory Visit: Payer: Self-pay | Admitting: Obstetrics and Gynecology

## 2008-03-04 ENCOUNTER — Inpatient Hospital Stay (HOSPITAL_COMMUNITY): Admission: AD | Admit: 2008-03-04 | Discharge: 2008-03-07 | Payer: Self-pay | Admitting: Family Medicine

## 2008-03-20 ENCOUNTER — Ambulatory Visit: Payer: Self-pay | Admitting: Hematology and Oncology

## 2008-03-28 LAB — CBC WITH DIFFERENTIAL/PLATELET
Basophils Absolute: 0 10*3/uL (ref 0.0–0.1)
EOS%: 1.8 % (ref 0.0–7.0)
Eosinophils Absolute: 0.1 10*3/uL (ref 0.0–0.5)
HCT: 35.3 % (ref 34.8–46.6)
HGB: 12 g/dL (ref 11.6–15.9)
MCH: 27.8 pg (ref 26.0–34.0)
MCV: 82.1 fL (ref 81.0–101.0)
MONO%: 7.1 % (ref 0.0–13.0)
NEUT#: 4.2 10*3/uL (ref 1.5–6.5)
NEUT%: 62.8 % (ref 39.6–76.8)
Platelets: 112 10*3/uL — ABNORMAL LOW (ref 145–400)
RDW: 14.2 % (ref 11.3–14.5)

## 2008-03-28 LAB — COMPREHENSIVE METABOLIC PANEL
Albumin: 3.8 g/dL (ref 3.5–5.2)
Alkaline Phosphatase: 54 U/L (ref 39–117)
BUN: 15 mg/dL (ref 6–23)
Calcium: 8.8 mg/dL (ref 8.4–10.5)
Creatinine, Ser: 0.83 mg/dL (ref 0.40–1.20)
Glucose, Bld: 88 mg/dL (ref 70–99)
Potassium: 3.7 mEq/L (ref 3.5–5.3)

## 2008-05-16 ENCOUNTER — Emergency Department (HOSPITAL_COMMUNITY): Admission: EM | Admit: 2008-05-16 | Discharge: 2008-05-16 | Payer: Self-pay | Admitting: Emergency Medicine

## 2008-05-16 IMAGING — CR DG CHEST 2V
2 series · 2 of 2 positions shown · non-contrast
Comparison: None available

CLINICAL DATA: Fever, nausea, muscle aches

CHEST - 2 VIEW

[w chest pa]
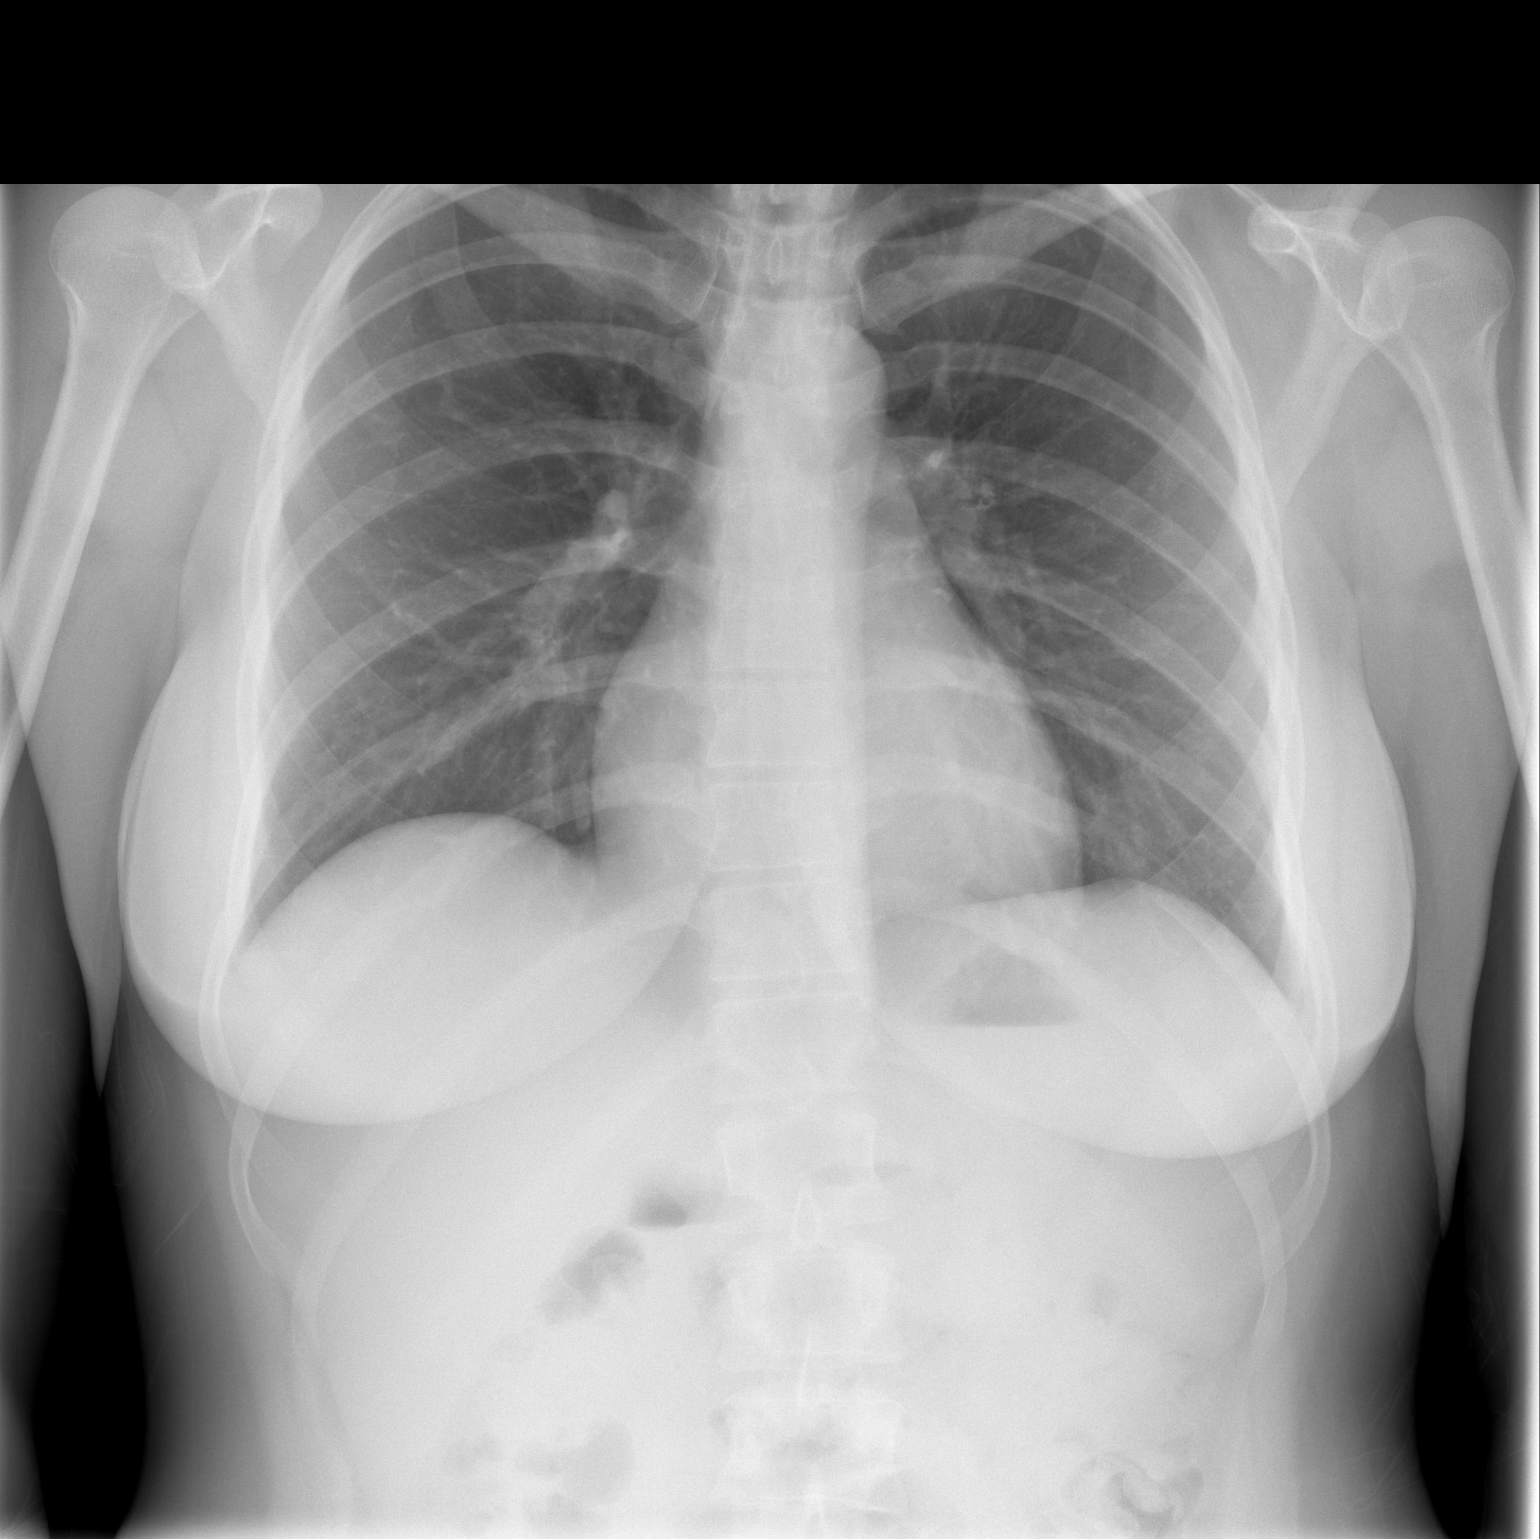

[w chest lat]
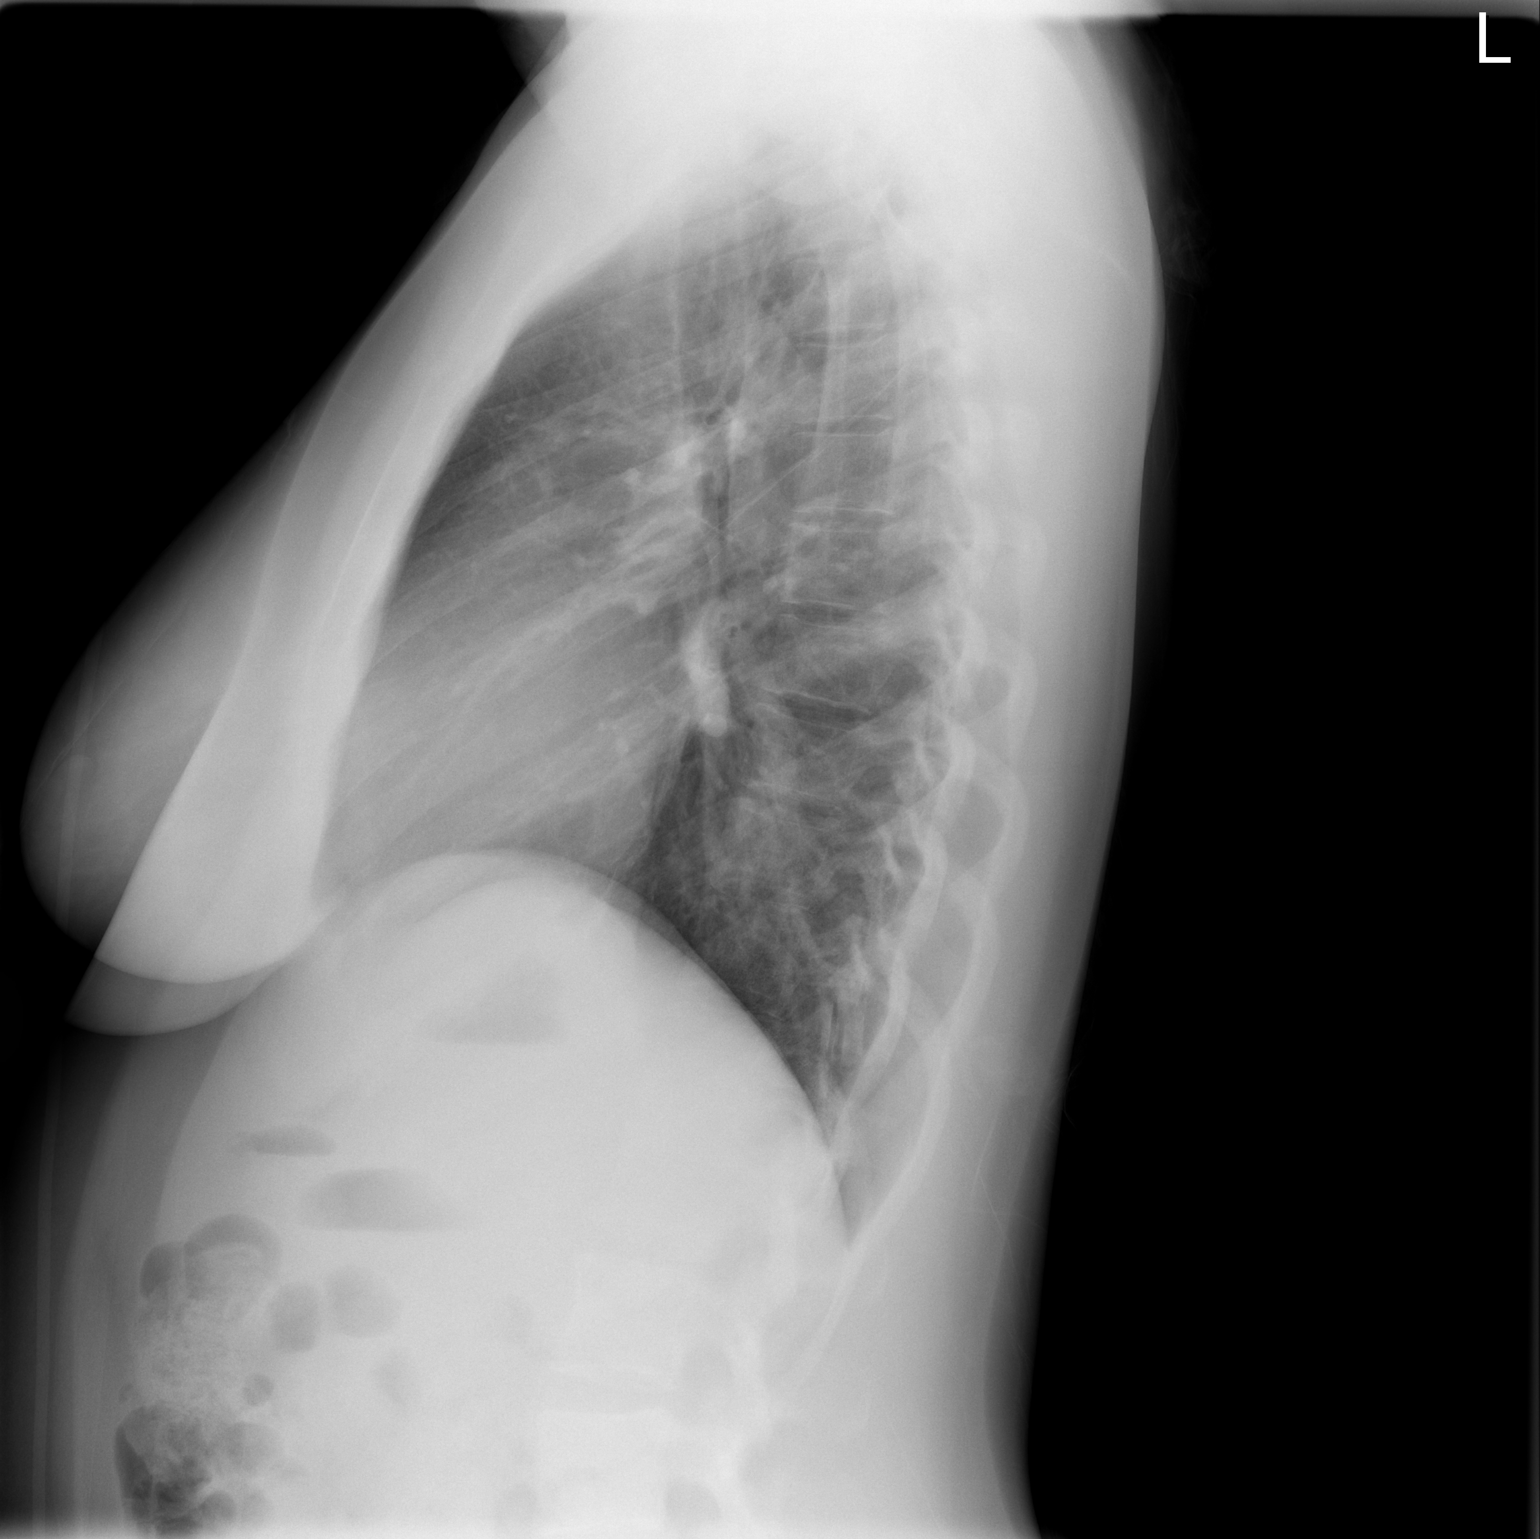

[2 of 2 positions shown; findings below may reference images not displayed]

FINDINGS: Lungs clear.  Cardiopericardial silhouette within normal
limits.  Trachea midline.
IMPRESSION: No active cardiopulmonary disease.

## 2008-06-11 ENCOUNTER — Ambulatory Visit: Payer: Self-pay | Admitting: Hematology and Oncology

## 2008-06-13 LAB — CBC WITH DIFFERENTIAL/PLATELET
Basophils Absolute: 0 10*3/uL (ref 0.0–0.1)
Eosinophils Absolute: 0.1 10*3/uL (ref 0.0–0.5)
HCT: 33.1 % — ABNORMAL LOW (ref 34.8–46.6)
HGB: 11.4 g/dL — ABNORMAL LOW (ref 11.6–15.9)
MCH: 28.3 pg (ref 26.0–34.0)
MONO#: 0.4 10*3/uL (ref 0.1–0.9)
NEUT#: 3.3 10*3/uL (ref 1.5–6.5)
NEUT%: 54.8 % (ref 39.6–76.8)
RDW: 14.8 % — ABNORMAL HIGH (ref 11.3–14.5)
lymph#: 2.2 10*3/uL (ref 0.9–3.3)

## 2008-06-18 ENCOUNTER — Emergency Department (HOSPITAL_COMMUNITY): Admission: EM | Admit: 2008-06-18 | Discharge: 2008-06-19 | Payer: Self-pay | Admitting: Emergency Medicine

## 2008-12-11 ENCOUNTER — Ambulatory Visit: Payer: Self-pay | Admitting: Hematology and Oncology

## 2008-12-13 LAB — CBC WITH DIFFERENTIAL/PLATELET
BASO%: 0.3 % (ref 0.0–2.0)
Basophils Absolute: 0 10*3/uL (ref 0.0–0.1)
EOS%: 0.8 % (ref 0.0–7.0)
Eosinophils Absolute: 0.1 10*3/uL (ref 0.0–0.5)
HCT: 36.8 % (ref 34.8–46.6)
HGB: 12.6 g/dL (ref 11.6–15.9)
LYMPH%: 24.8 % (ref 14.0–48.0)
MCH: 28.8 pg (ref 26.0–34.0)
MCHC: 34.3 g/dL (ref 32.0–36.0)
MCV: 83.9 fL (ref 81.0–101.0)
MONO#: 0.3 10*3/uL (ref 0.1–0.9)
MONO%: 4.6 % (ref 0.0–13.0)
NEUT#: 4.7 10*3/uL (ref 1.5–6.5)
NEUT%: 69.5 % (ref 39.6–76.8)
Platelets: 106 10*3/uL — ABNORMAL LOW (ref 145–400)
RBC: 4.39 10*6/uL (ref 3.70–5.32)
RDW: 13.9 % (ref 11.3–14.5)
WBC: 6.8 10*3/uL (ref 3.9–10.0)
lymph#: 1.7 10*3/uL (ref 0.9–3.3)

## 2008-12-14 LAB — BASIC METABOLIC PANEL
CO2: 24 mEq/L (ref 19–32)
Chloride: 105 mEq/L (ref 96–112)
Glucose, Bld: 76 mg/dL (ref 70–99)
Potassium: 3.9 mEq/L (ref 3.5–5.3)
Sodium: 139 mEq/L (ref 135–145)

## 2008-12-14 LAB — RHEUMATOID FACTOR: Rhuematoid fact SerPl-aCnc: <20 IU/mL (ref 0–20)

## 2008-12-14 LAB — ANA: Anti Nuclear Antibody(ANA): NEGATIVE

## 2008-12-19 ENCOUNTER — Emergency Department (HOSPITAL_BASED_OUTPATIENT_CLINIC_OR_DEPARTMENT_OTHER): Admission: EM | Admit: 2008-12-19 | Discharge: 2008-12-19 | Payer: Self-pay | Admitting: Emergency Medicine

## 2009-02-07 ENCOUNTER — Ambulatory Visit: Payer: Self-pay | Admitting: Family Medicine

## 2009-07-05 ENCOUNTER — Emergency Department (HOSPITAL_COMMUNITY): Admission: EM | Admit: 2009-07-05 | Discharge: 2009-07-05 | Payer: Self-pay | Admitting: Emergency Medicine

## 2009-09-10 ENCOUNTER — Encounter (INDEPENDENT_AMBULATORY_CARE_PROVIDER_SITE_OTHER): Payer: Self-pay | Admitting: *Deleted

## 2009-09-10 DIAGNOSIS — F17200 Nicotine dependence, unspecified, uncomplicated: Secondary | ICD-10-CM

## 2009-09-10 HISTORY — DX: Nicotine dependence, unspecified, uncomplicated: F17.200

## 2009-12-10 ENCOUNTER — Ambulatory Visit: Payer: Self-pay | Admitting: Hematology and Oncology

## 2010-01-07 ENCOUNTER — Ambulatory Visit: Payer: Self-pay | Admitting: Hematology and Oncology

## 2010-01-10 ENCOUNTER — Encounter: Payer: Self-pay | Admitting: Family Medicine

## 2010-01-10 LAB — CBC WITH DIFFERENTIAL/PLATELET
BASO%: 0.7 % (ref 0.0–2.0)
Basophils Absolute: 0 10*3/uL (ref 0.0–0.1)
EOS%: 1.7 % (ref 0.0–7.0)
Eosinophils Absolute: 0.1 10*3/uL (ref 0.0–0.5)
HCT: 30 % — ABNORMAL LOW (ref 34.8–46.6)
HGB: 9.9 g/dL — ABNORMAL LOW (ref 11.6–15.9)
LYMPH%: 32.5 % (ref 14.0–49.7)
MCH: 26.6 pg (ref 25.1–34.0)
MCHC: 33 g/dL (ref 31.5–36.0)
MCV: 80.6 fL (ref 79.5–101.0)
MONO#: 0.4 10*3/uL (ref 0.1–0.9)
MONO%: 6.4 % (ref 0.0–14.0)
NEUT#: 3.4 10*3/uL (ref 1.5–6.5)
NEUT%: 58.7 % (ref 38.4–76.8)
Platelets: 134 10*3/uL — ABNORMAL LOW (ref 145–400)
RBC: 3.72 10*6/uL (ref 3.70–5.45)
RDW: 13.9 % (ref 11.2–14.5)
WBC: 5.8 10*3/uL (ref 3.9–10.3)
lymph#: 1.9 10*3/uL (ref 0.9–3.3)
nRBC: 0 % (ref 0–0)

## 2010-01-10 LAB — COMPREHENSIVE METABOLIC PANEL
ALT: 11 U/L (ref 0–35)
AST: 15 U/L (ref 0–37)
Albumin: 4.1 g/dL (ref 3.5–5.2)
Alkaline Phosphatase: 60 U/L (ref 39–117)
BUN: 19 mg/dL (ref 6–23)
CO2: 20 mEq/L (ref 19–32)
Calcium: 8.8 mg/dL (ref 8.4–10.5)
Chloride: 108 mEq/L (ref 96–112)
Creatinine, Ser: 0.79 mg/dL (ref 0.40–1.20)
Glucose, Bld: 98 mg/dL (ref 70–99)
Potassium: 3.8 mEq/L (ref 3.5–5.3)
Sodium: 140 mEq/L (ref 135–145)
Total Bilirubin: 0.2 mg/dL — ABNORMAL LOW (ref 0.3–1.2)
Total Protein: 6.9 g/dL (ref 6.0–8.3)

## 2010-01-14 LAB — FERRITIN: Ferritin: 2 ng/mL — ABNORMAL LOW (ref 10–291)

## 2010-01-14 LAB — IRON AND TIBC
%SAT: 6 % — ABNORMAL LOW (ref 20–55)
Iron: 21 ug/dL — ABNORMAL LOW (ref 42–145)
TIBC: 360 ug/dL (ref 250–470)
UIBC: 339 ug/dL

## 2010-03-31 ENCOUNTER — Ambulatory Visit: Payer: Self-pay | Admitting: Hematology and Oncology

## 2010-04-02 LAB — BASIC METABOLIC PANEL
Calcium: 8.6 mg/dL (ref 8.4–10.5)
Creatinine, Ser: 0.72 mg/dL (ref 0.40–1.20)

## 2010-04-02 LAB — CBC WITH DIFFERENTIAL/PLATELET
BASO%: 0.4 % (ref 0.0–2.0)
EOS%: 1.9 % (ref 0.0–7.0)
Eosinophils Absolute: 0.1 10*3/uL (ref 0.0–0.5)
MCH: 26.6 pg (ref 25.1–34.0)
MCHC: 33.8 g/dL (ref 31.5–36.0)
MCV: 78.6 fL — ABNORMAL LOW (ref 79.5–101.0)
MONO%: 6.9 % (ref 0.0–14.0)
NEUT#: 4.2 10*3/uL (ref 1.5–6.5)
RBC: 4.59 10*6/uL (ref 3.70–5.45)
RDW: 14.5 % (ref 11.2–14.5)
nRBC: 0 % (ref 0–0)

## 2010-04-02 LAB — TECHNOLOGIST REVIEW

## 2010-04-09 LAB — IRON AND TIBC
Iron: 38 ug/dL — ABNORMAL LOW (ref 42–145)
TIBC: 336 ug/dL (ref 250–470)
UIBC: 298 ug/dL

## 2010-06-25 ENCOUNTER — Ambulatory Visit: Payer: Self-pay | Admitting: Nurse Practitioner

## 2010-06-25 ENCOUNTER — Inpatient Hospital Stay (HOSPITAL_COMMUNITY): Admission: AD | Admit: 2010-06-25 | Discharge: 2010-06-25 | Payer: Self-pay | Admitting: Obstetrics & Gynecology

## 2010-06-25 IMAGING — US US OB COMP +14 WK
1 series · 14 of 28 positions shown · non-contrast
Comparison: none

OBSTETRICAL ULTRASOUND:
 This ultrasound exam was performed in the [HOSPITAL] Ultrasound Department.  The OB US report was generated in the AS system, and faxed to the ordering physician.  This report is also available in [HOSPITAL]?s [employer] and in [REDACTED] PACS.

[Series 1: us ob comp +14 wk · 0.21mm/px · 14 of 43 slices shown]
[im 2/43]
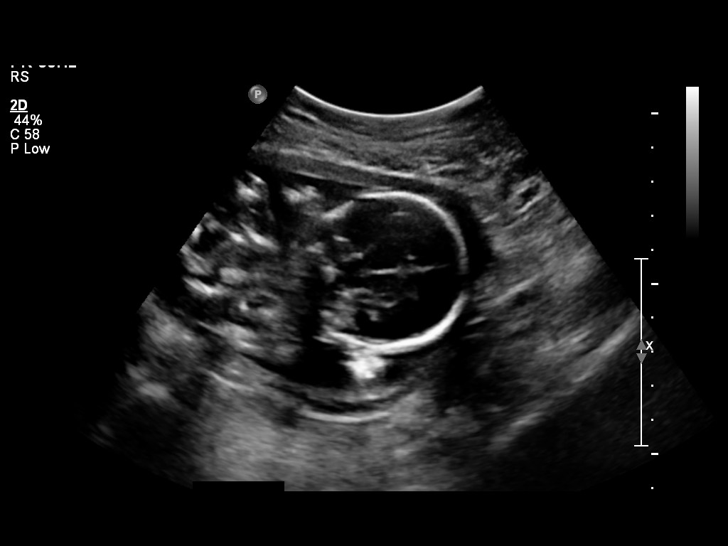
[im 5/43]
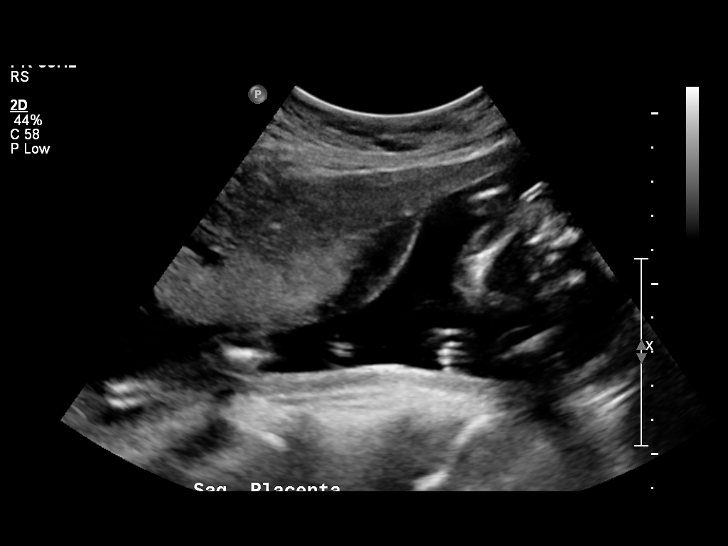
[im 8/43]
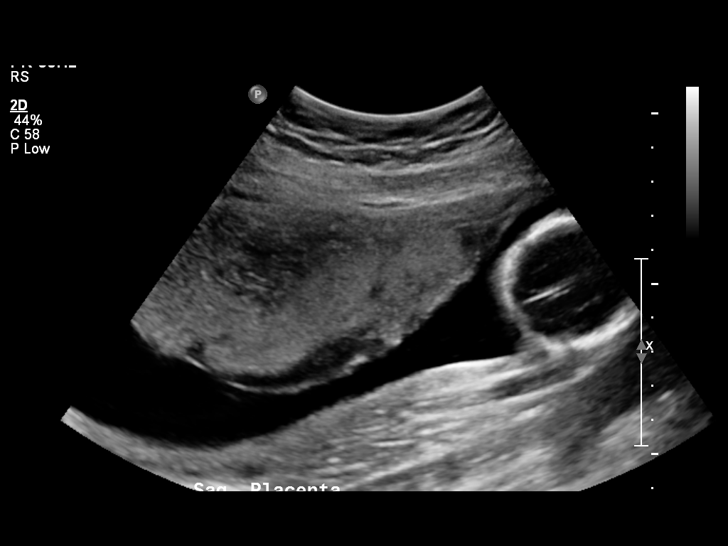
[im 11/43]
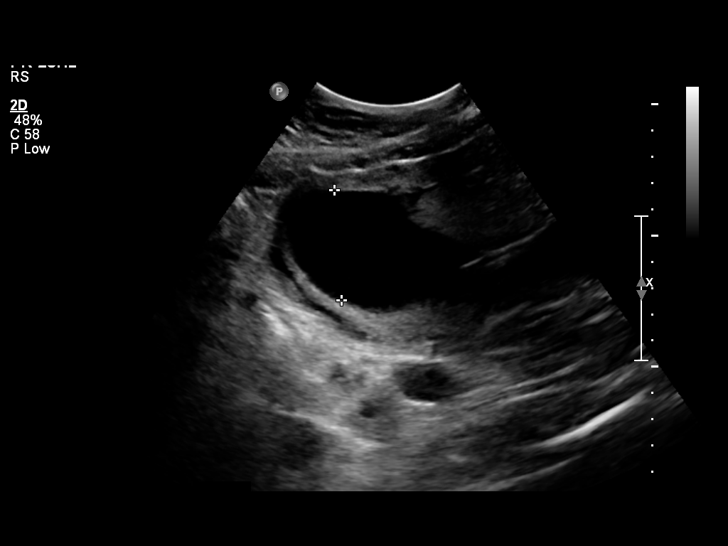
[im 15/43]
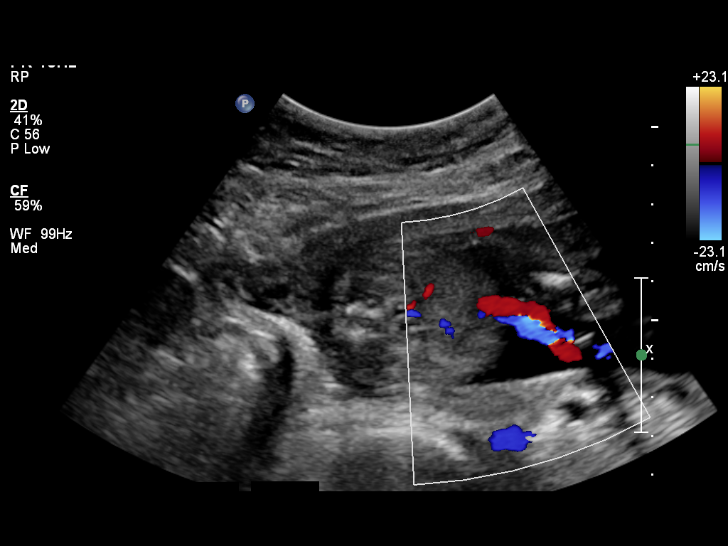
[im 18/43]
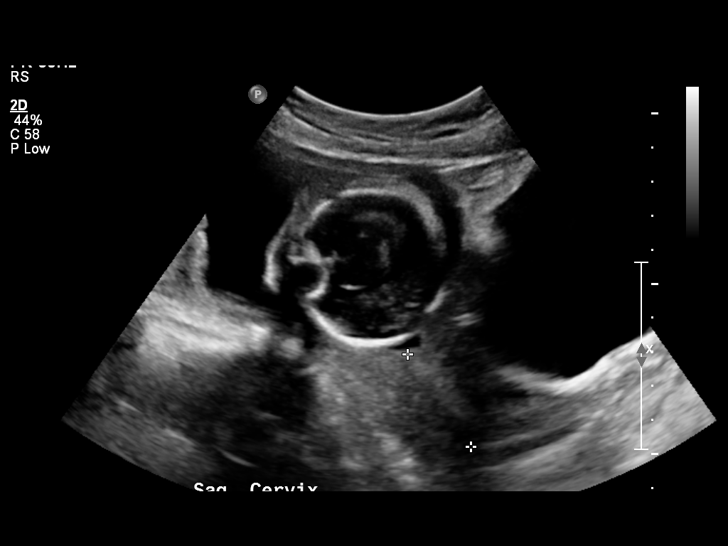
[im 21/43]
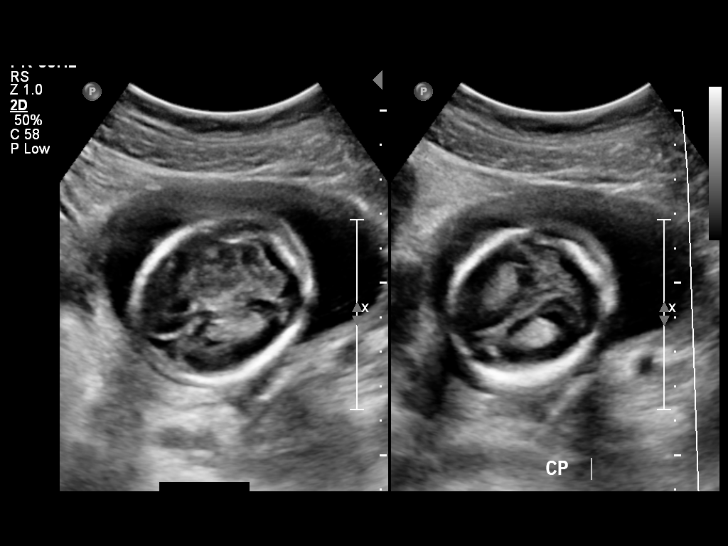
[im 24/43]
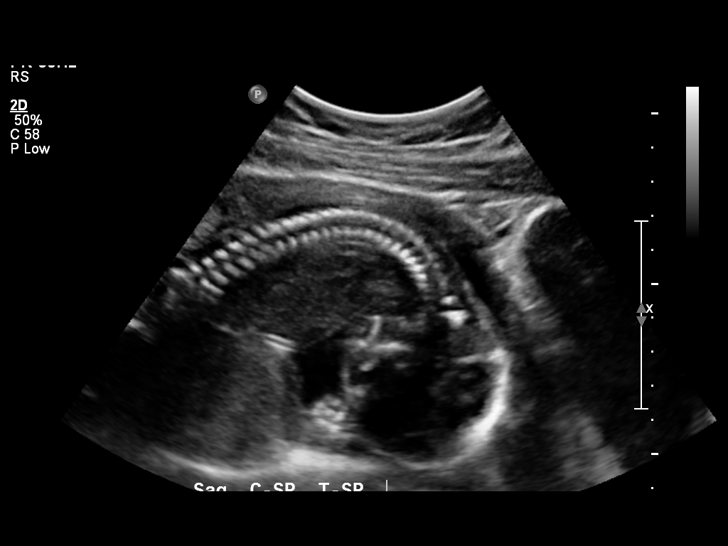
[im 27/43]
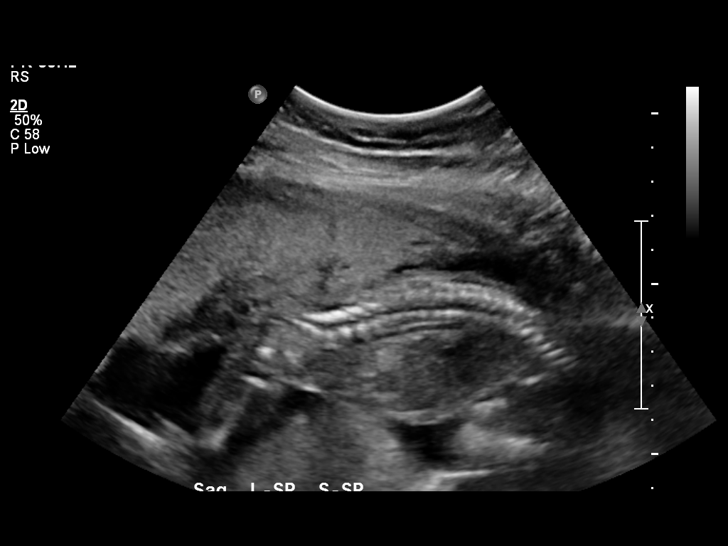
[im 30/43]
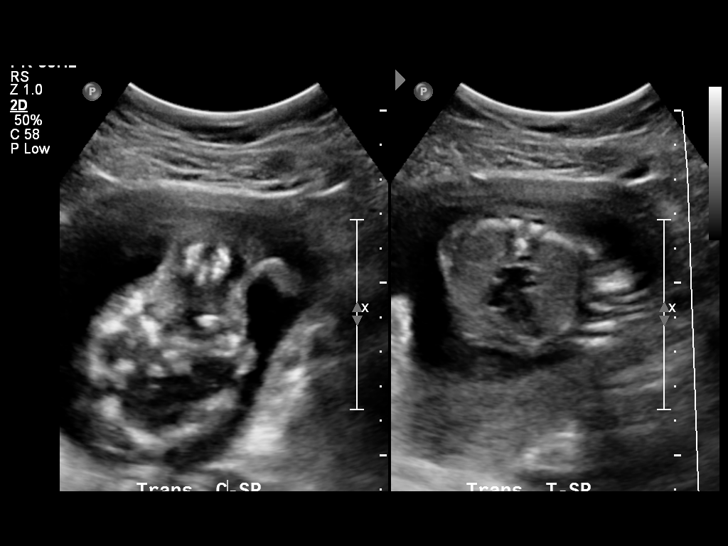
[im 33/43]
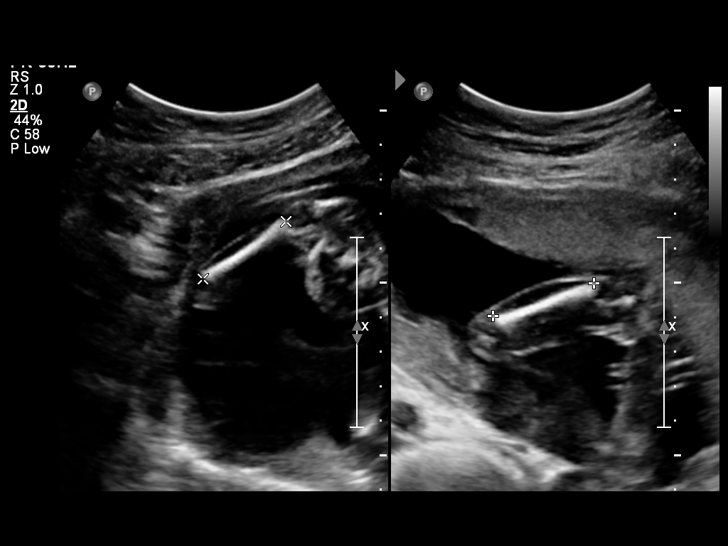
[im 36/43]
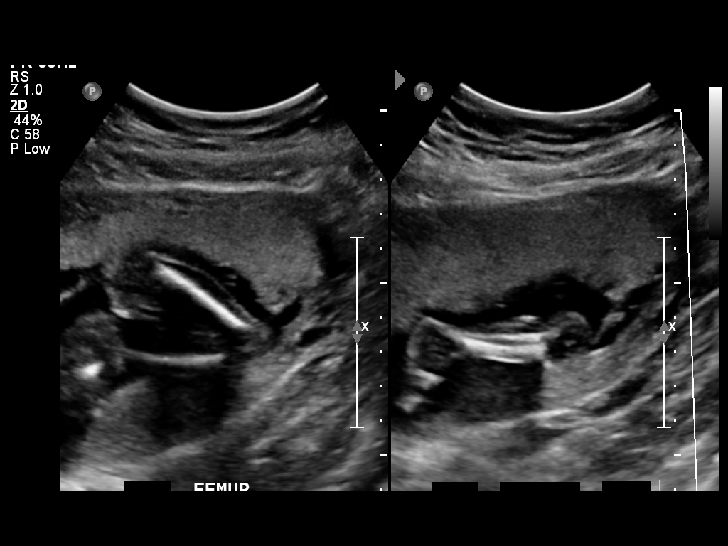
[im 39/43]
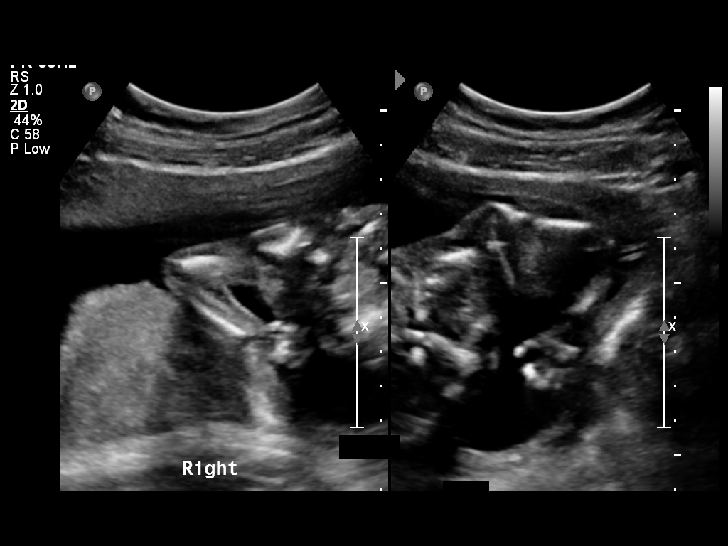
[im 43/43]
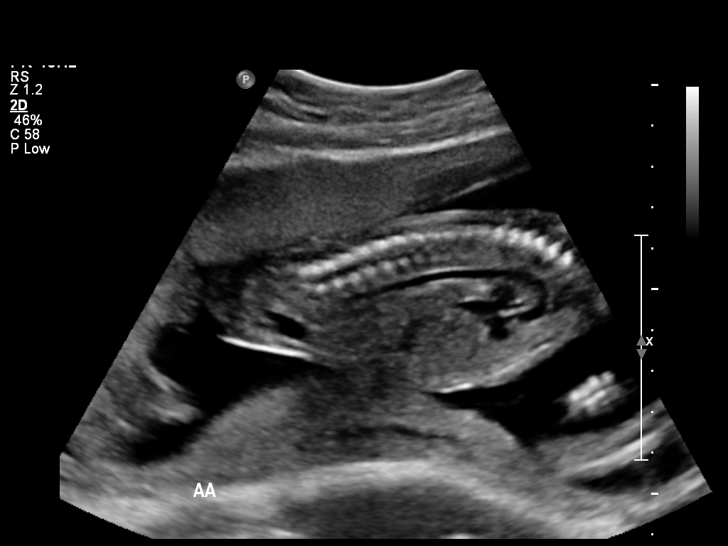

[14 of 28 positions shown; findings below may reference images not displayed]

IMPRESSION: See AS Obstetric US report.

## 2010-07-24 ENCOUNTER — Ambulatory Visit: Payer: Self-pay | Admitting: Hematology and Oncology

## 2010-07-24 LAB — CBC WITH DIFFERENTIAL/PLATELET
BASO%: 0.2 % (ref 0.0–2.0)
HCT: 33.9 % — ABNORMAL LOW (ref 34.8–46.6)
LYMPH%: 21.5 % (ref 14.0–49.7)
MCH: 28.8 pg (ref 25.1–34.0)
MCHC: 33.9 g/dL (ref 31.5–36.0)
MONO#: 0.4 10*3/uL (ref 0.1–0.9)
NEUT%: 72.3 % (ref 38.4–76.8)
Platelets: 90 10*3/uL — ABNORMAL LOW (ref 145–400)

## 2010-07-24 LAB — BASIC METABOLIC PANEL
BUN: 8 mg/dL (ref 6–23)
Chloride: 106 mEq/L (ref 96–112)
Creatinine, Ser: 0.57 mg/dL (ref 0.40–1.20)

## 2010-07-31 ENCOUNTER — Ambulatory Visit: Payer: Self-pay | Admitting: Family Medicine

## 2010-08-20 LAB — CBC WITH DIFFERENTIAL/PLATELET
Basophils Absolute: 0 10*3/uL (ref 0.0–0.1)
Eosinophils Absolute: 0 10*3/uL (ref 0.0–0.5)
HGB: 11.5 g/dL — ABNORMAL LOW (ref 11.6–15.9)
NEUT#: 5.6 10*3/uL (ref 1.5–6.5)
RDW: 14.2 % (ref 11.2–14.5)
lymph#: 1.6 10*3/uL (ref 0.9–3.3)

## 2010-08-20 LAB — BASIC METABOLIC PANEL
CO2: 21 mEq/L (ref 19–32)
Glucose, Bld: 83 mg/dL (ref 70–99)
Potassium: 3.5 mEq/L (ref 3.5–5.3)
Sodium: 138 mEq/L (ref 135–145)

## 2010-08-21 ENCOUNTER — Encounter: Payer: Self-pay | Admitting: Obstetrics and Gynecology

## 2010-08-21 ENCOUNTER — Ambulatory Visit: Payer: Self-pay | Admitting: Obstetrics and Gynecology

## 2010-08-21 ENCOUNTER — Ambulatory Visit (HOSPITAL_COMMUNITY): Admission: RE | Admit: 2010-08-21 | Discharge: 2010-08-21 | Payer: Self-pay | Admitting: Family Medicine

## 2010-08-21 LAB — CONVERTED CEMR LAB
HCT: 35 % — ABNORMAL LOW (ref 36.0–46.0)
Hemoglobin: 11.6 g/dL — ABNORMAL LOW (ref 12.0–15.0)
MCHC: 33.1 g/dL (ref 30.0–36.0)
MCV: 85.4 fL (ref 78.0–100.0)
Platelets: 73 10*3/uL — ABNORMAL LOW (ref 150–400)
RBC: 4.1 M/uL (ref 3.87–5.11)
RDW: 14.7 % (ref 11.5–15.5)
WBC: 7.9 10*3/uL (ref 4.0–10.5)

## 2010-08-21 IMAGING — US US OB FOLLOW-UP
1 series · 14 of 28 positions shown · non-contrast
Comparison: none

OBSTETRICAL ULTRASOUND:
 This ultrasound exam was performed in the [HOSPITAL] Ultrasound Department.  The OB US report was generated in the AS system, and faxed to the ordering physician.  This report is also available in [HOSPITAL]?s [employer] and in [REDACTED] PACS.

[Series 1: us ob follow up · 0.30mm/px · 14 of 29 slices shown]
[im 2/29]
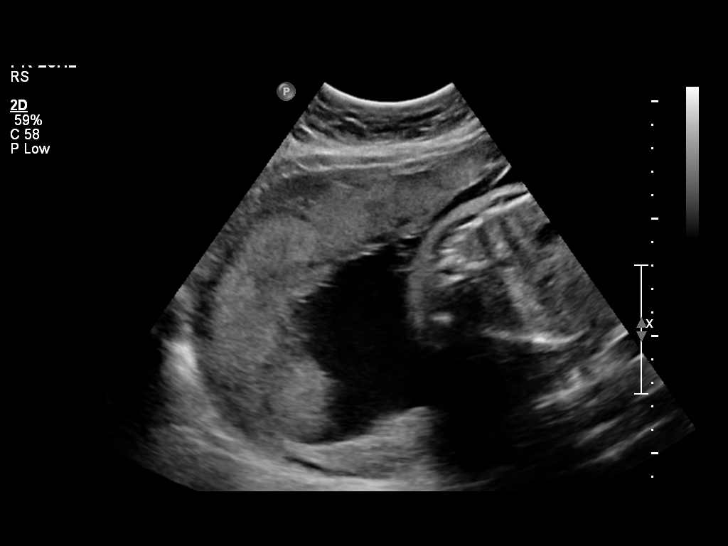
[im 4/29]
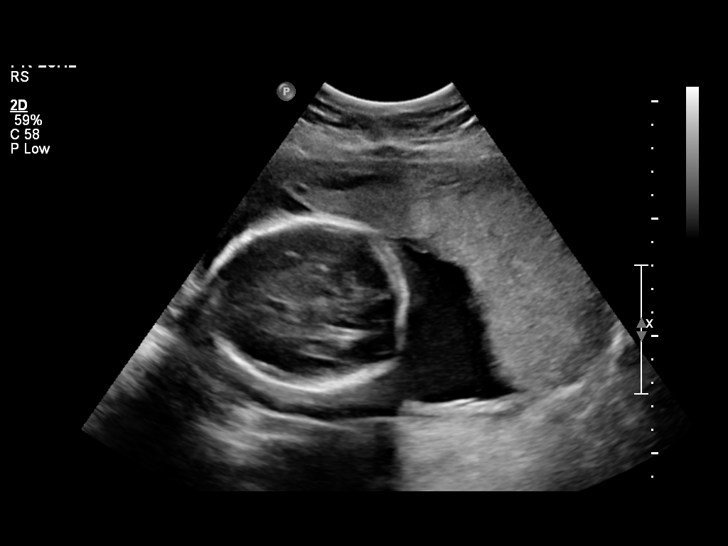
[im 6/29]
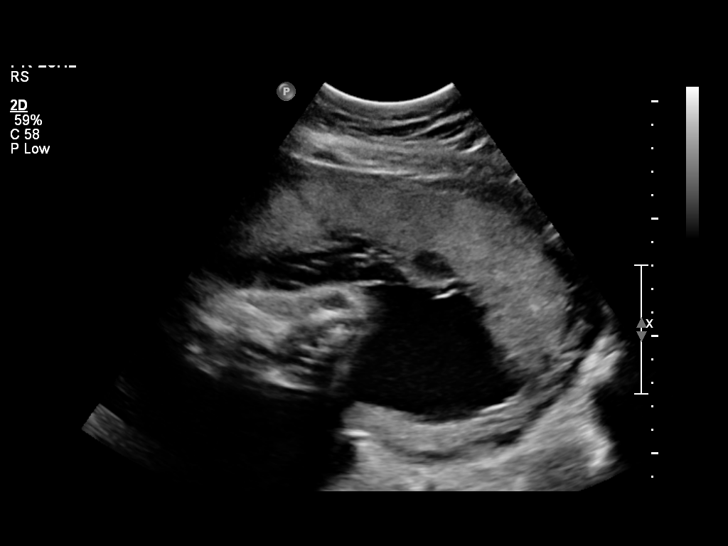
[im 8/29]
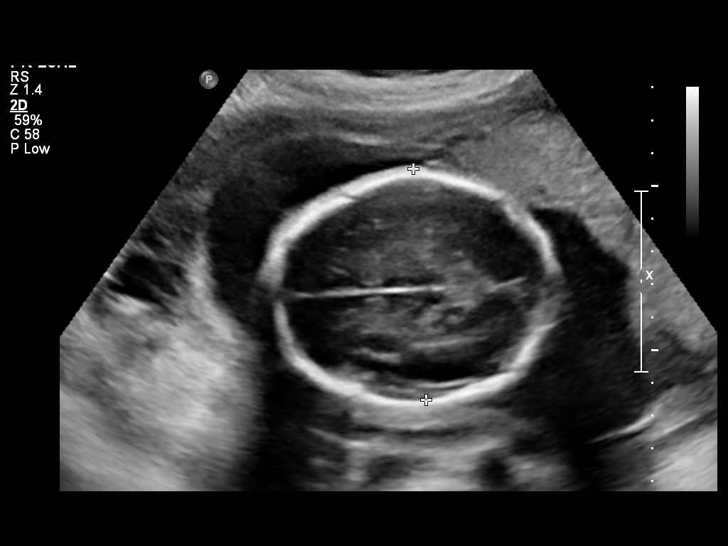
[im 10/29]
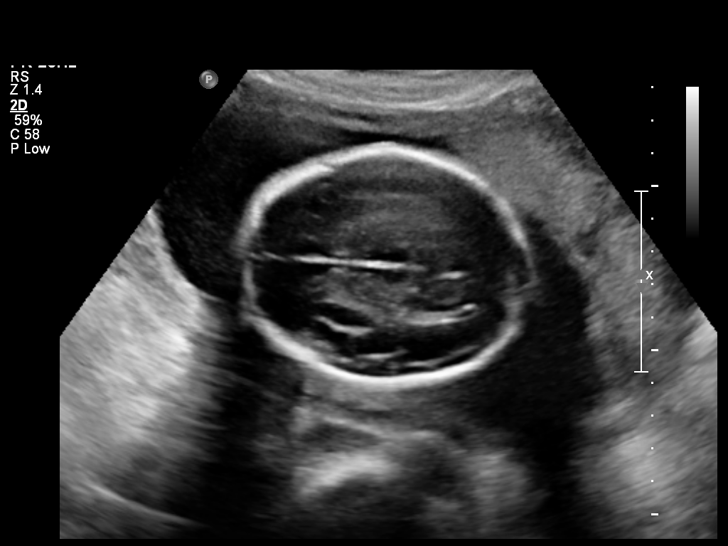
[im 12/29]
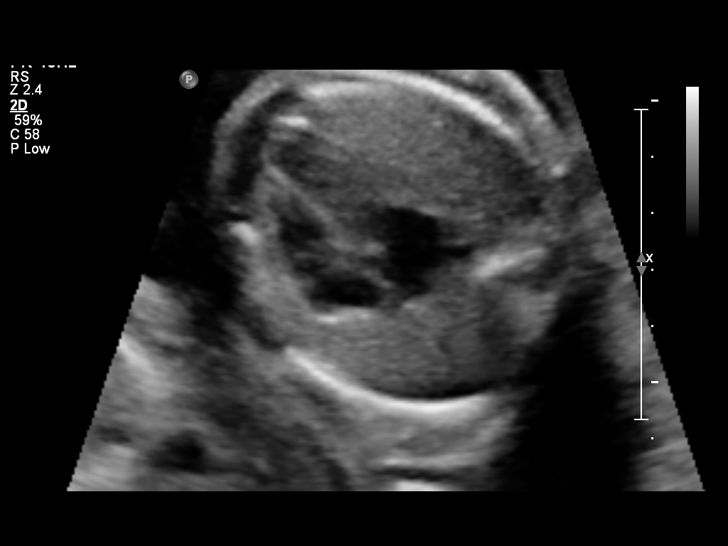
[im 14/29]
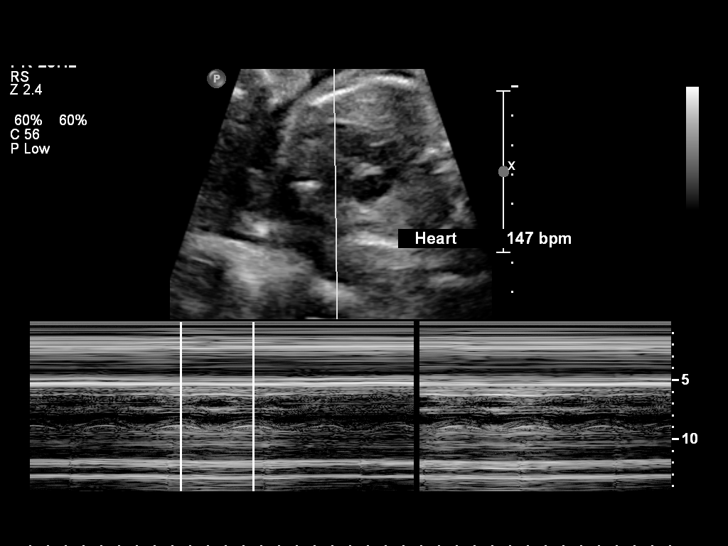
[im 16/29]
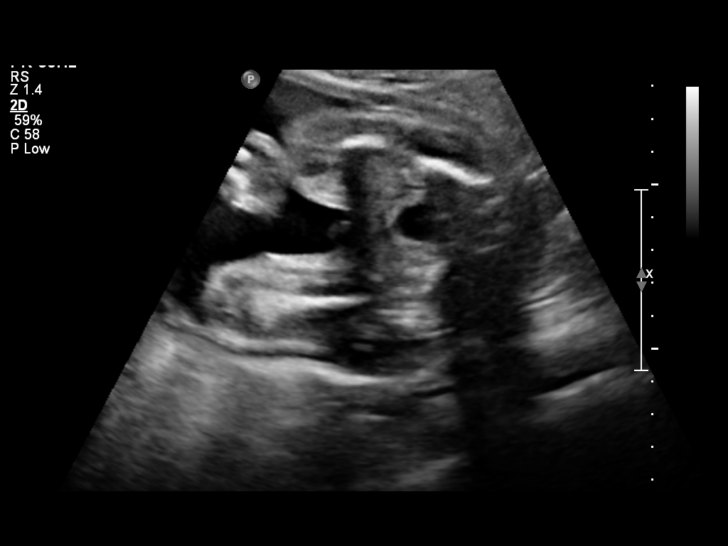
[im 18/29]
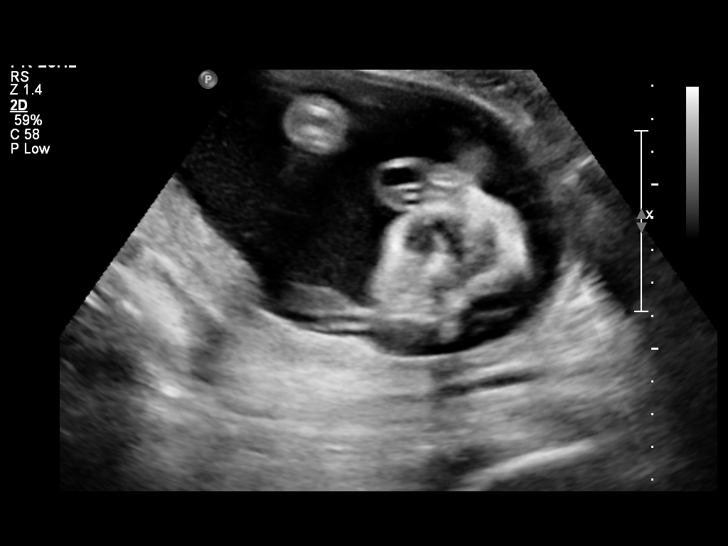
[im 20/29]
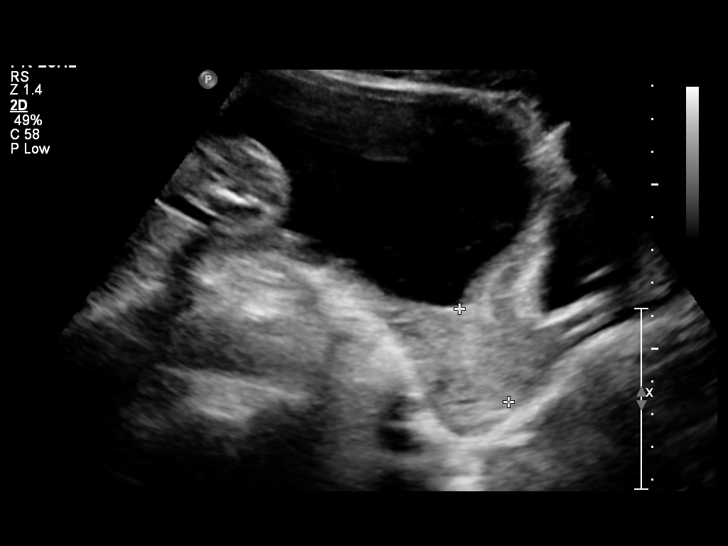
[im 22/29]
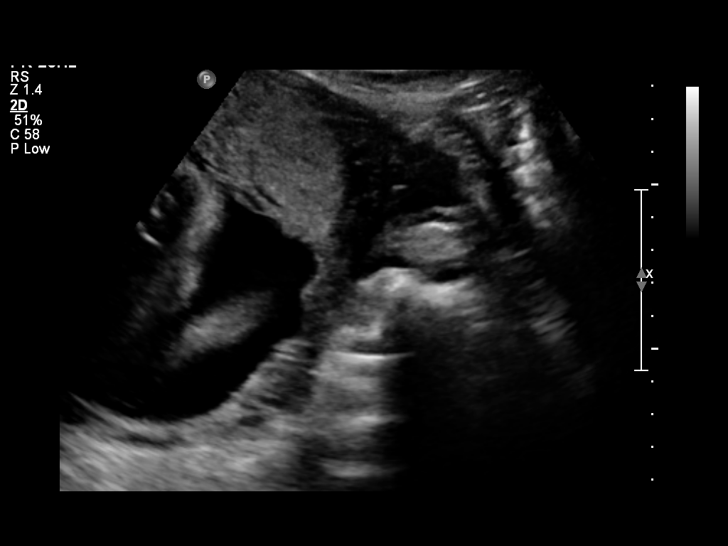
[im 24/29]
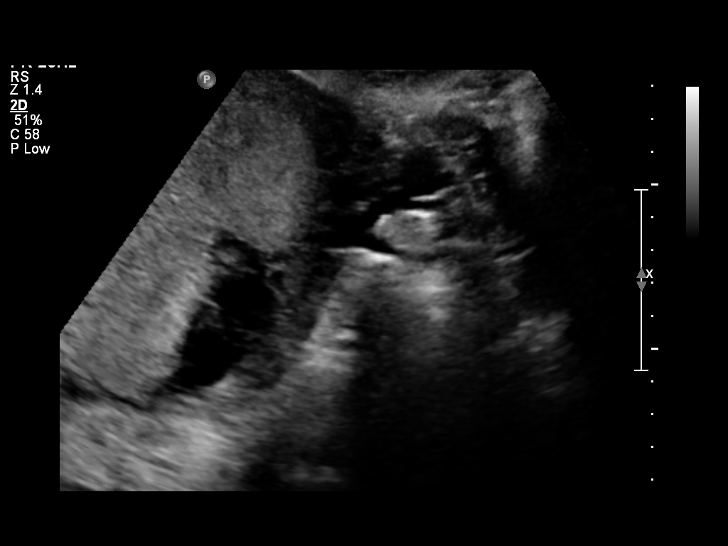
[im 26/29]
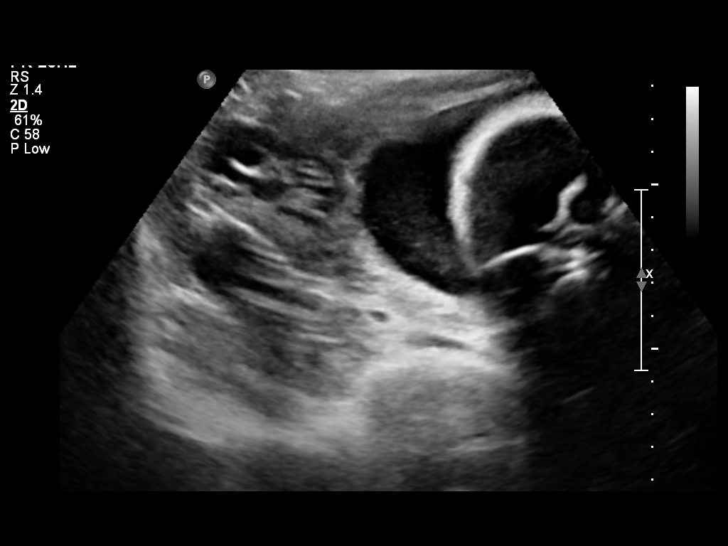
[im 29/29]
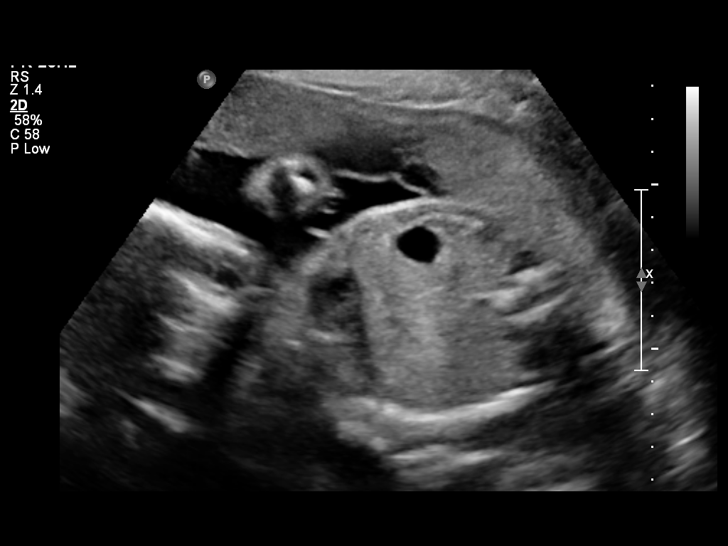

[14 of 28 positions shown; findings below may reference images not displayed]

IMPRESSION: See AS Obstetric US report.

## 2010-08-31 ENCOUNTER — Inpatient Hospital Stay (HOSPITAL_COMMUNITY): Admission: AD | Admit: 2010-08-31 | Discharge: 2010-08-31 | Payer: Self-pay | Admitting: Obstetrics & Gynecology

## 2010-08-31 ENCOUNTER — Ambulatory Visit: Payer: Self-pay | Admitting: Obstetrics and Gynecology

## 2010-09-04 ENCOUNTER — Ambulatory Visit: Payer: Self-pay | Admitting: Obstetrics & Gynecology

## 2010-09-18 ENCOUNTER — Ambulatory Visit: Payer: Self-pay | Admitting: Obstetrics & Gynecology

## 2010-09-18 ENCOUNTER — Encounter: Payer: Self-pay | Admitting: Obstetrics & Gynecology

## 2010-09-18 LAB — CONVERTED CEMR LAB
HCT: 31.3 % — ABNORMAL LOW (ref 36.0–46.0)
Hemoglobin: 10.2 g/dL — ABNORMAL LOW (ref 12.0–15.0)
MCHC: 32.6 g/dL (ref 30.0–36.0)
MCV: 85.1 fL (ref 78.0–100.0)
Platelets: 58 10*3/uL — ABNORMAL LOW (ref 150–400)
RBC: 3.68 M/uL — ABNORMAL LOW (ref 3.87–5.11)
RDW: 14.3 % (ref 11.5–15.5)
WBC: 7 10*3/uL (ref 4.0–10.5)

## 2010-09-22 ENCOUNTER — Ambulatory Visit: Payer: Self-pay | Admitting: Hematology and Oncology

## 2010-09-24 LAB — CBC WITH DIFFERENTIAL/PLATELET
BASO%: 0.3 % (ref 0.0–2.0)
EOS%: 0.7 % (ref 0.0–7.0)
HCT: 30 % — ABNORMAL LOW (ref 34.8–46.6)
LYMPH%: 20.9 % (ref 14.0–49.7)
MCH: 29.4 pg (ref 25.1–34.0)
MCHC: 34.9 g/dL (ref 31.5–36.0)
NEUT%: 71.5 % (ref 38.4–76.8)
RBC: 3.57 10*6/uL — ABNORMAL LOW (ref 3.70–5.45)
lymph#: 1.5 10*3/uL (ref 0.9–3.3)

## 2010-09-24 LAB — BASIC METABOLIC PANEL
Calcium: 8.2 mg/dL — ABNORMAL LOW (ref 8.4–10.5)
Chloride: 106 mEq/L (ref 96–112)
Creatinine, Ser: 0.63 mg/dL (ref 0.40–1.20)

## 2010-09-25 ENCOUNTER — Ambulatory Visit (HOSPITAL_COMMUNITY): Admission: RE | Admit: 2010-09-25 | Discharge: 2010-09-26 | Payer: Self-pay | Admitting: Obstetrics & Gynecology

## 2010-09-26 ENCOUNTER — Ambulatory Visit (HOSPITAL_COMMUNITY): Admission: AD | Admit: 2010-09-26 | Discharge: 2010-09-27 | Payer: Self-pay | Admitting: Obstetrics & Gynecology

## 2010-10-02 ENCOUNTER — Ambulatory Visit: Payer: Self-pay | Admitting: Obstetrics & Gynecology

## 2010-10-07 LAB — BASIC METABOLIC PANEL
BUN: 8 mg/dL (ref 6–23)
CO2: 27 mEq/L (ref 19–32)
Calcium: 8.4 mg/dL (ref 8.4–10.5)
Chloride: 105 mEq/L (ref 96–112)
Creatinine, Ser: 0.56 mg/dL (ref 0.40–1.20)
Glucose, Bld: 77 mg/dL (ref 70–99)
Potassium: 3.5 mEq/L (ref 3.5–5.3)
Sodium: 136 mEq/L (ref 135–145)

## 2010-10-07 LAB — CBC WITH DIFFERENTIAL/PLATELET
BASO%: 0.4 % (ref 0.0–2.0)
Basophils Absolute: 0 10*3/uL (ref 0.0–0.1)
EOS%: 0.8 % (ref 0.0–7.0)
Eosinophils Absolute: 0.1 10*3/uL (ref 0.0–0.5)
HCT: 30.1 % — ABNORMAL LOW (ref 34.8–46.6)
HGB: 10.5 g/dL — ABNORMAL LOW (ref 11.6–15.9)
LYMPH%: 19.3 % (ref 14.0–49.7)
MCH: 29.2 pg (ref 25.1–34.0)
MCHC: 34.7 g/dL (ref 31.5–36.0)
MCV: 84 fL (ref 79.5–101.0)
MONO#: 0.4 10*3/uL (ref 0.1–0.9)
MONO%: 5.6 % (ref 0.0–14.0)
NEUT#: 5.5 10*3/uL (ref 1.5–6.5)
NEUT%: 73.9 % (ref 38.4–76.8)
Platelets: 51 10*3/uL — ABNORMAL LOW (ref 145–400)
RBC: 3.58 10*6/uL — ABNORMAL LOW (ref 3.70–5.45)
RDW: 14.5 % (ref 11.2–14.5)
WBC: 7.5 10*3/uL (ref 3.9–10.3)
lymph#: 1.4 10*3/uL (ref 0.9–3.3)

## 2010-10-07 LAB — IRON AND TIBC
%SAT: 13 % — ABNORMAL LOW (ref 20–55)
Iron: 55 ug/dL (ref 42–145)
TIBC: 415 ug/dL (ref 250–470)
UIBC: 360 ug/dL

## 2010-10-07 LAB — FERRITIN: Ferritin: 9 ng/mL — ABNORMAL LOW (ref 10–291)

## 2010-10-10 ENCOUNTER — Ambulatory Visit (HOSPITAL_COMMUNITY)
Admission: RE | Admit: 2010-10-10 | Discharge: 2010-10-11 | Payer: Self-pay | Source: Home / Self Care | Admitting: Obstetrics & Gynecology

## 2010-10-16 ENCOUNTER — Encounter (INDEPENDENT_AMBULATORY_CARE_PROVIDER_SITE_OTHER): Payer: Self-pay | Admitting: *Deleted

## 2010-10-16 ENCOUNTER — Ambulatory Visit: Payer: Self-pay | Admitting: Obstetrics & Gynecology

## 2010-10-17 ENCOUNTER — Ambulatory Visit (HOSPITAL_COMMUNITY): Admission: RE | Admit: 2010-10-17 | Discharge: 2010-10-17 | Payer: Self-pay | Admitting: Obstetrics and Gynecology

## 2010-10-17 ENCOUNTER — Encounter: Payer: Self-pay | Admitting: Advanced Practice Midwife

## 2010-10-17 LAB — CONVERTED CEMR LAB
Chlamydia, DNA Probe: NEGATIVE
GC Probe Amp, Genital: NEGATIVE

## 2010-10-22 ENCOUNTER — Ambulatory Visit: Payer: Self-pay | Admitting: Hematology and Oncology

## 2010-10-22 LAB — BASIC METABOLIC PANEL
BUN: 7 mg/dL (ref 6–23)
CO2: 21 mEq/L (ref 19–32)
Calcium: 8.4 mg/dL (ref 8.4–10.5)
Glucose, Bld: 82 mg/dL (ref 70–99)
Sodium: 133 mEq/L — ABNORMAL LOW (ref 135–145)

## 2010-10-22 LAB — CBC WITH DIFFERENTIAL/PLATELET
Basophils Absolute: 0 10*3/uL (ref 0.0–0.1)
Eosinophils Absolute: 0 10*3/uL (ref 0.0–0.5)
HCT: 33 % — ABNORMAL LOW (ref 34.8–46.6)
HGB: 11.1 g/dL — ABNORMAL LOW (ref 11.6–15.9)
LYMPH%: 16 % (ref 14.0–49.7)
MCV: 86.3 fL (ref 79.5–101.0)
MONO#: 0.4 10*3/uL (ref 0.1–0.9)
NEUT#: 4.9 10*3/uL (ref 1.5–6.5)
NEUT%: 76.6 % (ref 38.4–76.8)
Platelets: 50 10*3/uL — ABNORMAL LOW (ref 145–400)
WBC: 6.5 10*3/uL (ref 3.9–10.3)

## 2010-10-23 ENCOUNTER — Ambulatory Visit: Payer: Self-pay | Admitting: Obstetrics & Gynecology

## 2010-10-28 ENCOUNTER — Inpatient Hospital Stay (HOSPITAL_COMMUNITY): Admission: AD | Admit: 2010-10-28 | Discharge: 2010-10-28 | Payer: Self-pay | Admitting: Obstetrics & Gynecology

## 2010-11-06 ENCOUNTER — Ambulatory Visit: Payer: Self-pay | Admitting: Obstetrics & Gynecology

## 2010-11-06 ENCOUNTER — Encounter: Payer: Self-pay | Admitting: Physician Assistant

## 2010-11-06 LAB — CONVERTED CEMR LAB: Platelets: 60 10*3/uL — ABNORMAL LOW (ref 150–400)

## 2010-11-10 ENCOUNTER — Inpatient Hospital Stay (HOSPITAL_COMMUNITY): Admission: RE | Admit: 2010-11-10 | Discharge: 2010-11-13 | Payer: Self-pay | Admitting: Obstetrics & Gynecology

## 2010-11-10 ENCOUNTER — Ambulatory Visit: Payer: Self-pay | Admitting: Obstetrics and Gynecology

## 2010-12-11 ENCOUNTER — Ambulatory Visit: Payer: Self-pay | Admitting: Obstetrics & Gynecology

## 2010-12-17 ENCOUNTER — Ambulatory Visit: Payer: Self-pay | Admitting: Obstetrics and Gynecology

## 2010-12-18 ENCOUNTER — Ambulatory Visit: Payer: Self-pay | Admitting: Obstetrics and Gynecology

## 2010-12-31 ENCOUNTER — Ambulatory Visit: Payer: Self-pay | Admitting: Hematology and Oncology

## 2011-01-18 ENCOUNTER — Encounter: Payer: Self-pay | Admitting: *Deleted

## 2011-01-28 NOTE — Consult Note (Signed)
Summary: [facility]: [facility]  [facility] Hematology/Oncology   Imported By: Clydell Hakim 02/10/2010 11:50:22  _____________________________________________________________________  External Attachment:    Type:   Image     Comment:   External Document

## 2011-03-10 LAB — CBC
HCT: 27.4 % — ABNORMAL LOW (ref 36.0–46.0)
HCT: 30.8 % — ABNORMAL LOW (ref 36.0–46.0)
Hemoglobin: 9.5 g/dL — ABNORMAL LOW (ref 12.0–15.0)
MCH: 30.2 pg (ref 26.0–34.0)
MCH: 30.5 pg (ref 26.0–34.0)
MCHC: 34.2 g/dL (ref 30.0–36.0)
MCHC: 34.7 g/dL (ref 30.0–36.0)
MCV: 88.4 fL (ref 78.0–100.0)
MCV: 89.7 fL (ref 78.0–100.0)
Platelets: 43 10*3/uL — ABNORMAL LOW (ref 150–400)
Platelets: 50 10*3/uL — ABNORMAL LOW (ref 150–400)
RBC: 3.08 MIL/uL — ABNORMAL LOW (ref 3.87–5.11)
RBC: 3.43 MIL/uL — ABNORMAL LOW (ref 3.87–5.11)
RDW: 17.3 % — ABNORMAL HIGH (ref 11.5–15.5)

## 2011-03-10 LAB — POCT URINALYSIS DIPSTICK
Glucose, UA: NEGATIVE mg/dL
Hgb urine dipstick: NEGATIVE
Nitrite: NEGATIVE
Protein, ur: NEGATIVE mg/dL
Specific Gravity, Urine: 1.025 (ref 1.005–1.030)
Urobilinogen, UA: 2 mg/dL — ABNORMAL HIGH (ref 0.0–1.0)
pH: 7 (ref 5.0–8.0)

## 2011-03-10 LAB — PREPARE PLATELETS: Unit division: 0

## 2011-03-10 LAB — CROSSMATCH

## 2011-03-10 LAB — RPR: RPR Ser Ql: NONREACTIVE

## 2011-03-11 LAB — POCT URINALYSIS DIPSTICK
Hgb urine dipstick: NEGATIVE
Hgb urine dipstick: NEGATIVE
Nitrite: NEGATIVE
Protein, ur: NEGATIVE mg/dL
Protein, ur: NEGATIVE mg/dL
Specific Gravity, Urine: 1.02 (ref 1.005–1.030)
Specific Gravity, Urine: 1.025 (ref 1.005–1.030)
Urobilinogen, UA: 0.2 mg/dL (ref 0.0–1.0)
Urobilinogen, UA: 4 mg/dL — ABNORMAL HIGH (ref 0.0–1.0)
pH: 5.5 (ref 5.0–8.0)
pH: 6 (ref 5.0–8.0)

## 2011-03-12 LAB — URINE MICROSCOPIC-ADD ON

## 2011-03-12 LAB — POCT URINALYSIS DIPSTICK
Bilirubin Urine: NEGATIVE
Bilirubin Urine: NEGATIVE
Glucose, UA: NEGATIVE mg/dL
Glucose, UA: NEGATIVE mg/dL
Hgb urine dipstick: NEGATIVE
Hgb urine dipstick: NEGATIVE
Ketones, ur: NEGATIVE mg/dL
Nitrite: NEGATIVE
Nitrite: NEGATIVE
Nitrite: NEGATIVE
Protein, ur: NEGATIVE mg/dL
Specific Gravity, Urine: 1.01 (ref 1.005–1.030)
Urobilinogen, UA: 0.2 mg/dL (ref 0.0–1.0)
pH: 6 (ref 5.0–8.0)
pH: 8.5 — ABNORMAL HIGH (ref 5.0–8.0)

## 2011-03-12 LAB — URINALYSIS, ROUTINE W REFLEX MICROSCOPIC
Bilirubin Urine: NEGATIVE
Hgb urine dipstick: NEGATIVE
Ketones, ur: NEGATIVE mg/dL
Nitrite: NEGATIVE
Urobilinogen, UA: 0.2 mg/dL (ref 0.0–1.0)

## 2011-03-13 LAB — POCT URINALYSIS DIPSTICK
Bilirubin Urine: NEGATIVE
Ketones, ur: NEGATIVE mg/dL
Protein, ur: NEGATIVE mg/dL
Specific Gravity, Urine: 1.015 (ref 1.005–1.030)

## 2011-03-15 LAB — URINE MICROSCOPIC-ADD ON

## 2011-03-15 LAB — URINALYSIS, ROUTINE W REFLEX MICROSCOPIC
Nitrite: NEGATIVE
Specific Gravity, Urine: 1.015 (ref 1.005–1.030)
Urobilinogen, UA: 0.2 mg/dL (ref 0.0–1.0)
pH: 6.5 (ref 5.0–8.0)

## 2011-03-15 LAB — POCT PREGNANCY, URINE: Preg Test, Ur: POSITIVE

## 2011-03-15 LAB — WET PREP, GENITAL

## 2011-03-15 LAB — GC/CHLAMYDIA PROBE AMP, GENITAL: Chlamydia, DNA Probe: POSITIVE — AB

## 2011-05-15 NOTE — Discharge Summary (Signed)
[name], [name] [name]                        ACCOUNT NO.:  [id number]   MEDICAL RECORD NO.:  [id number]                   PATIENT TYPE:  INP   LOCATION:  9302                                 FACILITY:  [facility]   PHYSICIAN:  Huel Cote, M.D.              DATE OF BIRTH:  May 20, 1986   DATE OF ADMISSION:  08/02/2004  DATE OF DISCHARGE:  08/04/2004                                 DISCHARGE SUMMARY   DISCHARGE DIAGNOSES:  Pyelonephritis.   DISCHARGE MEDICATIONS:  1. Keflex 500 mg p.o. 3 times daily.  2. Percocet 1-2 tablets p.o. every 4h. p.r.n.   DISCHARGE FOLLOWUP:  The patient is to followup in the office in  approximately one week.   HOSPITAL COURSE:  The patient is a 25 year old, G2, P2 who presented with  probable pyelonephritis and a temperature of 103.  Urine and urine culture  were consistent with pyelonephritis therefore the patient was admitted and  placed on IV Rocephin given her high temperature and per the patient she had  no ability to purchase p.o. antibiotics. The patient was given IV Rocephin  for greater than 24 hours, received 2 doses of 2 g IV and her temperature  decreased to 100.5.  Following this treatment, the patient was felt stable  for discharge home and pharmacy was contacted with social work assistance to  aid the patient in acquiring her p.o. antibiotics for the remainder of her  therapy therefore she was discharged with her Keflex and Percocet with  instructions to followup in the next week.                                               Huel Cote, M.D.    KR/MEDQ  D:  08/24/2004  T:  08/25/2004  Job:  [id number]

## 2011-05-15 NOTE — Discharge Summary (Signed)
NAMECYRIL, [name] [name]            ACCOUNT NO.:  [id number]   MEDICAL RECORD NO.:  [id number]          PATIENT TYPE:  WOC   LOCATION:  WOC                          FACILITY:  [facility]   PHYSICIAN:  Tanya S. Shawnie Pons, M.D.   DATE OF BIRTH:  03/06/1986   DATE OF ADMISSION:  04/23/2005  DATE OF DISCHARGE:  04/26/2005                                 DISCHARGE SUMMARY   DISCHARGE DIAGNOSES:  1.  Induction of labor at 39-1/7 weeks.  2.  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  3.  Normal spontaneous vaginal delivery of a term female.  4.  Anemia.  5.  Rubella immune.  6.  GBS positive, treated with penicillin G intra-partum.   DISCHARGE MEDICATIONS:  1.  Darvocet N-100 1 tablet p.o. q. 4 hours p.r.n. pain and cramps.  2.  Prenatal vitamins 1 tablet p.o. q.d. x6 weeks or until completed breast      feeding.  3.  Prednisone 20 mg 1 and 1/2 tabs p.o. b.i.d.   DISCHARGE LABORATORY DATA:  On April 26, 2005 her CBC showed a white count  of 13.2, hemoglobin of 9.6, hematocrit 28.1, and platelet count of 80,000.   HOSPITAL COURSE:  [name] [name] was a 25 year old now G3,P3-0-0-3 that presented  for induction of labor on April 23, 2005. Her induction of labor was for  idiopathic thrombocytopenic purpura. Her platelet count had gone down to  63,000 and she was on prednisone 30 mg b.i.d. Intra-partum, [name] [name] was  given penicillin G for GBS positive status. She was placed on Pitocin for  augmentation of labor. On April 24, 2005 under epidural anesthesia, she  delivered a term viable female with Apgar's of 8 at 1 and 9 at 1. He did have  a nuchal cord, which was reduced after delivery. Intact 3 vessel cord  placenta was delivered soon thereafter. She did not have any lacerations.  Her estimated blood loss was 250 cc. Her post-partum course was  uncomplicated.   FOLLOWUP INSTRUCTIONS:  1.  [name] [name] is still undecided about contraception and I have given  her a      list of contraceptive options to discuss at her postpartum visit at 6      weeks at [facility].  2.  She does have a followup appointment with Dr. Dalene Carrow on Apr 30, 2005 and      will need platelet count rechecked and also review of her dosing of      prednisone, which is currently at 30 mg b.i.d.  3.  Discharge platelet count is 80,000 on April 26, 2005.      KB/MEDQ  D:  04/26/2005  T:  04/27/2005  Job:  [id number]   cc:   Vicente Serene I. Odogwu, M.D.  Fax: [phone]

## 2011-06-11 ENCOUNTER — Emergency Department (HOSPITAL_COMMUNITY)
Admission: EM | Admit: 2011-06-11 | Discharge: 2011-06-11 | Disposition: A | Discharge status: Home / Self Care | Payer: Self-pay | Attending: Emergency Medicine | Admitting: Emergency Medicine

## 2011-06-11 ENCOUNTER — Emergency Department (HOSPITAL_COMMUNITY): Payer: Self-pay

## 2011-06-11 DIAGNOSIS — H9209 Otalgia, unspecified ear: Secondary | ICD-10-CM | POA: Insufficient documentation

## 2011-06-11 DIAGNOSIS — R509 Fever, unspecified: Secondary | ICD-10-CM | POA: Insufficient documentation

## 2011-06-11 DIAGNOSIS — R51 Headache: Secondary | ICD-10-CM | POA: Insufficient documentation

## 2011-06-11 DIAGNOSIS — I889 Nonspecific lymphadenitis, unspecified: Secondary | ICD-10-CM | POA: Insufficient documentation

## 2011-06-11 LAB — DIFFERENTIAL
Basophils Absolute: 0 10*3/uL (ref 0.0–0.1)
Eosinophils Relative: 3 % (ref 0–5)
Lymphocytes Relative: 38 % (ref 12–46)
Lymphs Abs: 2.2 10*3/uL (ref 0.7–4.0)
Neutrophils Relative %: 54 % (ref 43–77)

## 2011-06-11 LAB — BASIC METABOLIC PANEL
Chloride: 103 mEq/L (ref 96–112)
GFR calc Af Amer: >60 mL/min (ref >60)
GFR calc non Af Amer: >60 mL/min (ref >60)
Glucose, Bld: 87 mg/dL (ref 70–99)
Potassium: 3.4 mEq/L — ABNORMAL LOW (ref 3.5–5.1)
Sodium: 139 mEq/L (ref 135–145)

## 2011-06-11 LAB — CBC
HCT: 37.7 % (ref 36.0–46.0)
Platelets: 113 10*3/uL — ABNORMAL LOW (ref 150–400)
RBC: 4.71 MIL/uL (ref 3.87–5.11)
RDW: 13.7 % (ref 11.5–15.5)
WBC: 5.9 10*3/uL (ref 4.0–10.5)

## 2011-06-11 IMAGING — CT CT PARANASAL SINUSES LIMITED
3 of 5 series · 9 of 20 positions shown, 10 images · IV contrast (omnipaque)
Comparison: None.

CT HEAD
COMPARISON: None.

CLINICAL DATA: The infection

CT HEAD WITHOUT CONTRAST
CT MAXILLOFACIAL WITHOUT CONTRAST
TECHNIQUE: Multidetector CT imaging of the head and maxillofacial
structures were performed using the standard protocol without
intravenous contrast. Multiplanar CT image reconstructions of the
maxillofacial structures were also generated.
CLINICAL DATA: Infection
CT NECK WITH CONTRAST
TECHNIQUE: Multidetector CT imaging of the neck was performed
using the standard protocol following the bolus administration of
intravenous contrast.
Contrast: 100 ml Omnipaque 300

[Series 8: neck rtn st · axial · 0.39mm/px · z∈[-274,-187]mm · 2 of 87 slices shown]
[im 29/87  bone]
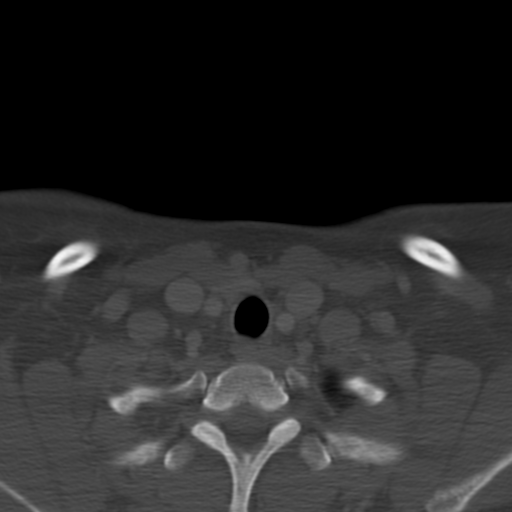
[im 58/87  bone]
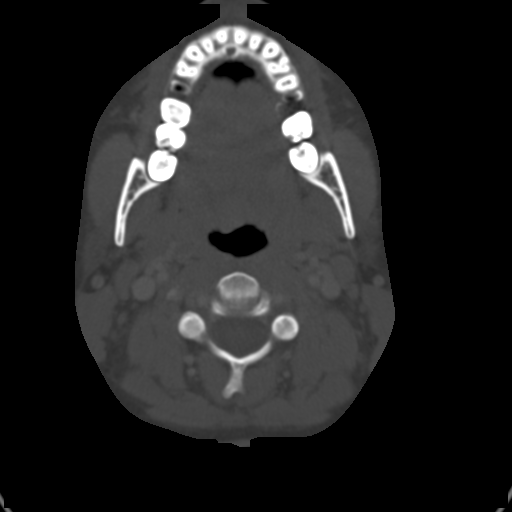

[Series 602: <mpr range> · axial · 0.51mm/px · z∈[-318,-161]mm · 4 of 130 slices shown, 5 images]
[im 26/130  soft-tissue]
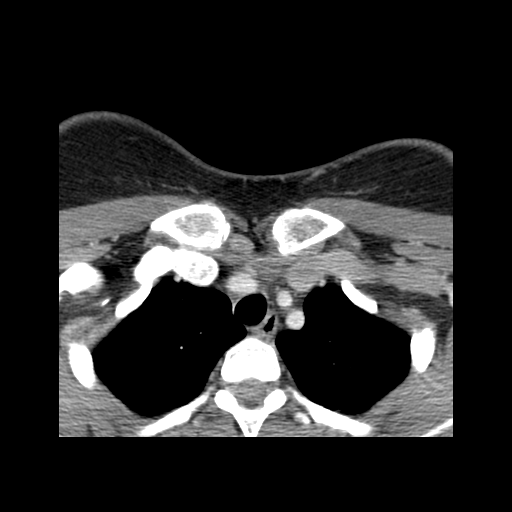
[im 26/130  bone]
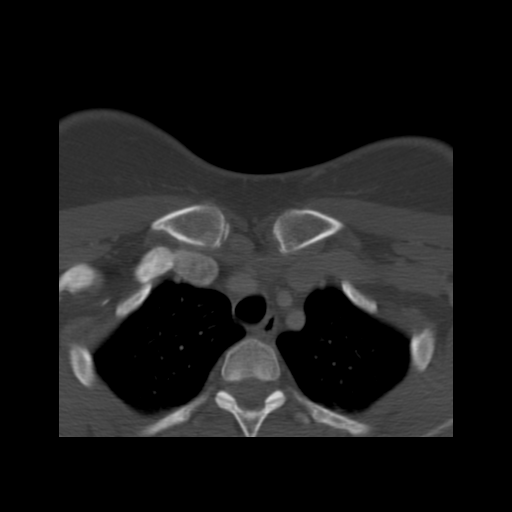
[im 52/130  bone]
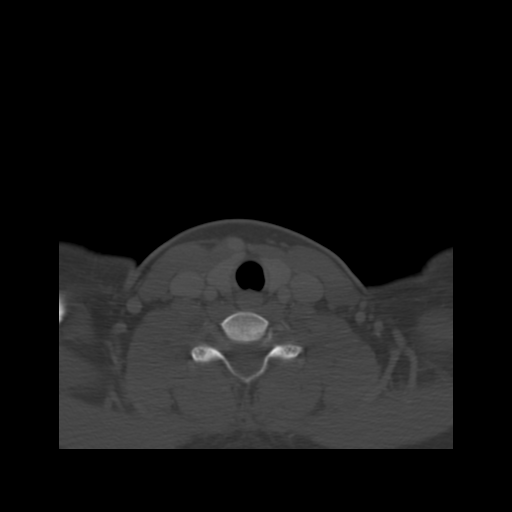
[im 78/130  bone]
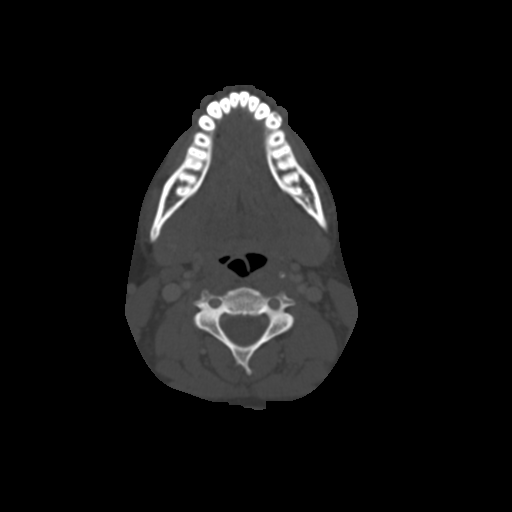
[im 104/130  bone]
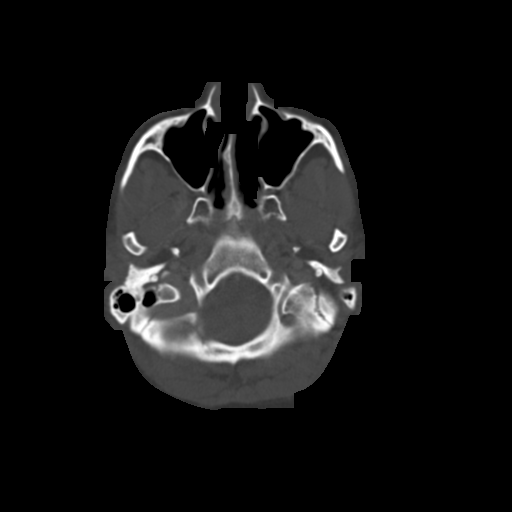

[Series 603: <mpr range(1)> · coronal · 0.51mm/px · 3 of 93 slices shown]
[im 19/93  bone]
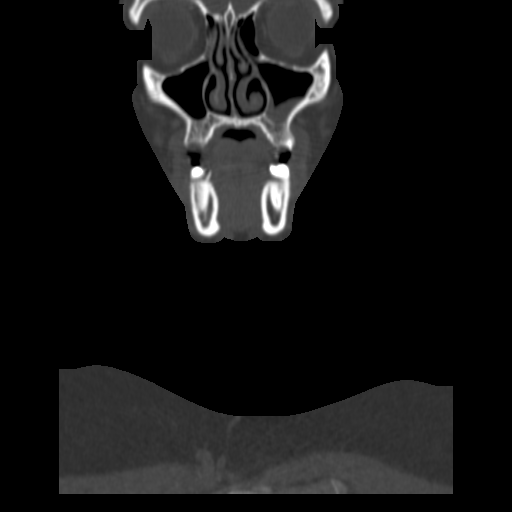
[im 37/93  bone]
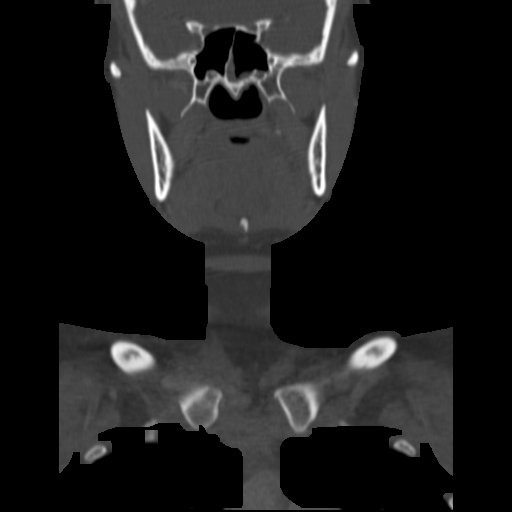
[im 56/93  bone]
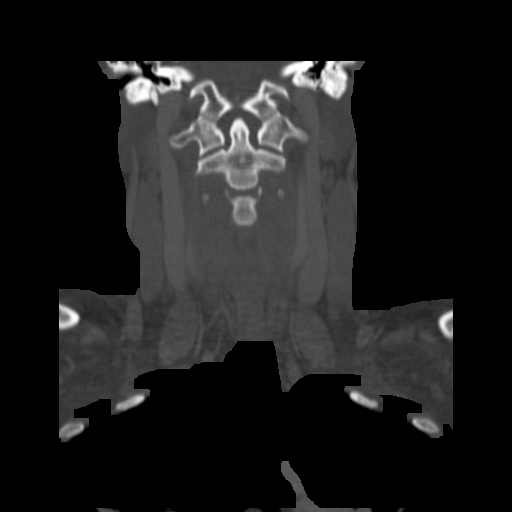

[9 of 20 positions shown; findings below may reference images not displayed]

FINDINGS: No acute infarct, hemorrhage, or mass lesion is present.
The ventricles are normal size.  No significant extra-axial fluid
collection is present.  A single anterior right ethmoid air cell is
opacified.  Mild mucosal thickening is present within the left
sphenoid sinus.  There is no air-fluid level.  Mastoid air cells
are clear.  The osseous skull is intact.
IMPRESSION: 1.  Normal CT appearance of the brain.
2.  Opacification of a single right anterior ethmoid air cell.
3.  Mucosal thickening in the left sphenoid sinus without an air-
fluid level to suggest acute disease.

CT MAXILLOFACIAL
FINDINGS: Circumferential mucosal thickening is present
inferiorly in the left maxillary sinus.  There is opacification of
a single right anterior ethmoid air cell.  Mild mucosal thickening
is present within the left sphenoid sinus.  The remaining paranasal
sinuses and mastoid air cells are clear.  The soft tissues are
unremarkable.
IMPRESSION: 1.  Mild mucosal thickening within the left maxillary and sphenoid
sinus.  There are no air-fluid levels to suggest acute disease.
2.  Opacification of a single anterior right ethmoid air cell is
nonspecific.
FINDINGS: No focal mucosal or submucosal lesions are present.
There is mild prominence of the adenoid tissue.  No focal mass
lesion is evident.  The vocal cords are midline symmetric.  The
thyroid is unremarkable.

The lung apices are clear.

There is mild prominence of level II lymph nodes bilaterally.
These are likely reactive.  No pathologic nodes are evident.

The bone windows are unremarkable.
IMPRESSION: 1.  Mild prominence of level II lymph nodes bilaterally.  Likely
within normal limits for age.
2.  Prominence of the adenoid and tonsillar tissue without a focal
mass lesion or abscess.

## 2011-06-11 MED ORDER — IOHEXOL 300 MG/ML  SOLN: 100.0000 mL | Freq: Once | INTRAMUSCULAR | Status: DC | PRN

## 2011-07-20 ENCOUNTER — Ambulatory Visit: Payer: Self-pay | Admitting: Family Medicine

## 2011-09-16 LAB — POCT URINALYSIS DIP (DEVICE)
Bilirubin Urine: NEGATIVE
Glucose, UA: NEGATIVE
Ketones, ur: NEGATIVE
Operator id: 297281
Protein, ur: NEGATIVE
Specific Gravity, Urine: 1.02

## 2011-09-17 LAB — COMPREHENSIVE METABOLIC PANEL
ALT: 15
AST: 20
Alkaline Phosphatase: 68
CO2: 21
Chloride: 103
GFR calc Af Amer: >60
GFR calc non Af Amer: >60
Potassium: 3.3 — ABNORMAL LOW
Sodium: 133 — ABNORMAL LOW
Total Bilirubin: 0.6

## 2011-09-17 LAB — URINALYSIS, ROUTINE W REFLEX MICROSCOPIC
Glucose, UA: NEGATIVE
Specific Gravity, Urine: 1.025
pH: 6.5

## 2011-09-17 LAB — POCT URINALYSIS DIP (DEVICE)
Glucose, UA: NEGATIVE
Operator id: 297281
Specific Gravity, Urine: >=1.03

## 2011-09-17 LAB — CBC
RBC: 3.65 — ABNORMAL LOW
WBC: 10.4

## 2011-09-17 LAB — URINE MICROSCOPIC-ADD ON

## 2011-09-18 LAB — POCT URINALYSIS DIP (DEVICE)
Hgb urine dipstick: NEGATIVE
Ketones, ur: NEGATIVE
Ketones, ur: 80 — AB
Ketones, ur: NEGATIVE
Protein, ur: NEGATIVE
Protein, ur: 30 — AB
Protein, ur: NEGATIVE
Specific Gravity, Urine: 1.015
Specific Gravity, Urine: >=1.03
Urobilinogen, UA: 0.2
pH: 8.5 — ABNORMAL HIGH
pH: 6.5
pH: 7

## 2011-09-18 LAB — URINE MICROSCOPIC-ADD ON

## 2011-09-18 LAB — WET PREP, GENITAL
Clue Cells Wet Prep HPF POC: NONE SEEN
Trich, Wet Prep: NONE SEEN

## 2011-09-18 LAB — URINALYSIS, ROUTINE W REFLEX MICROSCOPIC
Bilirubin Urine: NEGATIVE
Ketones, ur: 15 — AB
Nitrite: NEGATIVE
pH: 7

## 2011-09-18 LAB — GC/CHLAMYDIA PROBE AMP, GENITAL: Chlamydia, DNA Probe: NEGATIVE

## 2011-09-21 ENCOUNTER — Encounter: Payer: Self-pay | Admitting: *Deleted

## 2011-09-21 DIAGNOSIS — D693 Immune thrombocytopenic purpura: Secondary | ICD-10-CM | POA: Insufficient documentation

## 2011-09-21 LAB — CBC
HCT: 30.8 — ABNORMAL LOW
Hemoglobin: 10.6 — ABNORMAL LOW
MCHC: 34.1
MCHC: 34.7
MCV: 84.2
MCV: 84.9
Platelets: 53 — ABNORMAL LOW
Platelets: 62 — ABNORMAL LOW
RBC: 3.4 — ABNORMAL LOW
RDW: 14.5
RDW: 14.5
WBC: 17 — ABNORMAL HIGH

## 2011-09-21 LAB — POCT URINALYSIS DIP (DEVICE)
Ketones, ur: NEGATIVE
Protein, ur: 30 — AB
Specific Gravity, Urine: 1.02
pH: 7

## 2011-09-23 LAB — COMPREHENSIVE METABOLIC PANEL
BUN: 9
CO2: 25
Calcium: 7.9 — ABNORMAL LOW
Creatinine, Ser: 0.82
GFR calc non Af Amer: >60
Glucose, Bld: 97
Total Protein: 5.9 — ABNORMAL LOW

## 2011-09-23 LAB — URINALYSIS, ROUTINE W REFLEX MICROSCOPIC
Glucose, UA: NEGATIVE
Nitrite: NEGATIVE
Protein, ur: NEGATIVE
Urobilinogen, UA: 2 — ABNORMAL HIGH

## 2011-09-23 LAB — CBC
HCT: 31.5 — ABNORMAL LOW
Hemoglobin: 10.7 — ABNORMAL LOW
MCHC: 33.9
MCV: 81.6
RBC: 3.86 — ABNORMAL LOW
RDW: 14.4

## 2011-09-23 LAB — DIFFERENTIAL
Eosinophils Absolute: 0
Lymphs Abs: 1.3
Monocytes Relative: 10
Neutro Abs: 6.6
Neutrophils Relative %: 75

## 2011-09-23 LAB — URINE MICROSCOPIC-ADD ON

## 2011-09-24 ENCOUNTER — Ambulatory Visit: Payer: Self-pay | Admitting: Obstetrics & Gynecology

## 2011-09-24 LAB — RAPID STREP SCREEN (MED CTR MEBANE ONLY): Streptococcus, Group A Screen (Direct): NEGATIVE

## 2011-09-24 LAB — STREP A DNA PROBE

## 2011-10-02 LAB — WET PREP, GENITAL
Trich, Wet Prep: NONE SEEN
Yeast Wet Prep HPF POC: NONE SEEN

## 2011-10-02 LAB — POCT URINALYSIS DIP (DEVICE)
Glucose, UA: NEGATIVE
Hgb urine dipstick: NEGATIVE
Ketones, ur: NEGATIVE
Operator id: 135281
Specific Gravity, Urine: >=1.03

## 2011-10-02 LAB — URINALYSIS, ROUTINE W REFLEX MICROSCOPIC
Bilirubin Urine: NEGATIVE
Glucose, UA: NEGATIVE
Hgb urine dipstick: NEGATIVE
Ketones, ur: NEGATIVE mg/dL
Nitrite: NEGATIVE
Specific Gravity, Urine: 1.025 (ref 1.005–1.030)
Urobilinogen, UA: 1 mg/dL (ref 0.0–1.0)
pH: 6 (ref 5.0–8.0)
pH: 7

## 2011-10-02 LAB — CBC
Platelets: 69 10*3/uL — ABNORMAL LOW (ref 150–400)
RBC: 4.09 MIL/uL (ref 3.87–5.11)
WBC: 8.1 10*3/uL (ref 4.0–10.5)

## 2011-10-02 LAB — URINE MICROSCOPIC-ADD ON: RBC / HPF: NONE SEEN

## 2011-10-02 LAB — URINE CULTURE: Colony Count: 45000

## 2011-10-02 LAB — DIFFERENTIAL
Eosinophils Absolute: 0.1 10*3/uL (ref 0.0–0.7)
Lymphs Abs: 1.8 10*3/uL (ref 0.7–4.0)
Neutro Abs: 5.9 10*3/uL (ref 1.7–7.7)
Neutrophils Relative %: 72 % (ref 43–77)

## 2011-10-02 LAB — PREGNANCY, URINE: Preg Test, Ur: POSITIVE

## 2011-10-02 LAB — GC/CHLAMYDIA PROBE AMP, GENITAL: GC Probe Amp, Genital: NEGATIVE

## 2011-10-06 LAB — URINALYSIS, ROUTINE W REFLEX MICROSCOPIC
Glucose, UA: NEGATIVE
Protein, ur: NEGATIVE
Specific Gravity, Urine: 1.01
pH: 7.5

## 2011-10-06 LAB — WET PREP, GENITAL

## 2011-10-06 LAB — URINE MICROSCOPIC-ADD ON

## 2011-10-06 LAB — RAPID URINE DRUG SCREEN, HOSP PERFORMED
Amphetamines: NOT DETECTED
Barbiturates: NOT DETECTED

## 2011-10-06 LAB — CBC
HCT: 32.7 — ABNORMAL LOW
Platelets: 58 — ABNORMAL LOW
RDW: 14.6 — ABNORMAL HIGH

## 2011-10-06 LAB — GC/CHLAMYDIA PROBE AMP, URINE: Chlamydia, Swab/Urine, PCR: NEGATIVE

## 2011-10-08 LAB — URINE CULTURE
Colony Count: NO GROWTH
Culture: NO GROWTH

## 2011-10-08 LAB — URINE MICROSCOPIC-ADD ON

## 2011-10-08 LAB — CBC
HCT: 32.5 — ABNORMAL LOW
Hemoglobin: 11.5 — ABNORMAL LOW
MCHC: 35.4
MCV: 85.9
RDW: 14.3 — ABNORMAL HIGH

## 2011-10-08 LAB — URINALYSIS, ROUTINE W REFLEX MICROSCOPIC
Bilirubin Urine: NEGATIVE
Glucose, UA: NEGATIVE
Hgb urine dipstick: NEGATIVE
Ketones, ur: NEGATIVE
Nitrite: NEGATIVE
Protein, ur: NEGATIVE
Specific Gravity, Urine: 1.01
Urobilinogen, UA: 1
pH: 8

## 2011-10-09 LAB — URINALYSIS, ROUTINE W REFLEX MICROSCOPIC
Glucose, UA: NEGATIVE
pH: 6.5

## 2011-10-09 LAB — CBC
HCT: 37
MCHC: 35.2
MCV: 83.8
Platelets: 92 — ABNORMAL LOW
RBC: 4.42

## 2011-10-09 LAB — WET PREP, GENITAL: Yeast Wet Prep HPF POC: NONE SEEN

## 2011-11-07 ENCOUNTER — Inpatient Hospital Stay (HOSPITAL_COMMUNITY)
Admission: AD | Admit: 2011-11-07 | Discharge: 2011-11-07 | Disposition: A | Discharge status: Home / Self Care | Payer: Self-pay | Source: Ambulatory Visit | Attending: Obstetrics & Gynecology | Admitting: Obstetrics & Gynecology

## 2011-11-07 ENCOUNTER — Encounter (HOSPITAL_COMMUNITY): Payer: Self-pay

## 2011-11-07 DIAGNOSIS — R109 Unspecified abdominal pain: Secondary | ICD-10-CM | POA: Insufficient documentation

## 2011-11-07 DIAGNOSIS — T148XXA Other injury of unspecified body region, initial encounter: Secondary | ICD-10-CM

## 2011-11-07 DIAGNOSIS — N938 Other specified abnormal uterine and vaginal bleeding: Secondary | ICD-10-CM | POA: Insufficient documentation

## 2011-11-07 DIAGNOSIS — R1084 Generalized abdominal pain: Secondary | ICD-10-CM

## 2011-11-07 DIAGNOSIS — N898 Other specified noninflammatory disorders of vagina: Secondary | ICD-10-CM

## 2011-11-07 DIAGNOSIS — M549 Dorsalgia, unspecified: Secondary | ICD-10-CM | POA: Insufficient documentation

## 2011-11-07 DIAGNOSIS — N949 Unspecified condition associated with female genital organs and menstrual cycle: Secondary | ICD-10-CM | POA: Insufficient documentation

## 2011-11-07 DIAGNOSIS — N939 Abnormal uterine and vaginal bleeding, unspecified: Secondary | ICD-10-CM

## 2011-11-07 LAB — CBC
HCT: 35.9 % — ABNORMAL LOW (ref 36.0–46.0)
Hemoglobin: 11.5 g/dL — ABNORMAL LOW (ref 12.0–15.0)
MCH: 26.7 pg (ref 26.0–34.0)
MCHC: 32 g/dL (ref 30.0–36.0)
MCV: 83.3 fL (ref 78.0–100.0)
Platelets: 110 10*3/uL — ABNORMAL LOW (ref 150–400)
RBC: 4.31 MIL/uL (ref 3.87–5.11)
RDW: 14 % (ref 11.5–15.5)
WBC: 5.9 10*3/uL (ref 4.0–10.5)

## 2011-11-07 LAB — URINE MICROSCOPIC-ADD ON: WBC, UA: NONE SEEN WBC/hpf (ref <3)

## 2011-11-07 LAB — URINALYSIS, ROUTINE W REFLEX MICROSCOPIC
Bilirubin Urine: NEGATIVE
Glucose, UA: NEGATIVE mg/dL
Ketones, ur: NEGATIVE mg/dL
Leukocytes, UA: NEGATIVE
Protein, ur: NEGATIVE mg/dL

## 2011-11-07 LAB — WET PREP, GENITAL
Clue Cells Wet Prep HPF POC: NONE SEEN
Trich, Wet Prep: NONE SEEN
Yeast Wet Prep HPF POC: NONE SEEN

## 2011-11-07 NOTE — Progress Notes (Signed)
Onset of abnormal vaginal bleeding since having baby last year, lower back pain and abdominal pain x 2 months, dizziness x 2 months with diarrhea x 1 month, LMP 10/29

## 2011-11-07 NOTE — ED Provider Notes (Signed)
History     CSN: [id number] Arrival date & time: 11/07/2011  9:10 AM   None     Chief Complaint  Patient presents with  . Vaginal Bleeding  . Back Pain  . Abdominal Pain  . Dizziness  . Diarrhea    (Consider location/radiation/quality/duration/timing/severity/associated sxs/prior treatment) Vaginal Bleeding The patient's primary symptoms include pelvic pain and a vaginal discharge. Associated symptoms include abdominal pain, back pain and diarrhea. Pertinent negatives include no chills, dysuria, fever or flank pain.  Back Pain Associated symptoms include abdominal pain and pelvic pain. Pertinent negatives include no dysuria or fever.  Abdominal Pain Associated symptoms include diarrhea. Pertinent negatives include no dysuria or fever.  Diarrhea  Associated symptoms include abdominal pain. Pertinent negatives include no chills or fever.   [name] [name] is a 25 y.o. [id number]. She c/o bil low abd pain off/on ,esp with movement, x 2-3 months. She has continued spotting since her delivery 11/11/10, received Depo Provera pp, none since. Has had 2 nl periods, LMP 10/28. Not using anything for birth control. Has had diarrhea or constipation x 2-3 months. Has slight bloody discharge, no odor or itching.  Past Medical History  Diagnosis Date  . ITP (idiopathic thrombocytopenic purpura)     Past Surgical History  Procedure Date  . Surgical repair to wrist     Family History  Problem Relation Age of Onset  . Cancer Father     leukemia    History  Substance Use Topics  . Smoking status: Current Some Day Smoker  . Smokeless tobacco: Not on file  . Alcohol Use: No    OB History    Grav Para Term Preterm Abortions TAB SAB Ect Mult Living   8 6 6  0 2 1 1   6       Review of Systems  Constitutional: Negative for fever, chills and activity change.  Gastrointestinal: Positive for abdominal pain and diarrhea. Negative for abdominal distention.       Constipation and diarrhea    Genitourinary: Positive for vaginal bleeding, vaginal discharge, menstrual problem and pelvic pain. Negative for dysuria, flank pain and dyspareunia.  Musculoskeletal: Positive for back pain.    Allergies  Review of patient's allergies indicates not on file.  Home Medications  No current outpatient prescriptions on file.  BP 134/79  Pulse 86  Temp(Src) 97.5 F (36.4 C) (Oral)  Resp 16  Ht 5\' 7"  (1.702 m)  Wt 96.616 kg (213 lb)  BMI 33.36 kg/m2  LMP 10/26/2011  Breastfeeding? Unknown  Physical Exam  Constitutional: She is oriented to person, place, and time. She appears well-developed and well-nourished.  Abdominal: Soft. Bowel sounds are normal. She exhibits no distension and no mass. There is no tenderness. There is no rebound and no guarding.  Genitourinary: There is no rash or tenderness on the right labia. There is no rash or tenderness on the left labia. Uterus is not enlarged and not tender. Cervix exhibits no motion tenderness, no discharge and no friability. Right adnexum displays no mass, no tenderness and no fullness. Left adnexum displays no mass, no tenderness and no fullness. No erythema or tenderness around the vagina. Vaginal discharge found.       Small amt mucoid pink/tan discharge in vagina  Musculoskeletal: Normal range of motion.  Neurological: She is alert and oriented to person, place, and time.  Skin: Skin is warm and dry.  Psychiatric: She has a normal mood and affect.    ED Course  Procedures (including critical  care time)   Labs Reviewed  POCT PREGNANCY, URINE  URINALYSIS, ROUTINE W REFLEX MICROSCOPIC  POCT URINE PREGNANCY   No results found. Results for orders placed during the hospital encounter of 11/07/11 (from the past 24 hour(s))  URINALYSIS, ROUTINE W REFLEX MICROSCOPIC     Status: Abnormal   Collection Time   11/07/11  9:25 AM      Component Value Range   Color, Urine YELLOW  YELLOW    Appearance CLEAR  CLEAR    Specific Gravity,  Urine >1.030 (*) 1.005 - 1.030    pH 6.0  5.0 - 8.0    Glucose, UA NEGATIVE  NEGATIVE (mg/dL)   Hgb urine dipstick TRACE (*) NEGATIVE    Bilirubin Urine NEGATIVE  NEGATIVE    Ketones, ur NEGATIVE  NEGATIVE (mg/dL)   Protein, ur NEGATIVE  NEGATIVE (mg/dL)   Urobilinogen, UA 0.2  0.0 - 1.0 (mg/dL)   Nitrite NEGATIVE  NEGATIVE    Leukocytes, UA NEGATIVE  NEGATIVE   URINE MICROSCOPIC-ADD ON     Status: Abnormal   Collection Time   11/07/11  9:25 AM      Component Value Range   Squamous Epithelial / LPF FEW (*) RARE    WBC, UA    <3 (WBC/hpf)   Value: NO FORMED ELEMENTS SEEN ON URINE MICROSCOPIC EXAMINATION   RBC / HPF 0-2  <3 (RBC/hpf)   Bacteria, UA RARE  RARE   POCT PREGNANCY, URINE     Status: Normal   Collection Time   11/07/11  9:28 AM      Component Value Range   Preg Test, Ur NEGATIVE    CBC     Status: Abnormal   Collection Time   11/07/11  9:52 AM      Component Value Range   WBC 5.9  4.0 - 10.5 (K/uL)   RBC 4.31  3.87 - 5.11 (MIL/uL)   Hemoglobin 11.5 (*) 12.0 - 15.0 (g/dL)   HCT 95.6 (*) 21.3 - 46.0 (%)   MCV 83.3  78.0 - 100.0 (fL)   MCH 26.7  26.0 - 34.0 (pg)   MCHC 32.0  30.0 - 36.0 (g/dL)   RDW 08.6  57.8 - 46.9 (%)   Platelets 110 (*) 150 - 400 (K/uL)  WET PREP, GENITAL     Status: Abnormal   Collection Time   11/07/11 10:25 AM      Component Value Range   Yeast, Wet Prep NONE SEEN  NONE SEEN    Trich, Wet Prep NONE SEEN  NONE SEEN    Clue Cells, Wet Prep NONE SEEN  NONE SEEN    WBC, Wet Prep HPF POC FEW (*) NONE SEEN       No diagnosis found.ASSESSMENT/PLAN 1-Bleeding due to Depo Provera last year. Pt is starting to have regular periods. Contraception discussed, pt will call [facility] for an IUD. 2-Abd pain probably GI in origin. Recent onset diarrhea/constipation. To call [facility] for an appt. 3-Back pain prob muscle strain- no exercise 4-Known ITP,platelets 110 today    MDM          Avon Gully. Salli Bodin 11/07/11 1122

## 2011-11-10 LAB — GC/CHLAMYDIA PROBE AMP, GENITAL
Chlamydia, DNA Probe: POSITIVE — AB
GC Probe Amp, Genital: NEGATIVE

## 2011-11-11 ENCOUNTER — Telehealth (HOSPITAL_COMMUNITY): Payer: Self-pay | Admitting: *Deleted

## 2011-11-11 NOTE — Telephone Encounter (Signed)
Telephone call to patient regarding positive chlamydia culture, patient notified.  Instructed patient to schedule treatment with [facility] at [phone].  Instructed patient to notify her partner for treatment.  Report faxed to health department.  

## 2012-03-17 ENCOUNTER — Telehealth: Payer: Self-pay | Admitting: *Deleted

## 2012-03-17 NOTE — Telephone Encounter (Signed)
Pt called and left message re:  Pt would like to ask Dr. Dalene Carrow a few questions.   Attempted to call pt back unsuccessfully  -  Phone  Not in service.   Pt left phone number   [phone]  -  Not working.

## 2012-05-02 ENCOUNTER — Inpatient Hospital Stay (HOSPITAL_COMMUNITY)
Admission: AD | Admit: 2012-05-02 | Discharge: 2012-05-02 | Discharge status: Against Medical Advice | Payer: Self-pay | Source: Ambulatory Visit | Attending: Obstetrics & Gynecology | Admitting: Obstetrics & Gynecology

## 2012-05-02 NOTE — MAU Note (Signed)
Patient is not in the lobby when called to triage.  

## 2012-05-02 NOTE — MAU Note (Signed)
Pt is not in the lobby when called to triage 

## 2012-08-22 ENCOUNTER — Emergency Department (HOSPITAL_COMMUNITY)
Admission: EM | Admit: 2012-08-22 | Discharge: 2012-08-22 | Disposition: A | Discharge status: Home / Self Care | Payer: Self-pay | Attending: Emergency Medicine | Admitting: Emergency Medicine

## 2012-08-22 ENCOUNTER — Encounter (HOSPITAL_COMMUNITY): Payer: Self-pay | Admitting: Emergency Medicine

## 2012-08-22 ENCOUNTER — Emergency Department (HOSPITAL_COMMUNITY): Payer: Self-pay

## 2012-08-22 DIAGNOSIS — K047 Periapical abscess without sinus: Secondary | ICD-10-CM | POA: Insufficient documentation

## 2012-08-22 DIAGNOSIS — R599 Enlarged lymph nodes, unspecified: Secondary | ICD-10-CM | POA: Insufficient documentation

## 2012-08-22 DIAGNOSIS — R220 Localized swelling, mass and lump, head: Secondary | ICD-10-CM | POA: Insufficient documentation

## 2012-08-22 LAB — CBC WITH DIFFERENTIAL/PLATELET
Basophils Absolute: 0 K/uL (ref 0.0–0.1)
Basophils Relative: 0 % (ref 0–1)
Eosinophils Absolute: 0.1 K/uL (ref 0.0–0.7)
Eosinophils Relative: 1 % (ref 0–5)
HCT: 35.5 % — ABNORMAL LOW (ref 36.0–46.0)
Hemoglobin: 12.2 g/dL (ref 12.0–15.0)
Lymphocytes Relative: 15 % (ref 12–46)
Lymphs Abs: 1.4 K/uL (ref 0.7–4.0)
MCH: 27.7 pg (ref 26.0–34.0)
MCHC: 34.4 g/dL (ref 30.0–36.0)
MCV: 80.7 fL (ref 78.0–100.0)
Monocytes Absolute: 0.5 K/uL (ref 0.1–1.0)
Monocytes Relative: 5 % (ref 3–12)
Neutro Abs: 7.6 K/uL (ref 1.7–7.7)
Neutrophils Relative %: 79 % — ABNORMAL HIGH (ref 43–77)
Platelets: 103 K/uL — ABNORMAL LOW (ref 150–400)
RBC: 4.4 MIL/uL (ref 3.87–5.11)
RDW: 14 % (ref 11.5–15.5)
WBC: 9.6 K/uL (ref 4.0–10.5)

## 2012-08-22 LAB — BASIC METABOLIC PANEL WITH GFR
BUN: 10 mg/dL (ref 6–23)
CO2: 24 meq/L (ref 19–32)
Calcium: 9.3 mg/dL (ref 8.4–10.5)
Chloride: 102 meq/L (ref 96–112)
Creatinine, Ser: 0.77 mg/dL (ref 0.50–1.10)
GFR calc Af Amer: >90 mL/min
GFR calc non Af Amer: >90 mL/min
Glucose, Bld: 107 mg/dL — ABNORMAL HIGH (ref 70–99)
Potassium: 3.4 meq/L — ABNORMAL LOW (ref 3.5–5.1)
Sodium: 135 meq/L (ref 135–145)

## 2012-08-22 IMAGING — CT CT MAXILLOFACIAL W/ CM
3 series · 16 of 47 positions shown, 19 images · IV contrast (omnipaque)
Comparison: CTs of the head and maxillofacial structures
[DATE].

CLINICAL DATA: Right jaw pain and swelling for 4 months.  No acute
injury, prior relevant surgery or fever.

CT MAXILLOFACIAL WITH CONTRAST
TECHNIQUE: Multidetector CT imaging of the maxillofacial
structures was performed with intravenous contrast. Multiplanar CT
image reconstructions were also generated.
Contrast: 100mL OMNIPAQUE IOHEXOL 300 MG/ML  SOLN

[Series 3: facial st · axial · 0.30mm/px · z∈[-766,-632]mm · 10 of 79 slices shown, 13 images]
[im 6/79  brain]
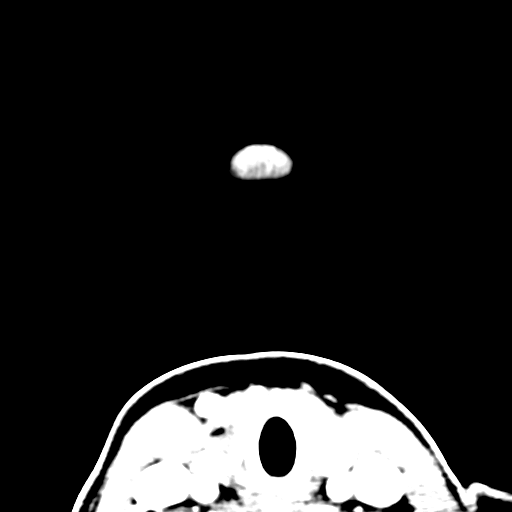
[im 6/79  bone]
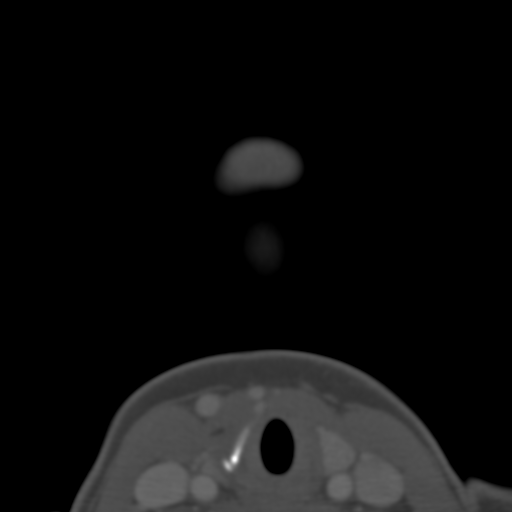
[im 14/79  bone]
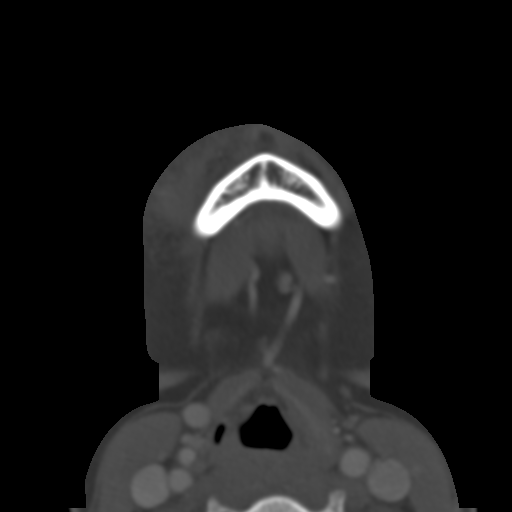
[im 22/79  bone]
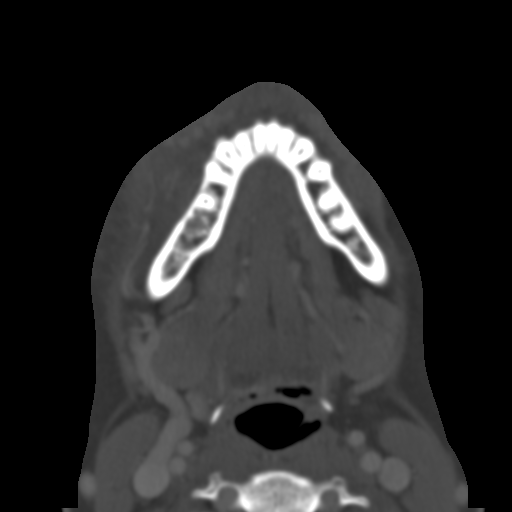
[im 27/79  bone]
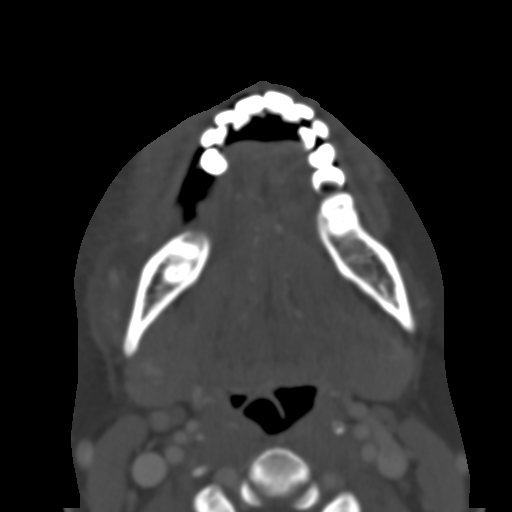
[im 35/79  brain]
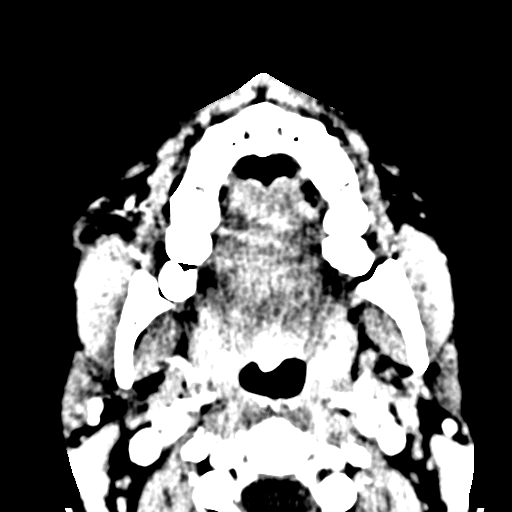
[im 35/79  bone]
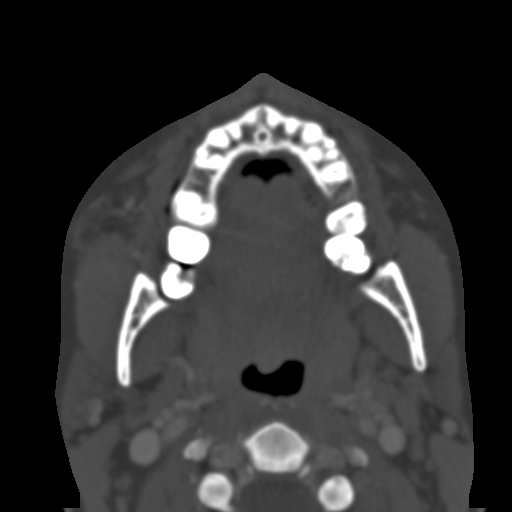
[im 44/79  bone]
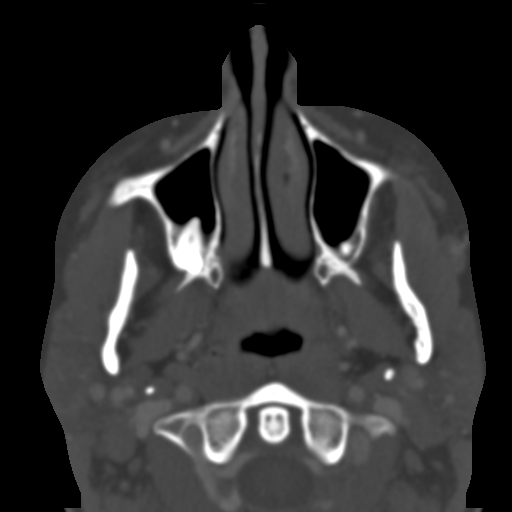
[im 52/79  bone]
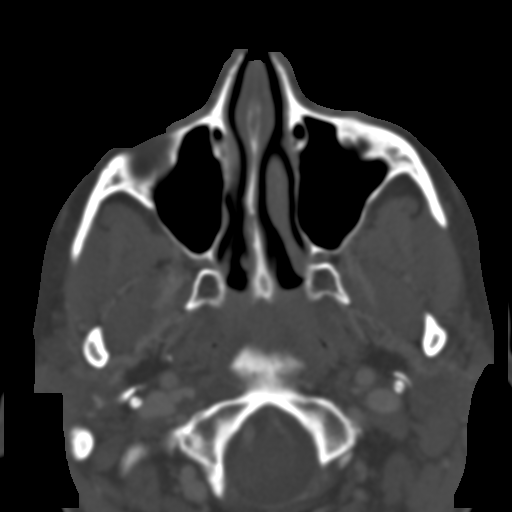
[im 60/79  bone]
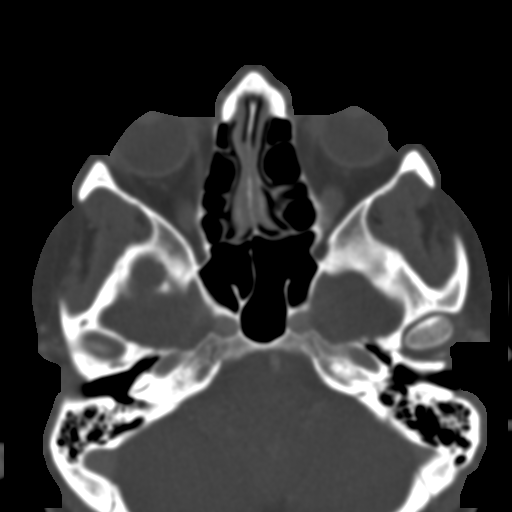
[im 65/79  brain]
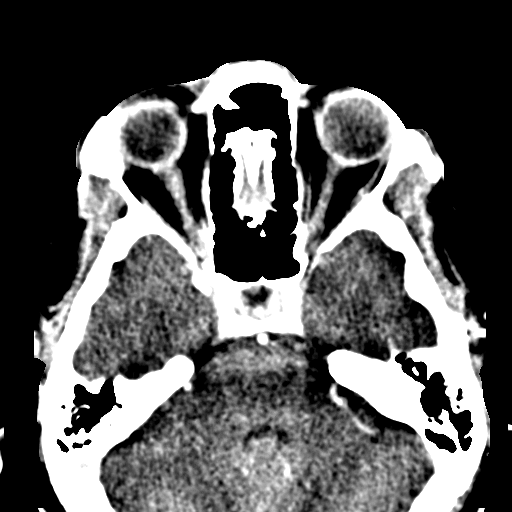
[im 65/79  bone]
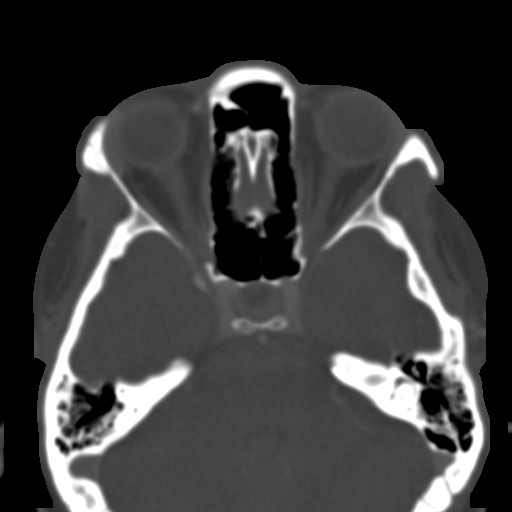
[im 73/79  bone]
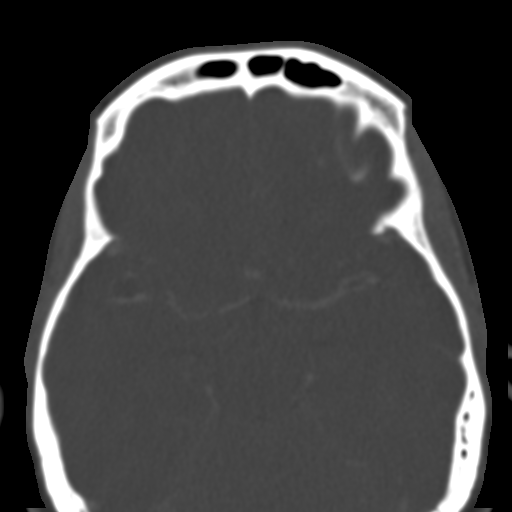

[Series 602: <mpr thick range> · coronal · 0.31mm/px · 3 of 77 slices shown]
[im 26/77  bone]
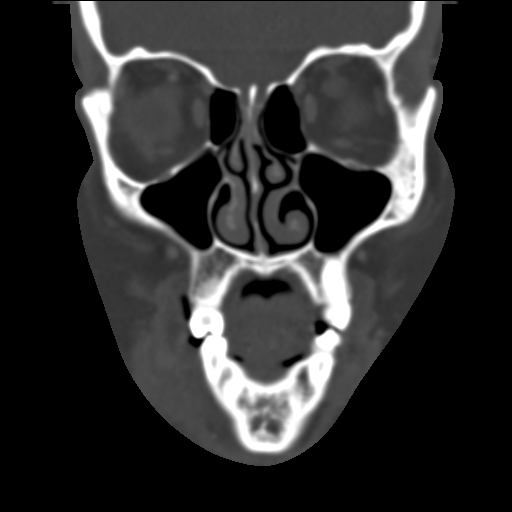
[im 34/77  bone]
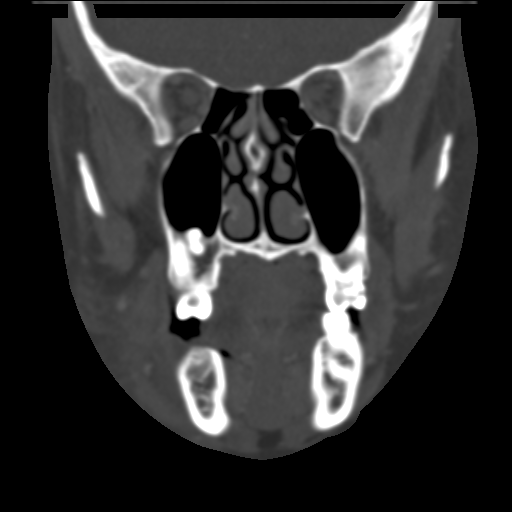
[im 43/77  bone]
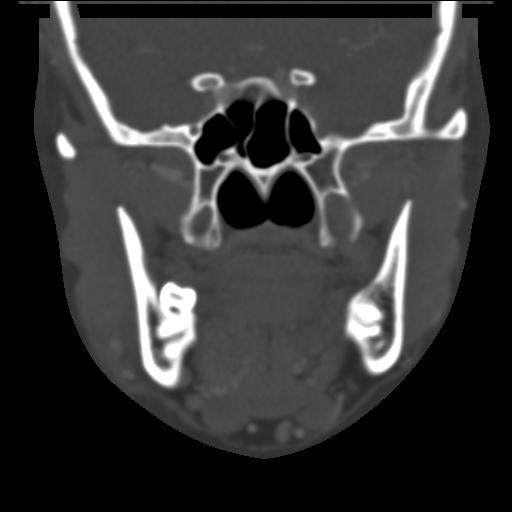

[Series 603: <mpr thick range(1)> · sagittal · 0.31mm/px · 3 of 72 slices shown]
[im 24/72  bone]
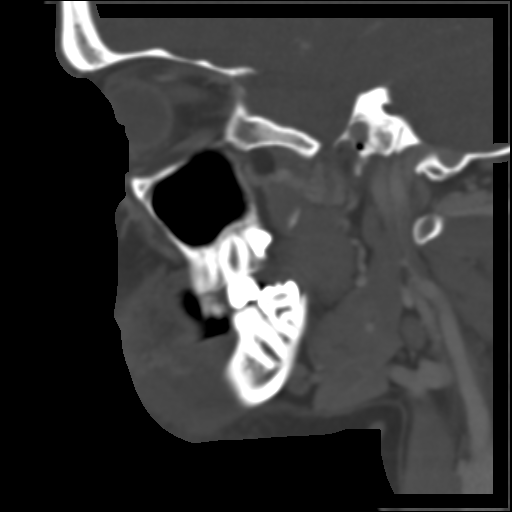
[im 36/72  bone]
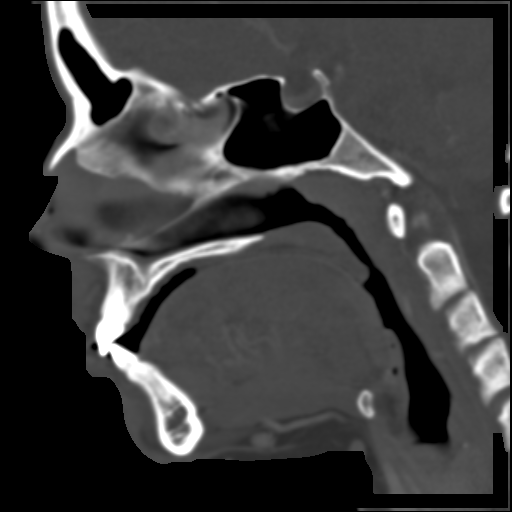
[im 48/72  bone]
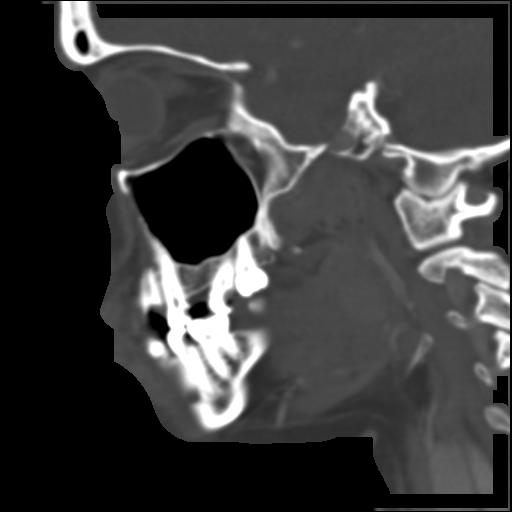

[16 of 47 positions shown; findings below may reference images not displayed]

FINDINGS: There is asymmetric soft tissue stranding in the
subcutaneous fat lateral to the right mandibular body and
symphysis.  There is adjacent thickening of the platysma muscle
with possible ill-defined fluid along the lateral cortex of the
mandible.  The submandibular and parotid glands appear normal.
There is no lymphadenopathy.

Multiple dental caries are noted.  There is mildly prominent
lucency surrounding the right mandibular second bicuspid (tooth
number 29).  No cortical destruction is evident.  There is no
evidence of acute fracture or dislocation.  The temporomandibular
joints appear unremarkable.

The paranasal sinuses, mastoid air cells and middle ears are clear.
No orbital abnormalities are identified.  Visualized intracranial
contents are unremarkable.
IMPRESSION: 1.  Findings are suggestive of right perimandibular cellulitis.
There is possible associated myositis and ill-defined fluid.  No
drainable fluid collection is identified.
2.  Periodontal disease is the suspected source of this
inflammatory change.  Namely, there is peri apical lucency
surrounding the right mandibular second bicuspid.  Multiple dental
caries are noted; correlate clinically.
3.  No evidence of orbital cellulitis or sinusitis.

## 2012-08-22 MED ORDER — PIPERACILLIN-TAZOBACTAM 3.375 G IVPB
3.3750 g | Freq: Once | INTRAVENOUS | Status: AC
  Administered 2012-08-22: 3.375 g via INTRAVENOUS
  Filled 2012-08-22: qty 50

## 2012-08-22 MED ORDER — BENZOCAINE 20 % MT PSTE: PASTE | Freq: Once | OROMUCOSAL | Status: DC

## 2012-08-22 MED ORDER — ONDANSETRON HCL 4 MG/2ML IJ SOLN
4.0000 mg | Freq: Once | INTRAMUSCULAR | Status: AC
  Administered 2012-08-22: 4 mg via INTRAVENOUS
  Filled 2012-08-22: qty 2

## 2012-08-22 MED ORDER — IOHEXOL 300 MG/ML  SOLN
100.0000 mL | Freq: Once | INTRAMUSCULAR | Status: AC | PRN
  Administered 2012-08-22: 100 mL via INTRAVENOUS

## 2012-08-22 MED ORDER — BENZOCAINE 20 % MT PSTE
PASTE | Freq: Four times a day (QID) | OROMUCOSAL | Status: DC | PRN
  Filled 2012-08-22: qty 11.9

## 2012-08-22 MED ORDER — CLINDAMYCIN HCL 150 MG PO CAPS: 300.0000 mg | ORAL_CAPSULE | Freq: Two times a day (BID) | ORAL | Status: DC

## 2012-08-22 MED ORDER — OXYCODONE-ACETAMINOPHEN 5-325 MG PO TABS
ORAL_TABLET | ORAL | Status: AC
Start: 2012-08-22 — End: 2012-09-01

## 2012-08-22 MED ORDER — PENICILLIN V POTASSIUM 500 MG PO TABS: 500.0000 mg | ORAL_TABLET | Freq: Four times a day (QID) | ORAL | Status: AC

## 2012-08-22 MED ORDER — OXYCODONE-ACETAMINOPHEN 5-325 MG PO TABS: ORAL_TABLET | ORAL | Status: DC

## 2012-08-22 MED ORDER — BENZOCAINE 10 % MT GEL
Freq: Four times a day (QID) | OROMUCOSAL | Status: DC | PRN
  Filled 2012-08-22: qty 9.4

## 2012-08-22 MED ORDER — OXYCODONE-ACETAMINOPHEN 5-325 MG PO TABS
2.0000 | ORAL_TABLET | Freq: Once | ORAL | Status: AC
  Administered 2012-08-22: 2 via ORAL
  Filled 2012-08-22: qty 2

## 2012-08-22 MED ORDER — CLINDAMYCIN HCL 300 MG PO CAPS
300.0000 mg | ORAL_CAPSULE | Freq: Once | ORAL | Status: AC
  Administered 2012-08-22: 300 mg via ORAL
  Filled 2012-08-22: qty 1

## 2012-08-22 MED ORDER — HYDROMORPHONE HCL PF 1 MG/ML IJ SOLN
1.0000 mg | Freq: Once | INTRAMUSCULAR | Status: AC
  Administered 2012-08-22: 1 mg via INTRAVENOUS
  Filled 2012-08-22: qty 1

## 2012-08-22 NOTE — ED Provider Notes (Signed)
Medical screening examination/treatment/procedure(s) were conducted as a shared visit with non-physician practitioner(s) and myself.  I personally evaluated the patient during the encounter  Pt hs significant trismus and right mandibular swelling without evidence of tongue elevation or airway compromise.  Note multiple missing and decayed teeth.  Unable to palpate and specific area of fluctuance.  Plan CT scan to look for an abscess or extension of the dental infection.  Will initiate antibiotics and pain medication also.  Tobin Chad, MD 08/22/12 1118

## 2012-08-22 NOTE — ED Notes (Signed)
Pt reports dental pain x 4 months. Says " I need to have my back teeth on both sides pulled". Pts jaw is swollen and has pain bilaterally from ear to ear along her jaw.

## 2012-08-22 NOTE — ED Notes (Signed)
Returned from CT.

## 2012-08-22 NOTE — ED Provider Notes (Signed)
History     CSN: [id number]  Arrival date & time 08/22/12  6213   First MD Initiated Contact with Patient 08/22/12 1022      Chief Complaint  Patient presents with  . Dental Pain  . Facial Swelling    (Consider location/radiation/quality/duration/timing/severity/associated sxs/prior treatment) HPI  26 y.o. female appears acutely uncomfortable complaining of worsening dental pain over the course of 4 months. Patient presents today for swelling to the right cheek worsen worsening over the course of 24 hours. Patient denies any fever, change in vision. Pain is 10 out of 10, non radiating,not alleviated by over-the-counter pain medications exacerbated by chewing or palpation..   Past Medical History  Diagnosis Date  . ITP (idiopathic thrombocytopenic purpura)     Past Surgical History  Procedure Date  . Surgical repair to wrist     Family History  Problem Relation Age of Onset  . Cancer Father     leukemia    History  Substance Use Topics  . Smoking status: Current Some Day Smoker  . Smokeless tobacco: Not on file  . Alcohol Use: No    OB History    Grav Para Term Preterm Abortions TAB SAB Ect Mult Living   8 6 6  0 2 1 1   6       Review of Systems  Constitutional: Negative for fever.  HENT: Negative for drooling.   All other systems reviewed and are negative.    Allergies  Review of patient's allergies indicates no known allergies.  Home Medications   Current Outpatient Rx  Name Route Sig Dispense Refill  . BC HEADACHE POWDER PO Oral Take 1 packet by mouth daily as needed. For headaches     . OVER THE COUNTER MEDICATION Left Ear Place 2 drops into the left ear as needed. Ear Pain Relief OTC    . PREDNISONE 10 MG PO TABS Oral Take 40 mg by mouth daily.      BP 120/85  Pulse 105  Temp 99.9 F (37.7 C)  Resp 18  SpO2 100%  LMP 08/19/2012  Physical Exam  Nursing note and vitals reviewed. Constitutional: She is oriented to person, place, and time.  She appears well-developed and well-nourished. No distress.  HENT:  Head: Normocephalic and atraumatic.       Right lower cheek significantly swollen. No evidence of induration or central fluctuance appreciated. Multiple dental caries  Eyes: Conjunctivae and EOM are normal. Pupils are equal, round, and reactive to light.  Neck: Normal range of motion.  Cardiovascular: Normal rate and regular rhythm.   Pulmonary/Chest: Effort normal and breath sounds normal.  Abdominal: Soft.  Musculoskeletal: Normal range of motion.  Lymphadenopathy:    She has cervical adenopathy.  Neurological: She is alert and oriented to person, place, and time.  Skin: Skin is warm.  Psychiatric: She has a normal mood and affect.    ED Course  Procedures (including critical care time)  Labs Reviewed  CBC WITH DIFFERENTIAL - Abnormal; Notable for the following:    HCT 35.5 (*)     Platelets 103 (*)     Neutrophils Relative 79 (*)     All other components within normal limits  BASIC METABOLIC PANEL - Abnormal; Notable for the following:    Potassium 3.4 (*)     Glucose, Bld 107 (*)     All other components within normal limits   Ct Maxillofacial W/cm  08/22/2012  *RADIOLOGY REPORT*  Clinical Data: Right jaw pain and swelling  for 4 months.  No acute injury, prior relevant surgery or fever.  CT MAXILLOFACIAL WITH CONTRAST  Technique:  Multidetector CT imaging of the maxillofacial structures was performed with intravenous contrast. Multiplanar CT image reconstructions were also generated.  Contrast: OMNIPAQUE IOHEXOL 300 MG/ML  SOLN  Comparison: CTs of the head and maxillofacial structures 06/11/2011.  Findings: There is asymmetric soft tissue stranding in the subcutaneous fat lateral to the right mandibular body and symphysis.  There is adjacent thickening of the platysma muscle with possible ill-defined fluid along the lateral cortex of the mandible.  The submandibular and parotid glands appear normal. There is  no lymphadenopathy.  Multiple dental caries are noted.  There is mildly prominent lucency surrounding the right mandibular second bicuspid (tooth number 29).  No cortical destruction is evident.  There is no evidence of acute fracture or dislocation.  The temporomandibular joints appear unremarkable.  The paranasal sinuses, mastoid air cells and middle ears are clear. No orbital abnormalities are identified.  Visualized intracranial contents are unremarkable.  IMPRESSION:  1.  Findings are suggestive of right perimandibular cellulitis. There is possible associated myositis and ill-defined fluid.  No drainable fluid collection is identified. 2.  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  Namely, there is peri apical lucency surrounding the right mandibular second bicuspid.  Multiple dental caries are noted; correlate clinically. 3.  No evidence of orbital cellulitis or sinusitis.   Original Report Authenticated By: Gerrianne Scale, M.D.      1. Dental abscess       MDM  26 y.o. female presenting with severe dental pain and swelling to right jaw. CT shows right perimandibular cellulitis and a possible associated myositis with no drainable fluid collection. There is a periapical lumen lucency to the right lower second bicuspid that is a likely source of the infection.  Patient has no white count, is afebrile and has no change in her vision. I will start her on clindamycin control pain with Percocet and encourage her to followup with the adult dentist in the next 24-48 hours for a wound check. Return precautions given.         [name], PA-C 08/25/12 [phone]

## 2012-08-26 NOTE — ED Provider Notes (Signed)
Medical screening examination/treatment/procedure(s) were performed by non-physician practitioner and as supervising physician I was immediately available for consultation/collaboration.   Suzi Roots, MD 08/26/12 1435

## 2012-08-26 NOTE — ED Provider Notes (Signed)
Patient seen in fast track for complaint of dental pain.  She has significant facial swelling on right plower jaw. Patient will need IV access and CT scan of the face.  She is thereor not fast track appropriate.  I have seen the patient with Dr. Lorenso Courier who agrees.  Discussed with [name], PA-C who has agreed to care for the patient on the acute side. Arthor Captain   [place], [place] 08/26/12 [phone]

## 2012-08-26 NOTE — ED Provider Notes (Signed)
Medical screening examination/treatment/procedure(s) were performed by non-physician practitioner and as supervising physician I was immediately available for consultation/collaboration.  Tobin Chad, MD 08/26/12 [phone]

## 2012-11-05 ENCOUNTER — Emergency Department (HOSPITAL_COMMUNITY)
Admission: EM | Admit: 2012-11-05 | Discharge: 2012-11-06 | Disposition: A | Discharge status: Home / Self Care | Payer: Self-pay | Attending: Emergency Medicine | Admitting: Emergency Medicine

## 2012-11-05 ENCOUNTER — Encounter (HOSPITAL_COMMUNITY): Payer: Self-pay

## 2012-11-05 DIAGNOSIS — D693 Immune thrombocytopenic purpura: Secondary | ICD-10-CM | POA: Insufficient documentation

## 2012-11-05 DIAGNOSIS — N83209 Unspecified ovarian cyst, unspecified side: Secondary | ICD-10-CM | POA: Insufficient documentation

## 2012-11-05 DIAGNOSIS — F17200 Nicotine dependence, unspecified, uncomplicated: Secondary | ICD-10-CM | POA: Insufficient documentation

## 2012-11-05 DIAGNOSIS — N83201 Unspecified ovarian cyst, right side: Secondary | ICD-10-CM

## 2012-11-05 LAB — URINE MICROSCOPIC-ADD ON

## 2012-11-05 LAB — URINALYSIS, ROUTINE W REFLEX MICROSCOPIC
Bilirubin Urine: NEGATIVE
Ketones, ur: NEGATIVE mg/dL
Nitrite: POSITIVE — AB
Specific Gravity, Urine: 1.026 (ref 1.005–1.030)
Urobilinogen, UA: 1 mg/dL (ref 0.0–1.0)

## 2012-11-05 MED ORDER — OXYCODONE-ACETAMINOPHEN 5-325 MG PO TABS
2.0000 | ORAL_TABLET | Freq: Once | ORAL | Status: AC
  Administered 2012-11-06: 2 via ORAL
  Filled 2012-11-05: qty 2

## 2012-11-05 MED ORDER — IBUPROFEN 800 MG PO TABS
800.0000 mg | ORAL_TABLET | Freq: Once | ORAL | Status: AC
  Administered 2012-11-06: 800 mg via ORAL
  Filled 2012-11-05: qty 1

## 2012-11-05 NOTE — ED Notes (Signed)
Per pt, c/o abdominal pain and vaginal pressure  3 times in last week.  Nausea no vomiting.  No fever.  Last bm yesterday.  No bleeding noted.

## 2012-11-06 ENCOUNTER — Emergency Department (HOSPITAL_COMMUNITY): Payer: Self-pay

## 2012-11-06 LAB — CBC WITH DIFFERENTIAL/PLATELET
Basophils Absolute: 0.1 10*3/uL (ref 0.0–0.1)
Eosinophils Absolute: 0.1 10*3/uL (ref 0.0–0.7)
HCT: 33.2 % — ABNORMAL LOW (ref 36.0–46.0)
Lymphocytes Relative: 25 % (ref 12–46)
MCHC: 34 g/dL (ref 30.0–36.0)
Neutro Abs: 4.7 10*3/uL (ref 1.7–7.7)
Platelets: 99 10*3/uL — ABNORMAL LOW (ref 150–400)
RDW: 14.3 % (ref 11.5–15.5)

## 2012-11-06 LAB — BASIC METABOLIC PANEL
Calcium: 8.8 mg/dL (ref 8.4–10.5)
Chloride: 104 mEq/L (ref 96–112)
Creatinine, Ser: 0.79 mg/dL (ref 0.50–1.10)
GFR calc Af Amer: >90 mL/min (ref >90)
Sodium: 136 mEq/L (ref 135–145)

## 2012-11-06 LAB — SAMPLE TO BLOOD BANK

## 2012-11-06 LAB — HCG, QUANTITATIVE, PREGNANCY: hCG, Beta Chain, Quant, S: <1 m[IU]/mL (ref <5)

## 2012-11-06 IMAGING — US US PELVIS COMPLETE
1 series · 13 of 25 positions shown · non-contrast
Comparison: Pelvic ultrasound performed [DATE]

CLINICAL DATA: Pelvic pain and pressure.

TRANSABDOMINAL AND TRANSVAGINAL ULTRASOUND OF PELVIS
DOPPLER ULTRASOUND OF OVARIES
TECHNIQUE: Both transabdominal and transvaginal ultrasound
examinations of the pelvis were performed. Transabdominal technique
was performed for global imaging of the pelvis including uterus,
ovaries, adnexal regions, and pelvic cul-de-sac.
It was necessary to proceed with endovaginal exam following the
transabdominal exam to visualize the uterus and ovaries in greater
detail.
Color and duplex Doppler ultrasound was utilized to evaluate blood
flow to the ovaries.

[Series 1: us pelvis complete · 0.30mm/px · 13 of 149 slices shown]
[im 1/149]
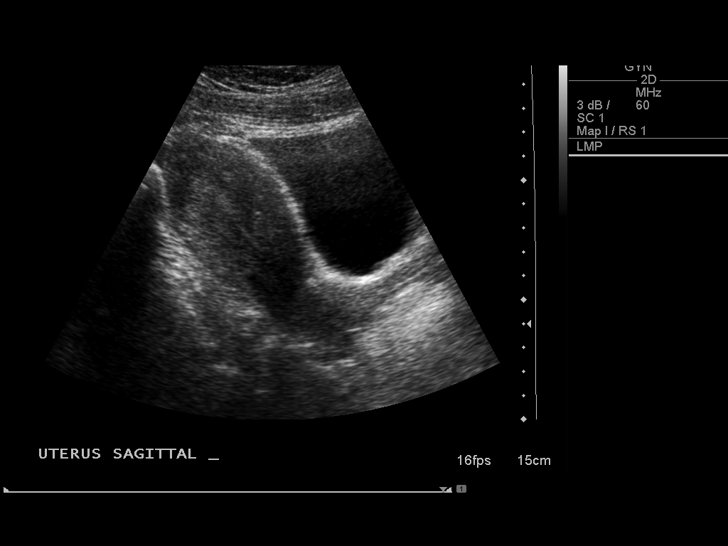
[im 13/149]
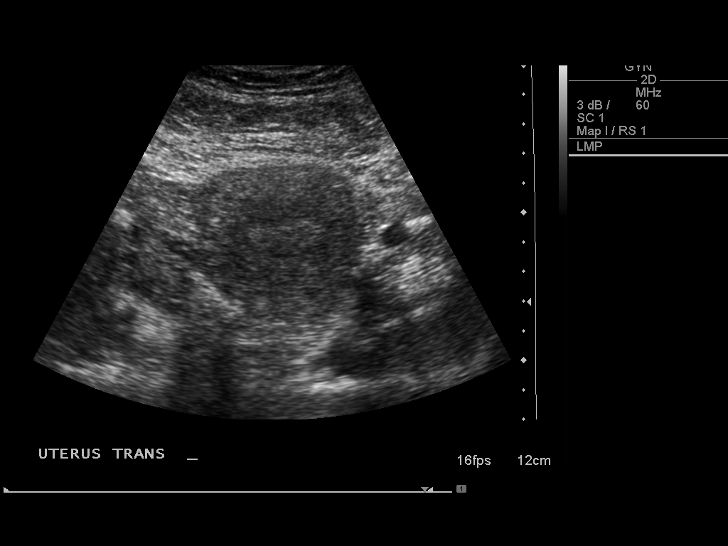
[im 25/149]
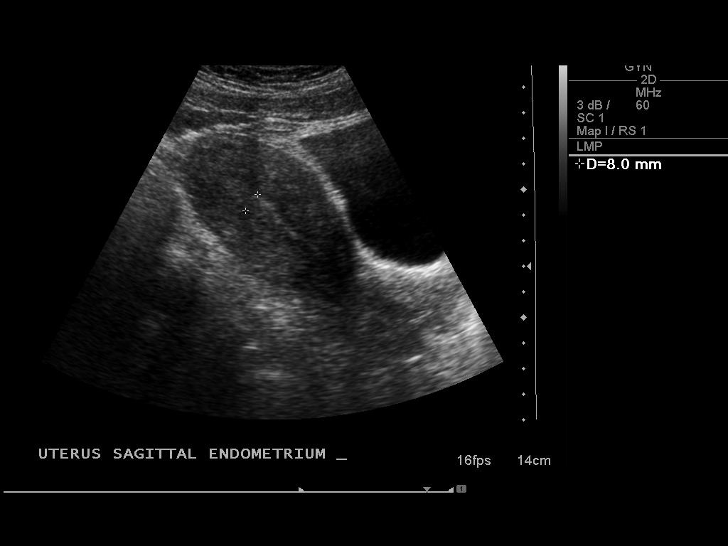
[im 38/149]
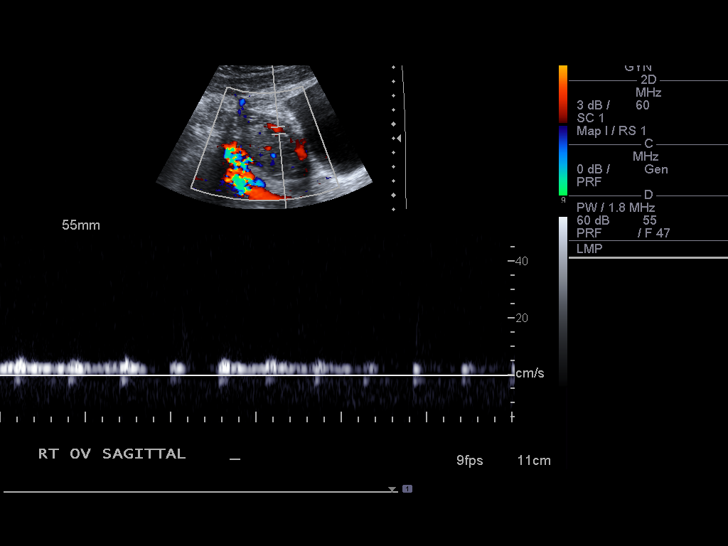
[im 50/149]
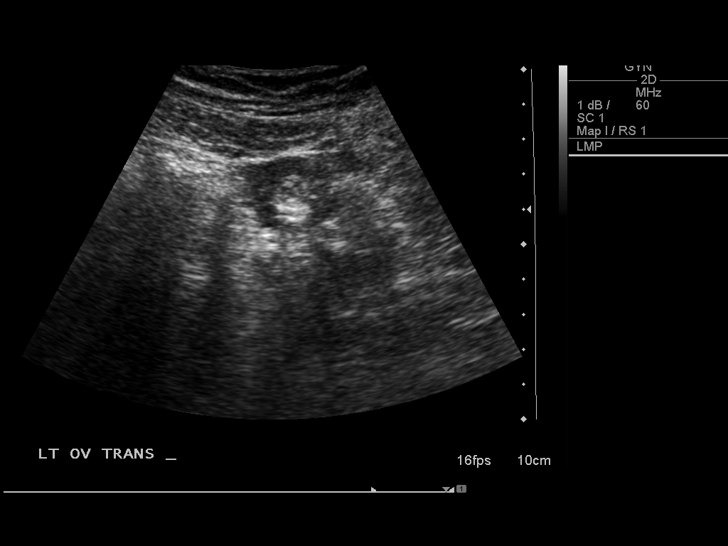
[im 62/149]
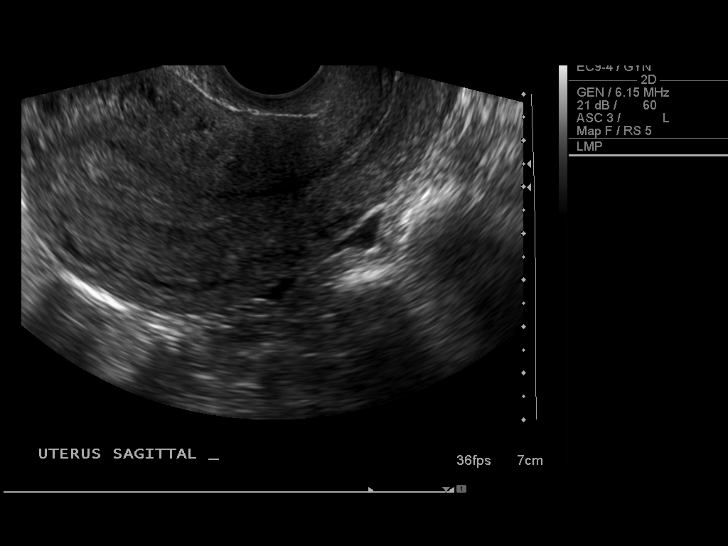
[im 75/149]
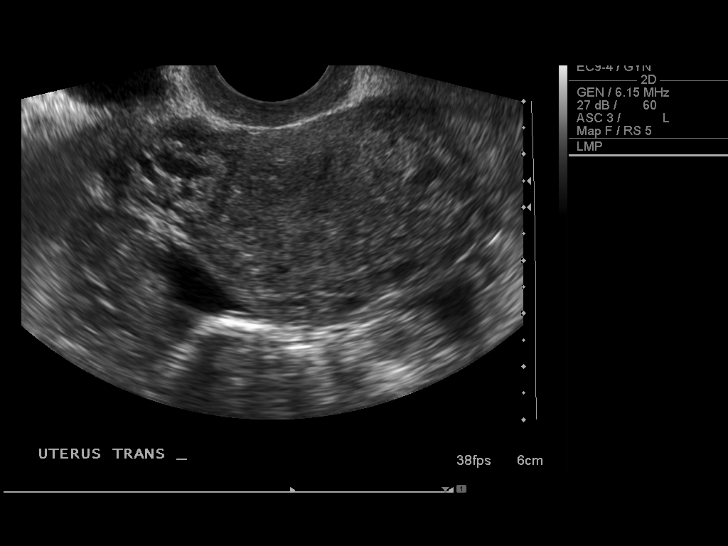
[im 87/149]
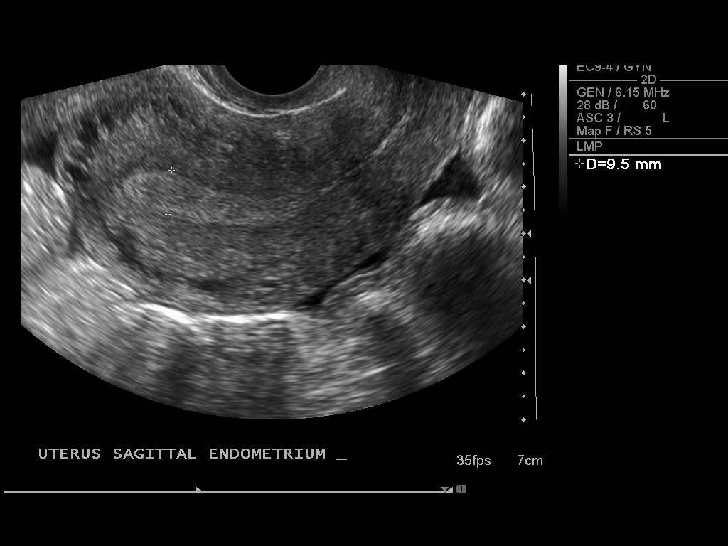
[im 99/149]
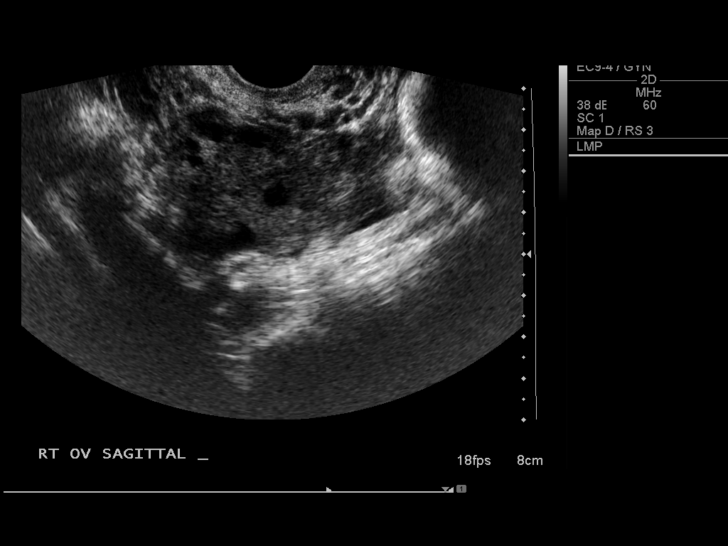
[im 112/149]
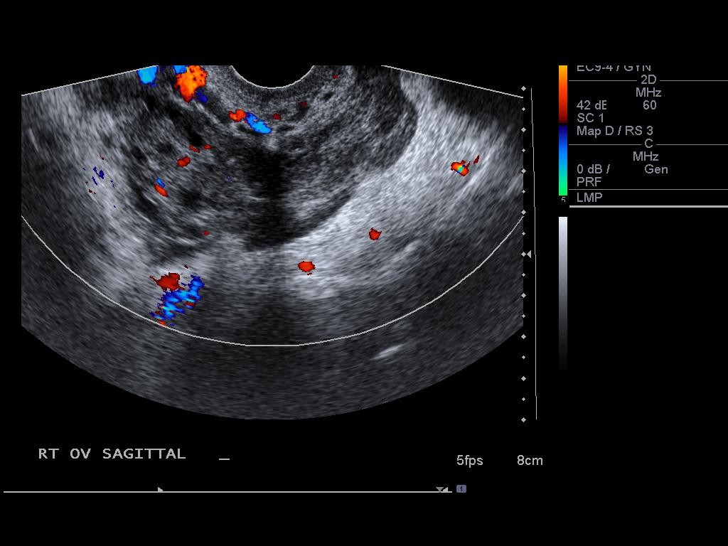
[im 124/149]
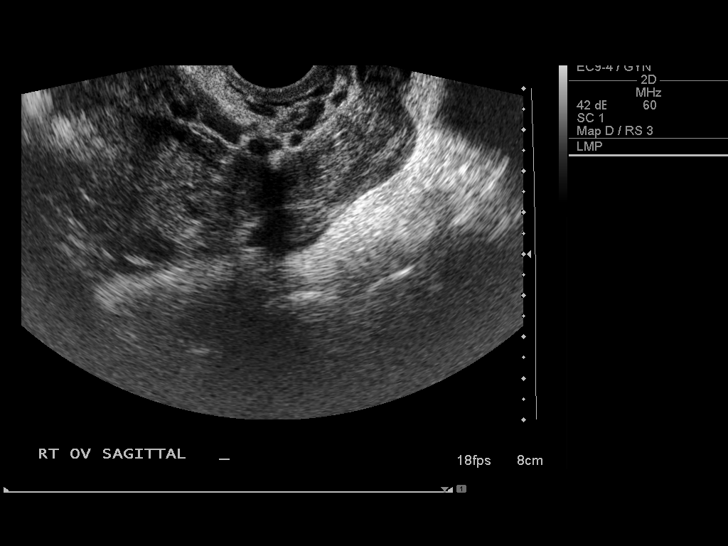
[im 136/149]
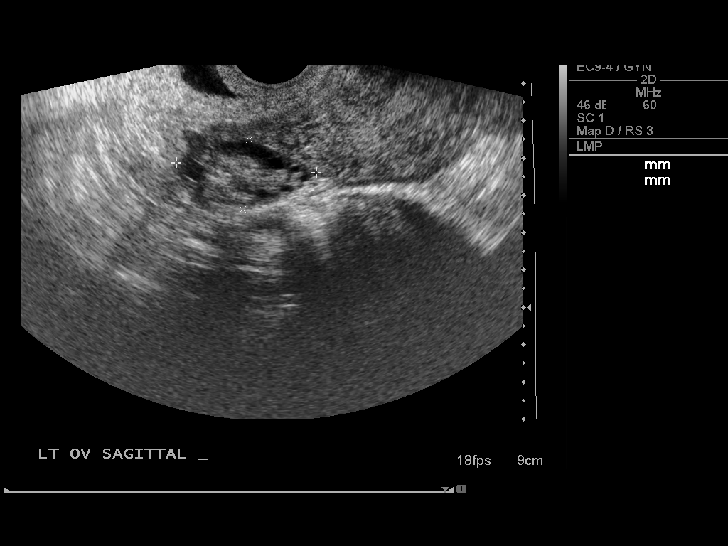
[im 149/149]
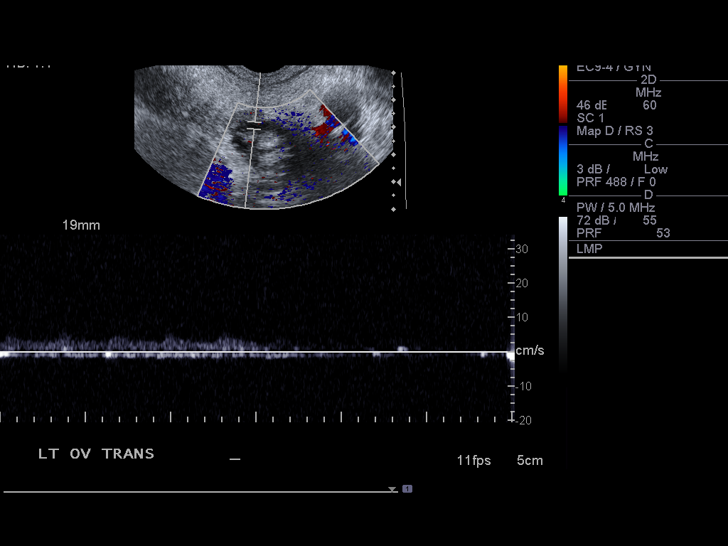

[13 of 25 positions shown; findings below may reference images not displayed]

FINDINGS: Uterus:  Normal in size and appearance; measures 11.6 x 5.3 x
cm.  A few Nabothian cysts are noted at the cervix.

Endometrium:  Normal in thickness and appearance; measures 1.0 cm
in thickness.

Right ovary: There is a large complex heterogeneous region of soft
tissue noted at the right adnexa, measuring 9.5 x 3.9 x 3.0 cm.
This is thought to reflect right ovary and adjacent clot; a 2.2 cm
cystic focus with a significantly thickened wall may reflect a
prominent follicle or a blighted ectopic pregnancy, given the
patient's negative beta-HCG.

Left ovary:   Normal appearance/no adnexal mass; measures 3.8 x
x 2.9 cm.

Pulsed Doppler evaluation demonstrates normal low-resistance
arterial and venous waveforms in both ovaries.

A small amount of free fluid is noted within the pelvis and about
the right adnexa.
IMPRESSION: 1.  Large complex heterogeneous region of soft tissue at the right
adnexa, measuring 9.5 x 3.9 x 3.2 cm.  This is thought to reflect
right ovary and adjacent blood clot; a 2.2 cm cystic focus with a
significantly thickened wall may reflect a prominent follicle or a
blighted ectopic pregnancy, given the patient's negative beta-HCG.
Surrounding free fluid noted.  Given lack of associated blood flow
and fitting of the region to surrounding soft tissues, a mass is
considered unlikely.

Would consider a repeat pregnancy test, to ensure that this does
not reflect a living ectopic pregnancy.

2.  No evidence for ovarian torsion.

The patient demonstrates severe tenderness on both transabdominal
and transvaginal exam.

to Dr. ROKO, who verbally acknowledged these results.

## 2012-11-06 MED ORDER — MORPHINE SULFATE 4 MG/ML IJ SOLN
4.0000 mg | Freq: Once | INTRAMUSCULAR | Status: AC
  Administered 2012-11-06: 4 mg via INTRAVENOUS
  Filled 2012-11-06: qty 1

## 2012-11-06 MED ORDER — OXYCODONE-ACETAMINOPHEN 5-325 MG PO TABS: 2.0000 | ORAL_TABLET | Freq: Four times a day (QID) | ORAL | Status: DC | PRN

## 2012-11-06 MED ORDER — IBUPROFEN 800 MG PO TABS: 800.0000 mg | ORAL_TABLET | Freq: Three times a day (TID) | ORAL | Status: DC | PRN

## 2012-11-06 NOTE — ED Notes (Signed)
Patient transported to Ultrasound 

## 2012-11-06 NOTE — ED Provider Notes (Signed)
History     CSN: [id number]  Arrival date & time 11/05/12  2252   First MD Initiated Contact with Patient 11/05/12 2319      Chief Complaint  Patient presents with  . Abdominal Pain    (Consider location/radiation/quality/duration/timing/severity/associated sxs/prior treatment) HPI 26 year old female presents to the emergency department with complaint of lower pelvic pain, and abdominal distention. Patient reports she's had 3 episodes of this pain, tonight being the worst. Each episode of pain has been associated with intercourse, usually with her on top. Patient denies any dysuria, no vaginal discharge. She reports remote history of ovarian cyst. Her last menstrual cycle was 2 weeks ago, and was normal. Patient feels pressure and pain throughout her lower abdomen and pelvis extending into her rectum and vagina. She's had no fever or chills.  Past Medical History  Diagnosis Date  . ITP (idiopathic thrombocytopenic purpura)     Past Surgical History  Procedure Date  . Surgical repair to wrist     Family History  Problem Relation Age of Onset  . Cancer Father     leukemia    History  Substance Use Topics  . Smoking status: Current Some Day Smoker  . Smokeless tobacco: Not on file  . Alcohol Use: No    OB History    Grav Para Term Preterm Abortions TAB SAB Ect Mult Living   8 6 6  0 2 1 1   6       Review of Systems  All other systems reviewed and are negative.    Allergies  Review of patient's allergies indicates no known allergies.  Home Medications  No current outpatient prescriptions on file.  BP 98/52  Pulse 69  Temp 98.9 F (37.2 C) (Oral)  Resp 17  SpO2 100%  LMP 10/10/2012  Physical Exam  Nursing note and vitals reviewed. Constitutional: She is oriented to person, place, and time. She appears well-developed and well-nourished.  HENT:  Head: Normocephalic and atraumatic.  Right Ear: External ear normal.  Left Ear: External ear normal.  Nose:  Nose normal.  Mouth/Throat: Oropharynx is clear and moist.  Eyes: Conjunctivae normal and EOM are normal. Pupils are equal, round, and reactive to light.  Neck: Normal range of motion. Neck supple. No JVD present. No tracheal deviation present. No thyromegaly present.  Cardiovascular: Normal rate, regular rhythm, normal heart sounds and intact distal pulses.  Exam reveals no gallop and no friction rub.   No murmur heard. Pulmonary/Chest: Effort normal and breath sounds normal. No stridor. No respiratory distress. She has no wheezes. She has no rales. She exhibits no tenderness.  Abdominal: Soft. Bowel sounds are normal. She exhibits no distension and no mass. There is tenderness (tenderness throughout lower abdomen without rebound or guarding). There is no rebound and no guarding.  Genitourinary: Vagina normal. No vaginal discharge found.       Normal external genitalia. No cervical motion tenderness, no abnormalities noted in the vagina. Patient with significant tenderness with bimanual examination, mainly in left adnexa and with uterine palpation. No masses noted in adnexa, however difficult exam secondary to body habitus and pain.  Musculoskeletal: Normal range of motion. She exhibits no edema and no tenderness.  Lymphadenopathy:    She has no cervical adenopathy.  Neurological: She is oriented to person, place, and time. She has normal reflexes. No cranial nerve deficit. She exhibits normal muscle tone. Coordination normal.  Skin: Skin is dry. No rash noted. No erythema. No pallor.  Psychiatric: She has a  normal mood and affect. Her behavior is normal. Judgment and thought content normal.    ED Course  Procedures (including critical care time)  Labs Reviewed  URINALYSIS, ROUTINE W REFLEX MICROSCOPIC - Abnormal; Notable for the following:    APPearance CLOUDY (*)     Nitrite POSITIVE (*)     Leukocytes, UA SMALL (*)     All other components within normal limits  WET PREP, GENITAL -  Abnormal; Notable for the following:    WBC, Wet Prep HPF POC RARE (*)     All other components within normal limits  URINE MICROSCOPIC-ADD ON - Abnormal; Notable for the following:    Squamous Epithelial / LPF FEW (*)     Bacteria, UA FEW (*)     All other components within normal limits  PREGNANCY, URINE  HCG, QUANTITATIVE, PREGNANCY  GC/CHLAMYDIA PROBE AMP  CBC WITH DIFFERENTIAL  BASIC METABOLIC PANEL   US Transvaginal Non-ob  11/06/2012  *RADIOLOGY REPORT*  Clinical Data:  Pelvic pain and pressure.  TRANSABDOMINAL AND TRANSVAGINAL ULTRASOUND OF PELVIS DOPPLER ULTRASOUND OF OVARIES  Technique:  Both transabdominal and transvaginal ultrasound examinations of the pelvis were performed. Transabdominal technique was performed for global imaging of the pelvis including uterus, ovaries, adnexal regions, and pelvic cul-de-sac.  It was necessary to proceed with endovaginal exam following the transabdominal exam to visualize the uterus and ovaries in greater detail.  Color and duplex Doppler ultrasound was utilized to evaluate blood flow to the ovaries.  Comparison:  Pelvic ultrasound performed 08/21/2010  Findings:  Uterus:  Normal in size and appearance; measures 11.6 x 5.3 x 5.7 cm.  A few Nabothian cysts are noted at the cervix.  Endometrium:  Normal in thickness and appearance; measures 1.0 cm in thickness.  Right ovary: There is a large complex heterogeneous region of soft tissue noted at the right adnexa, measuring 9.5 x 3.9 x 3.0 cm. This is thought to reflect right ovary and adjacent clot; a 2.2 cm cystic focus with a significantly thickened wall may reflect a prominent follicle or a blighted ectopic pregnancy, given the patient's negative beta-HCG.  Left ovary:   Normal appearance/no adnexal mass; measures 3.8 x 1.9 x 2.9 cm.  Pulsed Doppler evaluation demonstrates normal low-resistance arterial and venous waveforms in both ovaries.  A small amount of free fluid is noted within the pelvis and  about the right adnexa.  IMPRESSION:  1.  Large complex heterogeneous region of soft tissue at the right adnexa, measuring 9.5 x 3.9 x 3.2 cm.  This is thought to reflect right ovary and adjacent blood clot; a 2.2 cm cystic focus with a significantly thickened wall may reflect a prominent follicle or a blighted ectopic pregnancy, given the patient's negative beta-HCG. Surrounding free fluid noted.  Given lack of associated blood flow and fitting of the region to surrounding soft tissues, a mass is considered unlikely.  Would consider a repeat pregnancy test, to ensure that this does not reflect a living ectopic pregnancy.  2.  No evidence for ovarian torsion.  The patient demonstrates severe tenderness on both transabdominal and transvaginal exam.  These results were called by telephone on 11/06/2012 at 02:54 a.m. to Dr. Marisa Severin, who verbally acknowledged these results.   Original Report Authenticated By: Tonia Ghent, M.D.    US Pelvis Complete  11/06/2012  *RADIOLOGY REPORT*  Clinical Data:  Pelvic pain and pressure.  TRANSABDOMINAL AND TRANSVAGINAL ULTRASOUND OF PELVIS DOPPLER ULTRASOUND OF OVARIES  Technique:  Both transabdominal and transvaginal  ultrasound examinations of the pelvis were performed. Transabdominal technique was performed for global imaging of the pelvis including uterus, ovaries, adnexal regions, and pelvic cul-de-sac.  It was necessary to proceed with endovaginal exam following the transabdominal exam to visualize the uterus and ovaries in greater detail.  Color and duplex Doppler ultrasound was utilized to evaluate blood flow to the ovaries.  Comparison:  Pelvic ultrasound performed 08/21/2010  Findings:  Uterus:  Normal in size and appearance; measures 11.6 x 5.3 x 5.7 cm.  A few Nabothian cysts are noted at the cervix.  Endometrium:  Normal in thickness and appearance; measures 1.0 cm in thickness.  Right ovary: There is a large complex heterogeneous region of soft tissue noted at  the right adnexa, measuring 9.5 x 3.9 x 3.0 cm. This is thought to reflect right ovary and adjacent clot; a 2.2 cm cystic focus with a significantly thickened wall may reflect a prominent follicle or a blighted ectopic pregnancy, given the patient's negative beta-HCG.  Left ovary:   Normal appearance/no adnexal mass; measures 3.8 x 1.9 x 2.9 cm.  Pulsed Doppler evaluation demonstrates normal low-resistance arterial and venous waveforms in both ovaries.  A small amount of free fluid is noted within the pelvis and about the right adnexa.  IMPRESSION:  1.  Large complex heterogeneous region of soft tissue at the right adnexa, measuring 9.5 x 3.9 x 3.2 cm.  This is thought to reflect right ovary and adjacent blood clot; a 2.2 cm cystic focus with a significantly thickened wall may reflect a prominent follicle or a blighted ectopic pregnancy, given the patient's negative beta-HCG. Surrounding free fluid noted.  Given lack of associated blood flow and fitting of the region to surrounding soft tissues, a mass is considered unlikely.  Would consider a repeat pregnancy test, to ensure that this does not reflect a living ectopic pregnancy.  2.  No evidence for ovarian torsion.  The patient demonstrates severe tenderness on both transabdominal and transvaginal exam.  These results were called by telephone on 11/06/2012 at 02:54 a.m. to Dr. Marisa Severin, who verbally acknowledged these results.   Original Report Authenticated By: Tonia Ghent, M.D.    [place] Art/ven Flow Abd Pelv Doppler  11/06/2012  *RADIOLOGY REPORT*  Clinical Data:  Pelvic pain and pressure.  TRANSABDOMINAL AND TRANSVAGINAL ULTRASOUND OF PELVIS DOPPLER ULTRASOUND OF OVARIES  Technique:  Both transabdominal and transvaginal ultrasound examinations of the pelvis were performed. Transabdominal technique was performed for global imaging of the pelvis including uterus, ovaries, adnexal regions, and pelvic cul-de-sac.  It was necessary to proceed with endovaginal  exam following the transabdominal exam to visualize the uterus and ovaries in greater detail.  Color and duplex Doppler ultrasound was utilized to evaluate blood flow to the ovaries.  Comparison:  Pelvic ultrasound performed 08/21/2010  Findings:  Uterus:  Normal in size and appearance; measures 11.6 x 5.3 x 5.7 cm.  A few Nabothian cysts are noted at the cervix.  Endometrium:  Normal in thickness and appearance; measures 1.0 cm in thickness.  Right ovary: There is a large complex heterogeneous region of soft tissue noted at the right adnexa, measuring 9.5 x 3.9 x 3.0 cm. This is thought to reflect right ovary and adjacent clot; a 2.2 cm cystic focus with a significantly thickened wall may reflect a prominent follicle or a blighted ectopic pregnancy, given the patient's negative beta-HCG.  Left ovary:   Normal appearance/no adnexal mass; measures 3.8 x 1.9 x 2.9 cm.  Pulsed Doppler evaluation  demonstrates normal low-resistance arterial and venous waveforms in both ovaries.  A small amount of free fluid is noted within the pelvis and about the right adnexa.  IMPRESSION:  1.  Large complex heterogeneous region of soft tissue at the right adnexa, measuring 9.5 x 3.9 x 3.2 cm.  This is thought to reflect right ovary and adjacent blood clot; a 2.2 cm cystic focus with a significantly thickened wall may reflect a prominent follicle or a blighted ectopic pregnancy, given the patient's negative beta-HCG. Surrounding free fluid noted.  Given lack of associated blood flow and fitting of the region to surrounding soft tissues, a mass is considered unlikely.  Would consider a repeat pregnancy test, to ensure that this does not reflect a living ectopic pregnancy.  2.  No evidence for ovarian torsion.  The patient demonstrates severe tenderness on both transabdominal and transvaginal exam.  These results were called by telephone on 11/06/2012 at 02:54 a.m. to Dr. Marisa Severin, who verbally acknowledged these results.   Original  Report Authenticated By: Tonia Ghent, M.D.      1. Ovarian cyst, right       MDM  26 year old female with dyspareunia, lower pelvic pain after sex. Concern for ovarian cyst, possible ovarian torsion. Patient feeling improved after pain medication awaiting ultrasound of pelvis.  6:28 AM D/w Dr Marice Potter on call for GYN.  She requests CA 125 and follow up in her clinic this week.  Pt informed of results and plan.       Olivia Mackie, MD 11/06/12 [phone]

## 2012-11-06 NOTE — ED Notes (Signed)
Pt returned from US

## 2012-11-08 LAB — GC/CHLAMYDIA PROBE AMP, GENITAL: GC Probe Amp, Genital: NEGATIVE

## 2012-11-09 ENCOUNTER — Ambulatory Visit (INDEPENDENT_AMBULATORY_CARE_PROVIDER_SITE_OTHER): Payer: Self-pay | Admitting: Obstetrics & Gynecology

## 2012-11-09 ENCOUNTER — Encounter: Payer: Self-pay | Admitting: Obstetrics & Gynecology

## 2012-11-09 VITALS — BP 137/90 | HR 82 | Temp 99.2°F | Resp 20 | Ht 67.5 in | Wt 211.6 lb

## 2012-11-09 DIAGNOSIS — R102 Pelvic and perineal pain: Secondary | ICD-10-CM

## 2012-11-09 DIAGNOSIS — N83209 Unspecified ovarian cyst, unspecified side: Secondary | ICD-10-CM

## 2012-11-09 DIAGNOSIS — N949 Unspecified condition associated with female genital organs and menstrual cycle: Secondary | ICD-10-CM

## 2012-11-09 MED ORDER — TRAMADOL HCL 50 MG PO TABS: 50.0000 mg | ORAL_TABLET | Freq: Four times a day (QID) | ORAL | Status: DC | PRN

## 2012-11-09 MED ORDER — NORGESTIMATE-ETH ESTRADIOL 0.25-35 MG-MCG PO TABS: 1.0000 | ORAL_TABLET | Freq: Every day | ORAL | Status: DC

## 2012-11-09 NOTE — Patient Instructions (Addendum)
Ovarian Cyst The ovaries are small organs that are on each side of the uterus. The ovaries are the organs that produce the female hormones, estrogen and progesterone. An ovarian cyst is a sac filled with fluid that can vary in its size. It is normal for a small cyst to form in women who are in the childbearing age and who have menstrual periods. This type of cyst is called a follicle cyst that becomes an ovulation cyst (corpus luteum cyst) after it produces the women's egg. It later goes away on its own if the woman does not become pregnant. There are other kinds of ovarian cysts that may cause problems and may need to be treated. The most serious problem is a cyst with cancer. It should be noted that menopausal women who have an ovarian cyst are at a higher risk of it being a cancer cyst. They should be evaluated very quickly, thoroughly and followed closely. This is especially true in menopausal women because of the high rate of ovarian cancer in women in menopause. CAUSES AND TYPES OF OVARIAN CYSTS:  FUNCTIONAL CYST: The follicle/corpus luteum cyst is a functional cyst that occurs every month during ovulation with the menstrual cycle. They go away with the next menstrual cycle if the woman does not get pregnant. Usually, there are no symptoms with a functional cyst.  ENDOMETRIOMA CYST: This cyst develops from the lining of the uterus tissue. This cyst gets in or on the ovary. It grows every month from the bleeding during the menstrual period. It is also called a "chocolate cyst" because it becomes filled with blood that turns brown. This cyst can cause pain in the lower abdomen during intercourse and with your menstrual period.  CYSTADENOMA CYST: This cyst develops from the cells on the outside of the ovary. They usually are not cancerous. They can get very big and cause lower abdomen pain and pain with intercourse. This type of cyst can twist on itself, cut off its blood supply and cause severe pain. It  also can easily rupture and cause a lot of pain.  DERMOID CYST: This type of cyst is sometimes found in both ovaries. They are found to have different kinds of body tissue in the cyst. The tissue includes skin, teeth, hair, and/or cartilage. They usually do not have symptoms unless they get very big. Dermoid cysts are rarely cancerous.  POLYCYSTIC OVARY: This is a rare condition with hormone problems that produces many small cysts on both ovaries. The cysts are follicle-like cysts that never produce an egg and become a corpus luteum. It can cause an increase in body weight, infertility, acne, increase in body and facial hair and lack of menstrual periods or rare menstrual periods. Many women with this problem develop type 2 diabetes. The exact cause of this problem is unknown. A polycystic ovary is rarely cancerous.  THECA LUTEIN CYST: Occurs when too much hormone (human chorionic gonadotropin) is produced and over-stimulates the ovaries to produce an egg. They are frequently seen when doctors stimulate the ovaries for invitro-fertilization (test tube babies).  LUTEOMA CYST: This cyst is seen during pregnancy. Rarely it can cause an obstruction to the birth canal during labor and delivery. They usually go away after delivery. SYMPTOMS   Pelvic pain or pressure.  Pain during sexual intercourse.  Increasing girth (swelling) of the abdomen.  Abnormal menstrual periods.  Increasing pain with menstrual periods.  You stop having menstrual periods and you are not pregnant. DIAGNOSIS  The diagnosis can   be made during:  Routine or annual pelvic examination (common).  Ultrasound.  X-ray of the pelvis.  CT Scan.  MRI.  Blood tests. TREATMENT   Treatment may only be to follow the cyst monthly for 2 to 3 months with your caregiver. Many go away on their own, especially functional cysts.  May be aspirated (drained) with a long needle with ultrasound, or by laparoscopy (inserting a tube into  the pelvis through a small incision).  The whole cyst can be removed by laparoscopy.  Sometimes the cyst may need to be removed through an incision in the lower abdomen.  Hormone treatment is sometimes used to help dissolve certain cysts.  Birth control pills are sometimes used to help dissolve certain cysts. HOME CARE INSTRUCTIONS  Follow your caregiver's advice regarding:  Medicine.  Follow up visits to evaluate and treat the cyst.  You may need to come back or make an appointment with another caregiver, to find the exact cause of your cyst, if your caregiver is not a gynecologist.  Get your yearly and recommended pelvic examinations and Pap tests.  Let your caregiver know if you have had an ovarian cyst in the past. SEEK MEDICAL CARE IF:   Your periods are late, irregular, they stop, or are painful.  Your stomach (abdomen) or pelvic pain does not go away.  Your stomach becomes larger or swollen.  You have pressure on your bladder or trouble emptying your bladder completely.  You have painful sexual intercourse.  You have feelings of fullness, pressure, or discomfort in your stomach.  You lose weight for no apparent reason.  You feel generally ill.  You become constipated.  You lose your appetite.  You develop acne.  You have an increase in body and facial hair.  You are gaining weight, without changing your exercise and eating habits.  You think you are pregnant. SEEK IMMEDIATE MEDICAL CARE IF:   You have increasing abdominal pain.  You feel sick to your stomach (nausea) and/or vomit.  You develop a fever that comes on suddenly.  You develop abdominal pain during a bowel movement.  Your menstrual periods become heavier than usual. Document Released: 12/14/2005 Document Revised: 03/07/2012 Document Reviewed: 10/17/2009 [employer] Patient Information 2013 [employer].  

## 2012-11-09 NOTE — Addendum Note (Signed)
Addended by: Willodean Rosenthal on: 11/09/2012 02:14 PM   Modules accepted: Orders

## 2012-11-09 NOTE — Progress Notes (Signed)
Pt states she has been having abdominal pain x3 months. This happens approximately 3 times per week. Her abdomen becomes swollen during these times of pain. She reports that she does have occasional constipation. She takes no meds for constipation. She had last BM last night.

## 2012-11-25 ENCOUNTER — Inpatient Hospital Stay (HOSPITAL_COMMUNITY)
Admission: AD | Admit: 2012-11-25 | Discharge: 2012-11-26 | Disposition: A | Discharge status: Home / Self Care | Payer: Self-pay | Source: Ambulatory Visit | Attending: Family Medicine | Admitting: Family Medicine

## 2012-11-25 ENCOUNTER — Encounter (HOSPITAL_COMMUNITY): Payer: Self-pay | Admitting: *Deleted

## 2012-11-25 DIAGNOSIS — N390 Urinary tract infection, site not specified: Secondary | ICD-10-CM | POA: Insufficient documentation

## 2012-11-25 DIAGNOSIS — R109 Unspecified abdominal pain: Secondary | ICD-10-CM | POA: Insufficient documentation

## 2012-11-25 DIAGNOSIS — R319 Hematuria, unspecified: Secondary | ICD-10-CM | POA: Insufficient documentation

## 2012-11-25 LAB — POCT PREGNANCY, URINE: Preg Test, Ur: NEGATIVE

## 2012-11-25 NOTE — MAU Note (Signed)
I've had abdominal pain for several months. ? Cyst or something - I have u/s on 12/5 in clinic downstairs. I'm really tired. Ok when sitting but when I stand and do something I have pain and pressure in pelvis, rectum, and lower stomach. For past few days I'm seeing blood in my urine but no pain when I pee. Nauseated for past several months since stomach pain started.

## 2012-11-26 LAB — URINALYSIS, ROUTINE W REFLEX MICROSCOPIC
Glucose, UA: NEGATIVE mg/dL
Specific Gravity, Urine: >1.03 — ABNORMAL HIGH (ref 1.005–1.030)

## 2012-11-26 LAB — URINE MICROSCOPIC-ADD ON

## 2012-11-26 MED ORDER — CIPROFLOXACIN HCL 500 MG PO TABS: 500.0000 mg | ORAL_TABLET | Freq: Two times a day (BID) | ORAL | Status: DC

## 2012-11-26 MED ORDER — CIPROFLOXACIN HCL 500 MG PO TABS
500.0000 mg | ORAL_TABLET | Freq: Once | ORAL | Status: AC
  Administered 2012-11-26: 500 mg via ORAL
  Filled 2012-11-26: qty 1

## 2012-11-26 NOTE — MAU Provider Note (Signed)
Chart reviewed and agree with management and plan.  

## 2012-11-26 NOTE — MAU Provider Note (Signed)
History     CSN: [id number]  Arrival date and time: 11/25/12 2309   First Provider Initiated Contact with Patient 11/26/12 0032      Chief Complaint  Patient presents with  . Hematuria  . Abdominal Pain  . Fatigue   HPI [name] [name] 26 y.o.  Comes to MAU with blood when she urinates.  No bleeding at any other time.  Denies dysuria.  Has lower abdominal tenderness for several weeks and is being followed in GYN clinic for that tenderness.  Has an ultrasound scheduled on 12-01-12.  OB History    Grav Para Term Preterm Abortions TAB SAB Ect Mult Living   8 6 6  0 2 1 1   6       Past Medical History  Diagnosis Date  . ITP (idiopathic thrombocytopenic purpura)   . ITP (idiopathic thrombocytopenic purpura)     Past Surgical History  Procedure Date  . Surgical repair to wrist     Family History  Problem Relation Age of Onset  . Cancer Father     leukemia  . Fibroids Mother   . Thrombocytopenia Brother     ITP    History  Substance Use Topics  . Smoking status: Current Some Day Smoker -- 0.5 packs/day for 10 years  . Smokeless tobacco: Not on file  . Alcohol Use: No    Allergies: No Known Allergies  Prescriptions prior to admission  Medication Sig Dispense Refill  . ibuprofen (ADVIL,MOTRIN) 800 MG tablet Take 1 tablet (800 mg total) by mouth every 8 (eight) hours as needed for pain.  30 tablet  0  . norgestimate-ethinyl estradiol (ORTHO-CYCLEN,SPRINTEC,PREVIFEM) 0.25-35 MG-MCG tablet Take 1 tablet by mouth daily.  1 Package  11  . oxyCODONE-acetaminophen (PERCOCET/ROXICET) 5-325 MG per tablet Take 2 tablets by mouth every 6 (six) hours as needed for pain.  30 tablet  0  . traMADol (ULTRAM) 50 MG tablet Take 1 tablet (50 mg total) by mouth every 6 (six) hours as needed.  30 tablet  0    Review of Systems  Constitutional: Negative for fever.  Gastrointestinal: Positive for abdominal pain. Negative for nausea and vomiting.  Genitourinary: Positive for  hematuria. Negative for dysuria, urgency, frequency and flank pain.   Physical Exam   Blood pressure 124/75, pulse 88, temperature 98.2 F (36.8 C), temperature source Oral, resp. rate 20, height 5\' 7"  (1.702 m), weight 98.612 kg (217 lb 6.4 oz), last menstrual period 11/11/2012.  Physical Exam  Nursing note and vitals reviewed. Constitutional: She is oriented to person, place, and time. She appears well-developed and well-nourished.  HENT:  Head: Normocephalic.  Eyes: EOM are normal.  Neck: Neck supple.  GI: Soft. There is tenderness. There is no rebound and no guarding.  Genitourinary:       No CVA tenderness  Musculoskeletal: Normal range of motion.  Neurological: She is alert and oriented to person, place, and time.  Skin: Skin is warm and dry.  Psychiatric: She has a normal mood and affect.    MAU Course  Procedures  MDM Results for orders placed during the hospital encounter of 11/25/12 (from the past 24 hour(s))  URINALYSIS, ROUTINE W REFLEX MICROSCOPIC     Status: Abnormal   Collection Time   11/25/12 11:45 PM      Component Value Range   Color, Urine ORANGE (*) YELLOW   APPearance CLEAR  CLEAR   Specific Gravity, Urine >1.030 (*) 1.005 - 1.030   pH 5.5  5.0 -  8.0   Glucose, UA NEGATIVE  NEGATIVE mg/dL   Hgb urine dipstick LARGE (*) NEGATIVE   Bilirubin Urine NEGATIVE  NEGATIVE   Ketones, ur NEGATIVE  NEGATIVE mg/dL   Protein, ur 30 (*) NEGATIVE mg/dL   Urobilinogen, UA 0.2  0.0 - 1.0 mg/dL   Nitrite NEGATIVE  NEGATIVE   Leukocytes, UA NEGATIVE  NEGATIVE  URINE MICROSCOPIC-ADD ON     Status: Abnormal   Collection Time   11/25/12 11:45 PM      Component Value Range   Squamous Epithelial / LPF FEW (*) RARE   WBC, UA 0-2  <3 WBC/hpf   RBC / HPF 7-10  <3 RBC/hpf   Bacteria, UA FEW (*) RARE  POCT PREGNANCY, URINE     Status: Normal   Collection Time   11/25/12 11:55 PM      Component Value Range   Preg Test, Ur NEGATIVE  NEGATIVE   Urine culture  pending  Assessment and Plan  UTI with hematuria  Plan Rx cipro 500 mg po bid x 3 days (#6) No refills and one dose in MAU tonight. Get your prescription filled and take all as directed. Keep appointments as scheduled with the clinic If the blood in your urine continues or if you develop nausea and vomiting or body/back aches, you will need to be seen at an Urgent Care.  [name] [name] 11/26/2012, 12:39 AM

## 2012-11-26 NOTE — Progress Notes (Signed)
Written and verbal d/c instructions given and understanding voiced. 

## 2012-12-01 ENCOUNTER — Ambulatory Visit (HOSPITAL_COMMUNITY)
Admission: RE | Admit: 2012-12-01 | Discharge: 2012-12-01 | Disposition: A | Discharge status: Home / Self Care | Payer: Self-pay | Source: Ambulatory Visit | Attending: Obstetrics & Gynecology | Admitting: Obstetrics & Gynecology

## 2012-12-01 DIAGNOSIS — R102 Pelvic and perineal pain: Secondary | ICD-10-CM

## 2012-12-01 DIAGNOSIS — N831 Corpus luteum cyst of ovary, unspecified side: Secondary | ICD-10-CM | POA: Insufficient documentation

## 2012-12-01 DIAGNOSIS — N83209 Unspecified ovarian cyst, unspecified side: Secondary | ICD-10-CM

## 2012-12-01 DIAGNOSIS — N949 Unspecified condition associated with female genital organs and menstrual cycle: Secondary | ICD-10-CM | POA: Insufficient documentation

## 2012-12-01 DIAGNOSIS — N9489 Other specified conditions associated with female genital organs and menstrual cycle: Secondary | ICD-10-CM | POA: Insufficient documentation

## 2012-12-01 IMAGING — US US TRANSVAGINAL NON-OB
1 series · 13 of 25 positions shown · non-contrast
Comparison: [DATE]

CLINICAL DATA: Pelvic pain and left mass.  LMP [DATE]

TRANSVAGINAL ULTRASOUND OF PELVIS
TECHNIQUE: Transvaginal ultrasound examination of the pelvis was
performed including evaluation of the uterus, ovaries, adnexal
regions, and pelvic cul-de-sac.

[Series 1: us transvaginal non-ob · 13 of 41 slices shown]
[im 1/41]
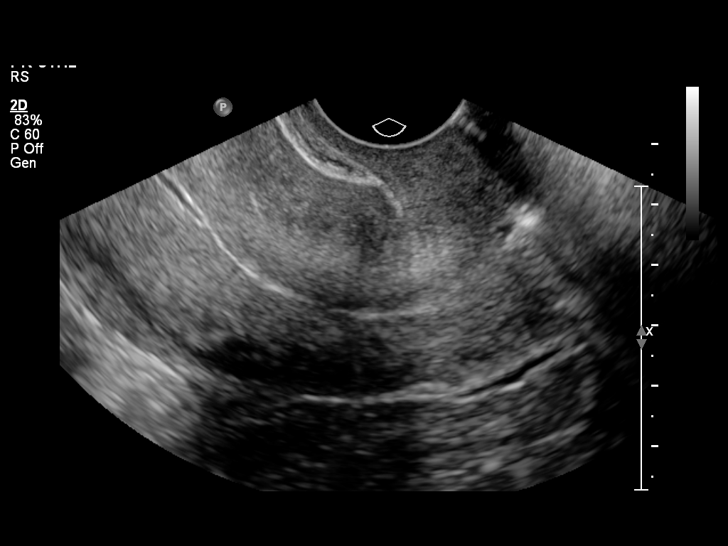
[im 4/41]
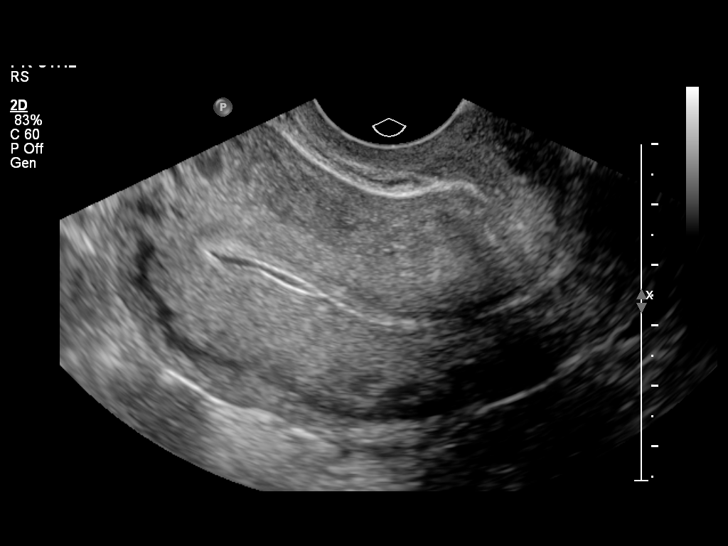
[im 7/41]
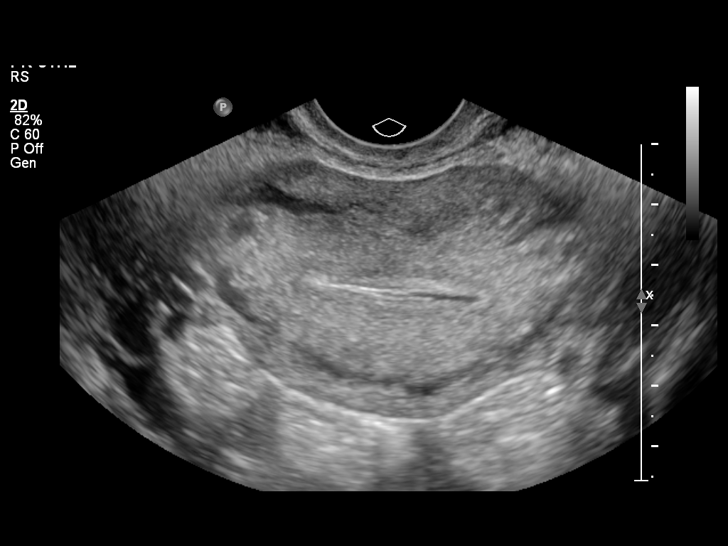
[im 11/41]
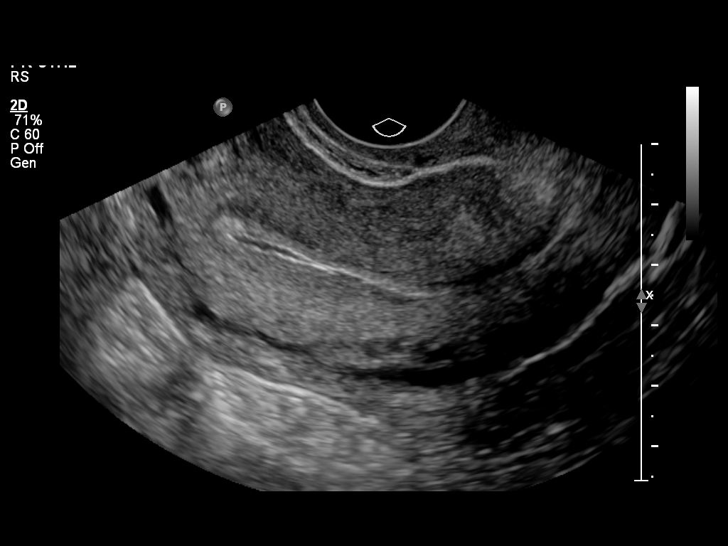
[im 14/41]
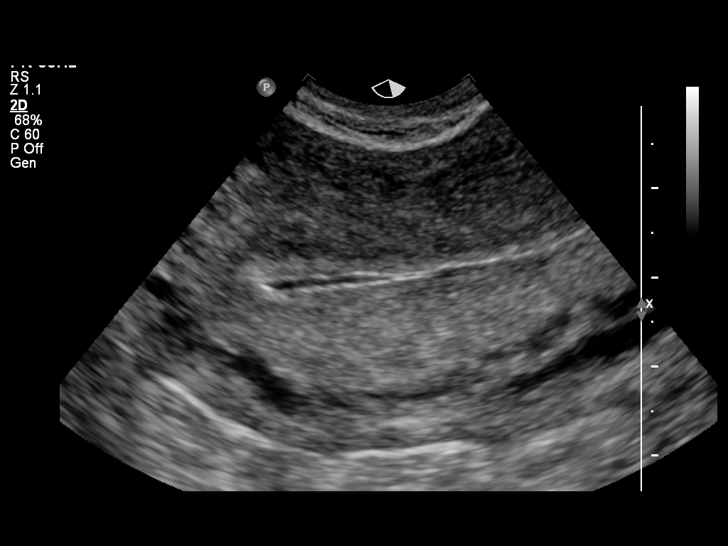
[im 17/41]
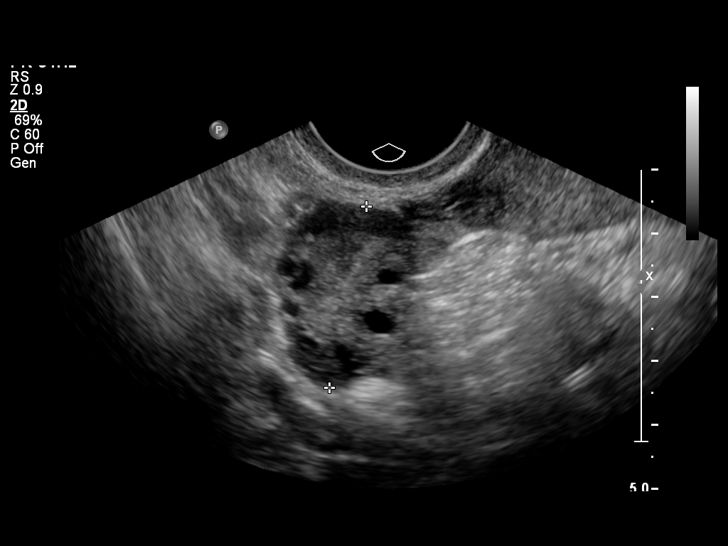
[im 21/41]
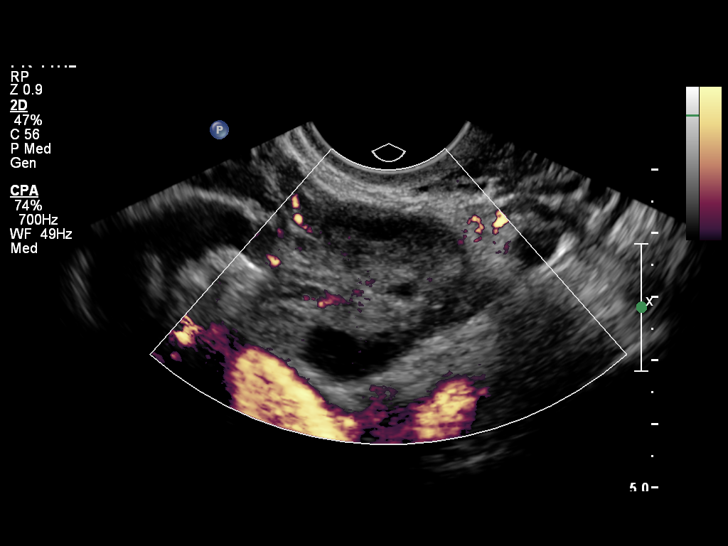
[im 24/41]
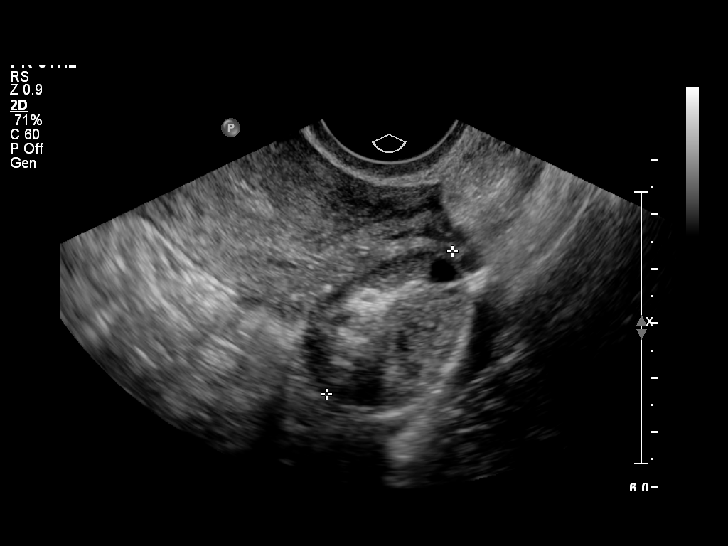
[im 27/41]
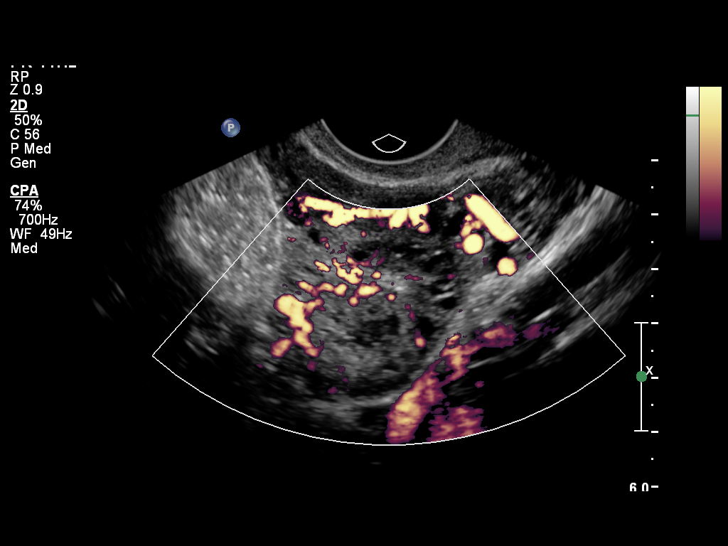
[im 31/41]
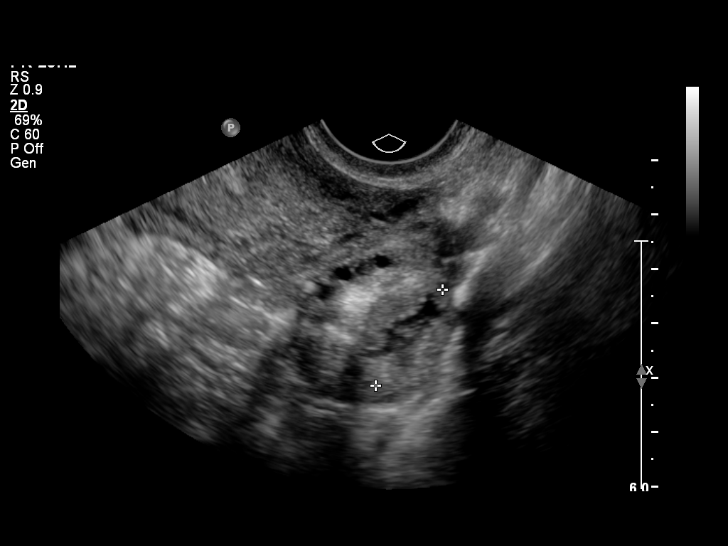
[im 34/41]
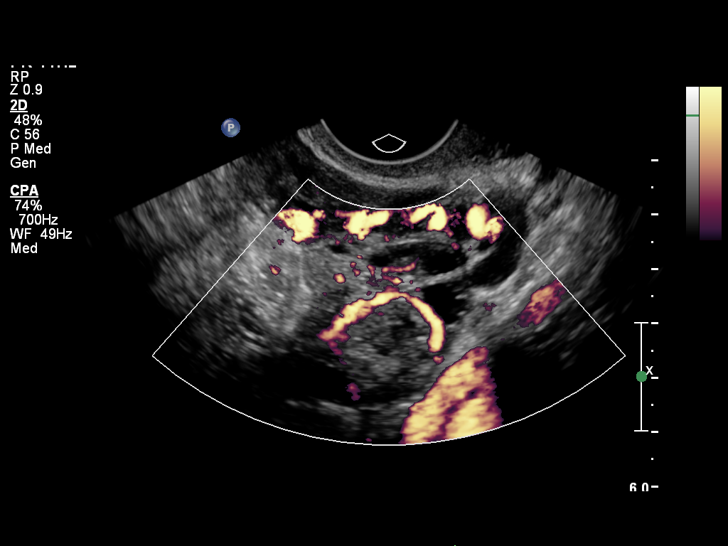
[im 37/41]
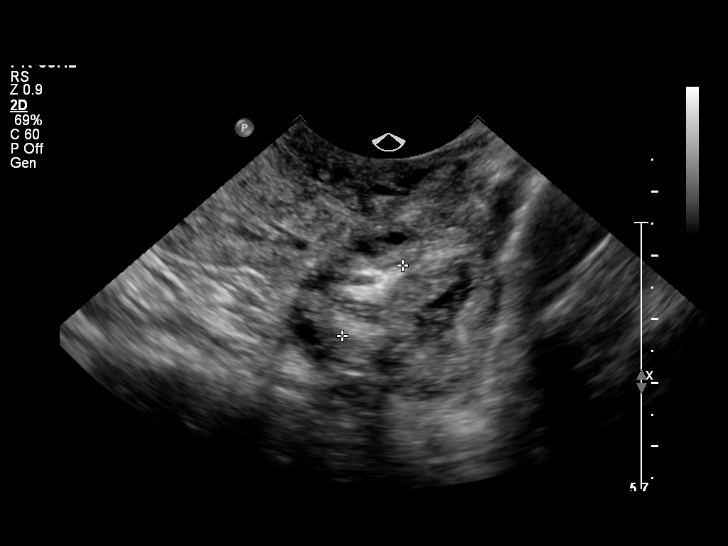
[im 41/41]
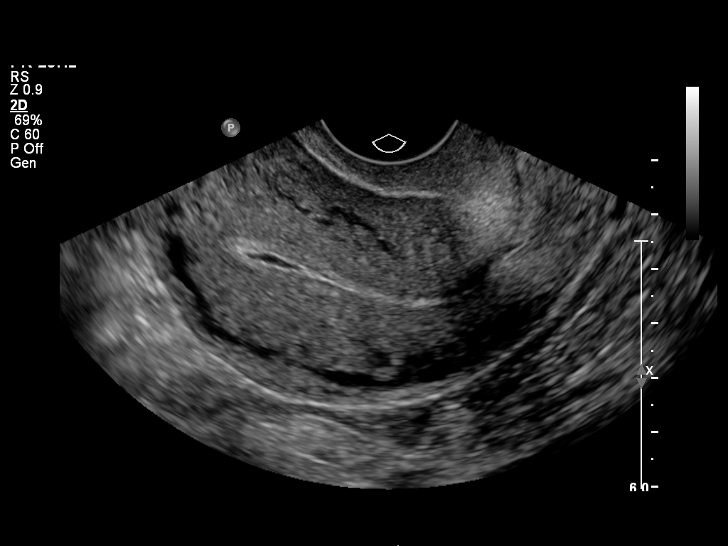

[13 of 25 positions shown; findings below may reference images not displayed]

FINDINGS: Uterus:  Is anteverted and anteflexed and has a sagittal length of
10.0 cm, depth of 4.4 cm in width of 6.1 cm.  A homogeneous
myometrium is seen.

Endometrium: Is thin and echogenic with a width of 3.7 mm.  This
would correlate with a postsecretory endometrial stripe and
correspond with the provided LMP of [DATE].

Right ovary: Has a normal appearance measuring 2.6 x 3.1 x 2.9 cm.
The previously noted avascular soft tissue density contiguous with
the right ovary is no longer present and may have represented clot
associated with rupture of a prior hemorrhagic cyst.

Left ovary: Measures 4.1 x 2.7 x 3.5 cm and contains a corpus
luteum.  A small echogenic focus is seen within the ovarian stroma
measuring 1.5 x 1.7 x 1.2 cm.  This is unchanged in comparison with
the prior exam making it likely to represent a small dermoid.

Other Findings:  A trace of simple free fluid is noted in the cul-
de-sac.
IMPRESSION: Normal uterine myometrium and endometrium.

Interval resolution of right adnexal avascular soft tissue since
the prior exam is most compatible with resolved clot from a
ruptured hemorrhagic cyst.

Probable small left ovarian dermoid.

## 2012-12-07 ENCOUNTER — Encounter: Payer: Self-pay | Admitting: Obstetrics & Gynecology

## 2012-12-07 ENCOUNTER — Ambulatory Visit (INDEPENDENT_AMBULATORY_CARE_PROVIDER_SITE_OTHER): Payer: Self-pay | Admitting: Obstetrics & Gynecology

## 2012-12-07 VITALS — BP 119/82 | HR 72 | Temp 99.2°F | Resp 20 | Ht 67.0 in | Wt 214.9 lb

## 2012-12-07 DIAGNOSIS — R102 Pelvic and perineal pain: Secondary | ICD-10-CM

## 2012-12-07 NOTE — Patient Instructions (Signed)
Pelvic Pain Pelvic pain is pain below the belly button and located between your hips. Acute pain may last a few hours or days. Chronic pelvic pain may last weeks and months. The cause may be different for different types of pain. The pain may be dull or sharp, mild or severe and can interfere with your daily activities. Write down and tell your caregiver:   Exactly where the pain is located.  If it comes and goes or is there all the time.  When it happens (with sex, urination, bowel movement, etc.)  If the pain is related to your menstrual period or stress. Your caregiver will take a full history and do a complete physical exam and Pap test. CAUSES   Painful menstrual periods (dysmenorrhea).  Normal ovulation (Mittelschmertz) that occurs in the middle of the menstrual cycle every month.  The pelvic organs get engorged with blood just before the menstrual period (pelvic congestive syndrome).  Scar tissue from an infection or past surgery (pelvic adhesions).  Cancer of the female pelvic organs. When there is pain with cancer, it has been there for a long time.  The lining of the uterus (endometrium) abnormally grows in places like the pelvis and on the pelvic organs (endometriosis).  A form of endometriosis with the lining of the uterus present inside of the muscle tissue of the uterus (adenomyosis).  Fibroid tumor (noncancerous) in the uterus.  Bladder problems such as infection, bladder spasms of the muscle tissue of the bladder.  Intestinal problems (irritable bowel syndrome, colitis, an ulcer or gastrointestinal infection).  Polyps of the cervix or uterus.  Pregnancy in the tube (ectopic pregnancy).  The opening of the cervix is too small for the menstrual blood to flow through it (cervical stenosis).  Physical or sexual abuse (past or present).  Musculo-skeletal problems from poor posture, problems with the vertebrae of the lower back or the uterine pelvic muscles falling  (prolapse).  Psychological problems such as depression or stress.  IUD (intrauterine device) in the uterus. DIAGNOSIS  Tests to make a diagnosis depends on the type, location, severity and what causes the pain to occur. Tests that may be needed include:  Blood tests.  Urine tests  Ultrasound.  X-rays.  CT Scan.  MRI.  Laparoscopy.  Major surgery. TREATMENT  Treatment will depend on the cause of the pain, which includes:  Prescription or over-the-counter pain medication.  Antibiotics.  Birth control pills.  Hormone treatment.  Nerve blocking injections.  Physical therapy.  Antidepressants.  Counseling with a psychiatrist or psychologist.  Minor or major surgery. HOME CARE INSTRUCTIONS   Only take over-the-counter or prescription medicines for pain, discomfort or fever as directed by your caregiver.  Follow your caregiver's advice to treat your pain.  Rest.  Avoid sexual intercourse if it causes the pain.  Apply warm or cold compresses (which ever works best) to the pain area.  Do relaxation exercises such as yoga or meditation.  Try acupuncture.  Avoid stressful situations.  Try group therapy.  If the pain is because of a stomach/intestinal upset, drink clear liquids, eat a bland light food diet until the symptoms go away. SEEK MEDICAL CARE IF:   You need stronger prescription pain medication.  You develop pain with sexual intercourse.  You have pain with urination.  You develop a temperature of 102 F (38.9 C) with the pain.  You are still in pain after 4 hours of taking prescription medication for the pain.  You need depression medication.    Your IUD is causing pain and you want it removed. SEEK IMMEDIATE MEDICAL CARE IF:  You develop very severe pain or tenderness.  You faint, have chills, severe weakness or dehydration.  You develop heavy vaginal bleeding or passing solid tissue.  You develop a temperature of 102 F (38.9 C)  with the pain.  You have blood in the urine.  You are being physically or sexually abused.  You have uncontrolled vomiting and diarrhea.  You are depressed and afraid of harming yourself or someone else. Document Released: 01/21/2005 Document Revised: 03/07/2012 Document Reviewed: 10/18/2008 Exit[employer] Patient Information 2013 [employer].  

## 2012-12-07 NOTE — Progress Notes (Signed)
Subjective:     Patient ID: [name] [name], female   DOB: 16-Aug-1986, 26 y.o.   MRN: [id number]  HPI Pt seen in the MAU and dx'd with a UTI for hematuria.  Pt did not get the meds filled because she did not agree with the dx.  She is now much better says that she has occ very min pain. No other sx noted.  Not taking any pain meds.  Came in for f/u and wants her work restrictions lifted.   Review of Systems     Objective:   Physical ExamBP 119/82  Pulse 72  Temp 99.2 F (37.3 C) (Oral)  Resp 20  Ht 5\' 7"  (1.702 m)  Wt 214 lb 14.4 oz (97.478 kg)  BMI 33.66 kg/m2  LMP 11/11/2012 Pt in NAD.  Exam deferred   sono 12/01/12 Comparison: 11/06/2012  Findings:  Uterus: Is anteverted and anteflexed and has a sagittal length of  10.0 cm, depth of 4.4 cm in width of 6.1 cm. A homogeneous  myometrium is seen.  Endometrium: Is thin and echogenic with a width of 3.7 mm. This  would correlate with a postsecretory endometrial stripe and  correspond with the provided LMP of 11/23/2012.  Right ovary: Has a normal appearance measuring 2.6 x 3.1 x 2.9 cm.  The previously noted avascular soft tissue density contiguous with  the right ovary is no longer present and may have represented clot  associated with rupture of a prior hemorrhagic cyst.  Left ovary: Measures 4.1 x 2.7 x 3.5 cm and contains a corpus  luteum. A small echogenic focus is seen within the ovarian stroma  measuring 1.5 x 1.7 x 1.2 cm. This is unchanged in comparison with  the prior exam making it likely to represent a small dermoid.  Other Findings: A trace of simple free fluid is noted in the cul-  de-sac.  IMPRESSION:  Normal uterine myometrium and endometrium.  Interval resolution of right adnexal avascular soft tissue since  the prior exam is most compatible with resolved clot from a  ruptured hemorrhagic cyst.  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      Assessment:     Pelvic pain- resolved  Reviewed sono results  with pt   Plan:     F/u prn May return to full activity at work    [employer], M.D., [place]n [place]

## 2012-12-07 NOTE — Progress Notes (Signed)
Pt seen @ MAU on 11/29 for hematuria. She was prescribed Cipro for UTI but states that she did not fill the Rx because she "felt like they were just rushing her out and she did not believe she had UTI".

## 2012-12-12 ENCOUNTER — Other Ambulatory Visit: Payer: Self-pay | Admitting: Internal Medicine

## 2012-12-28 NOTE — L&D Delivery Note (Signed)
Delivery Note At 12:41 AM a viable female was delivered via Vaginal, Spontaneous Delivery (Presentation:OA).  APGAR: 8, 9; weight 8 lb 11 oz (3941 g).   Placenta status: Intact, Spontaneous.  Cord: 3 vessels with the following complications: None.  Cord pH: NA  Anesthesia: Epidural  Episiotomy: None Lacerations: None Suture Repair: None Est. Blood Loss (mL): 500  Mom to postpartum.  Baby to Nursery. Placenta to: BS Feeding: Breast Circ: OP Contraception: BTL  NPO Maintain IV and epidural CBC in am due to ITP. Continue Prednisone.  Mariadelosang Wynns 10/31/2013, 3:22 AM

## 2013-04-09 ENCOUNTER — Inpatient Hospital Stay (HOSPITAL_COMMUNITY): Payer: Medicaid Other

## 2013-04-09 ENCOUNTER — Encounter (HOSPITAL_COMMUNITY): Payer: Self-pay | Admitting: *Deleted

## 2013-04-09 ENCOUNTER — Inpatient Hospital Stay (HOSPITAL_COMMUNITY)
Admission: AD | Admit: 2013-04-09 | Discharge: 2013-04-09 | Disposition: A | Discharge status: Home / Self Care | Payer: Medicaid Other | Source: Ambulatory Visit | Attending: Obstetrics & Gynecology | Admitting: Obstetrics & Gynecology

## 2013-04-09 DIAGNOSIS — N83201 Unspecified ovarian cyst, right side: Secondary | ICD-10-CM

## 2013-04-09 DIAGNOSIS — Z1389 Encounter for screening for other disorder: Secondary | ICD-10-CM

## 2013-04-09 DIAGNOSIS — R109 Unspecified abdominal pain: Secondary | ICD-10-CM | POA: Insufficient documentation

## 2013-04-09 DIAGNOSIS — N949 Unspecified condition associated with female genital organs and menstrual cycle: Secondary | ICD-10-CM | POA: Insufficient documentation

## 2013-04-09 DIAGNOSIS — M549 Dorsalgia, unspecified: Secondary | ICD-10-CM | POA: Insufficient documentation

## 2013-04-09 DIAGNOSIS — Z3490 Encounter for supervision of normal pregnancy, unspecified, unspecified trimester: Secondary | ICD-10-CM

## 2013-04-09 DIAGNOSIS — D689 Coagulation defect, unspecified: Secondary | ICD-10-CM | POA: Insufficient documentation

## 2013-04-09 DIAGNOSIS — D693 Immune thrombocytopenic purpura: Secondary | ICD-10-CM | POA: Insufficient documentation

## 2013-04-09 LAB — URINALYSIS, ROUTINE W REFLEX MICROSCOPIC
Bilirubin Urine: NEGATIVE
Ketones, ur: NEGATIVE mg/dL
Nitrite: NEGATIVE
Urobilinogen, UA: 0.2 mg/dL (ref 0.0–1.0)

## 2013-04-09 LAB — CBC
HCT: 34.2 % — ABNORMAL LOW (ref 36.0–46.0)
Hemoglobin: 11.9 g/dL — ABNORMAL LOW (ref 12.0–15.0)
MCHC: 34.8 g/dL (ref 30.0–36.0)
MCV: 81.4 fL (ref 78.0–100.0)
WBC: 7 10*3/uL (ref 4.0–10.5)

## 2013-04-09 LAB — COMPREHENSIVE METABOLIC PANEL
Alkaline Phosphatase: 54 U/L (ref 39–117)
BUN: 9 mg/dL (ref 6–23)
Chloride: 101 mEq/L (ref 96–112)
Creatinine, Ser: 0.61 mg/dL (ref 0.50–1.10)
GFR calc Af Amer: >90 mL/min (ref >90)
Glucose, Bld: 83 mg/dL (ref 70–99)
Potassium: 3.3 mEq/L — ABNORMAL LOW (ref 3.5–5.1)
Total Bilirubin: 0.1 mg/dL — ABNORMAL LOW (ref 0.3–1.2)

## 2013-04-09 LAB — WET PREP, GENITAL

## 2013-04-09 LAB — RAPID URINE DRUG SCREEN, HOSP PERFORMED: Barbiturates: NOT DETECTED

## 2013-04-09 LAB — POCT PREGNANCY, URINE: Preg Test, Ur: POSITIVE — AB

## 2013-04-09 LAB — URINE MICROSCOPIC-ADD ON

## 2013-04-09 IMAGING — US US OB COMP LESS 14 WK
1 series · 14 of 21 positions shown · non-contrast
Comparison: Pelvic ultrasound [DATE]

CLINICAL DATA: Pelvic and back pain.

OBSTETRIC <14 WK ULTRASOUND
TECHNIQUE: Transabdominal ultrasound was performed for evaluation
of the gestation as well as the maternal uterus and adnexal
regions.

[Series 1: us ob comp +14 wk · 14 of 21 slices shown]
[im 1/21]
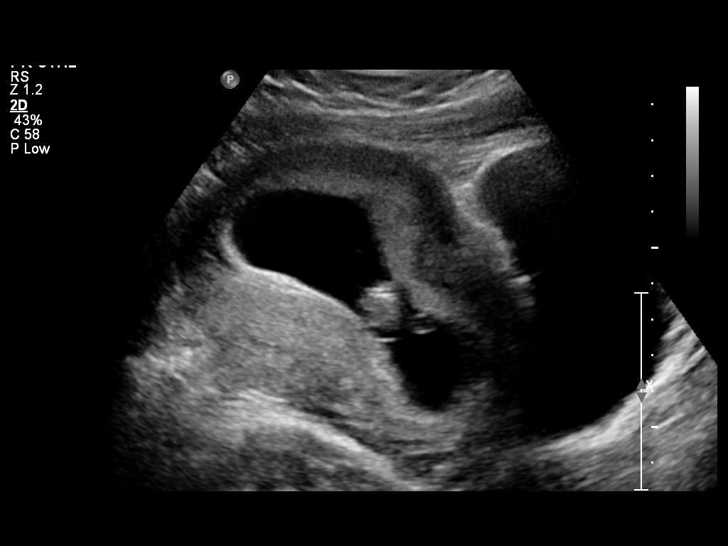
[im 3/21]
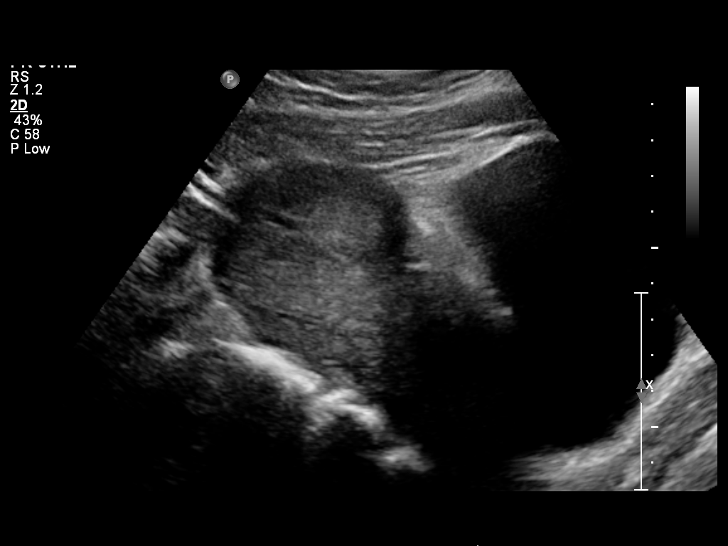
[im 4/21]
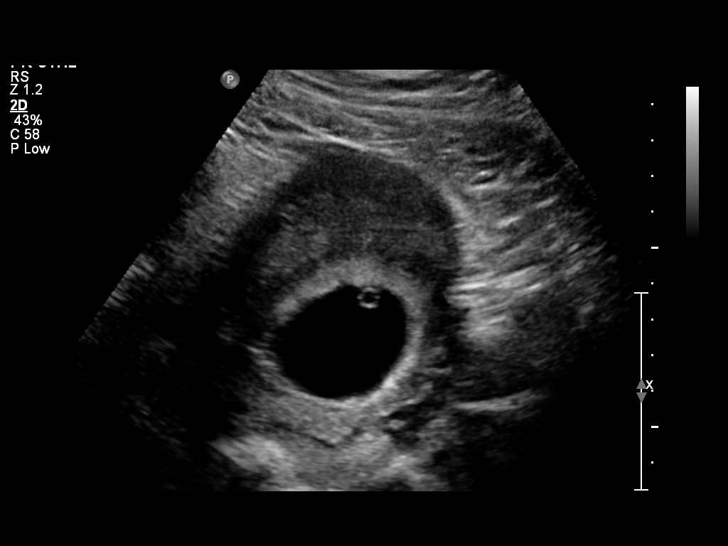
[im 6/21]
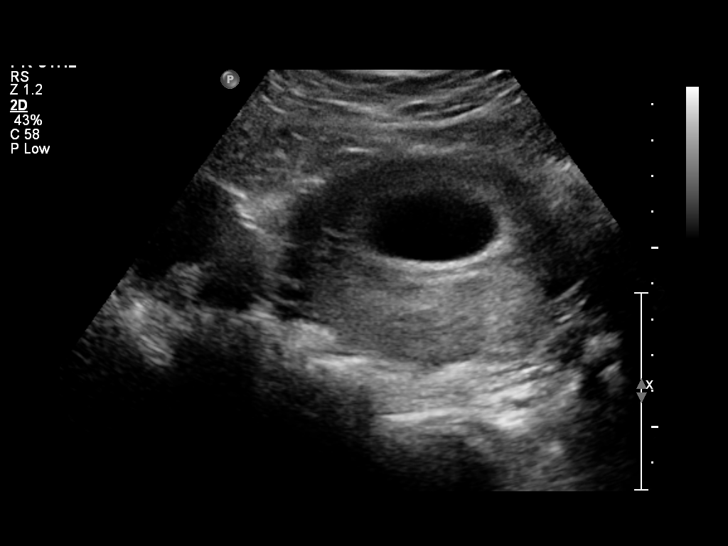
[im 7/21]
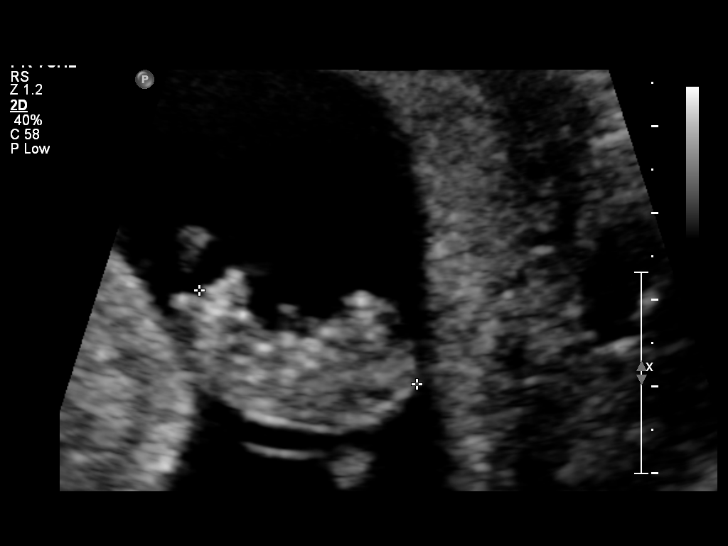
[im 9/21]
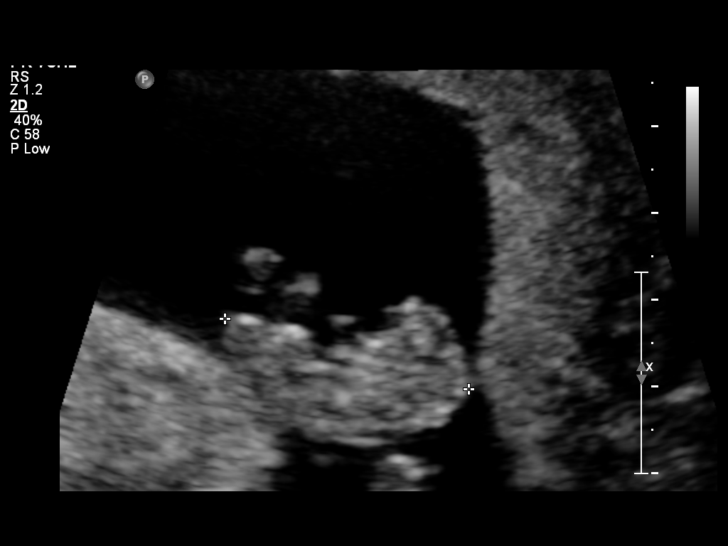
[im 10/21]
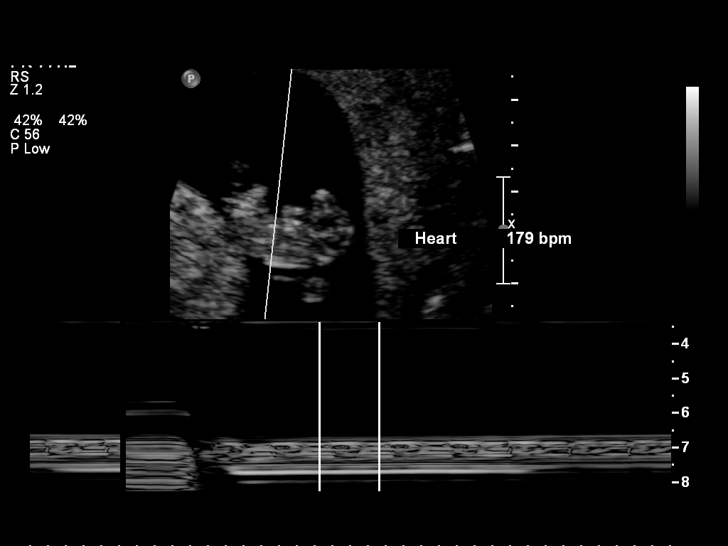
[im 12/21]
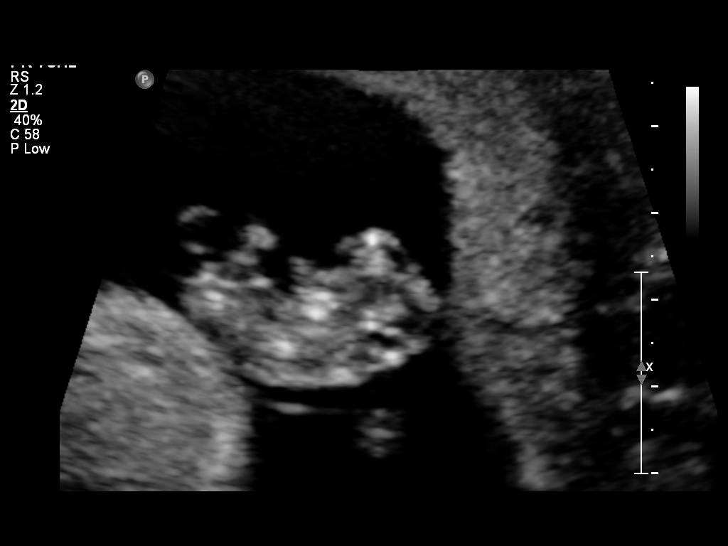
[im 13/21]
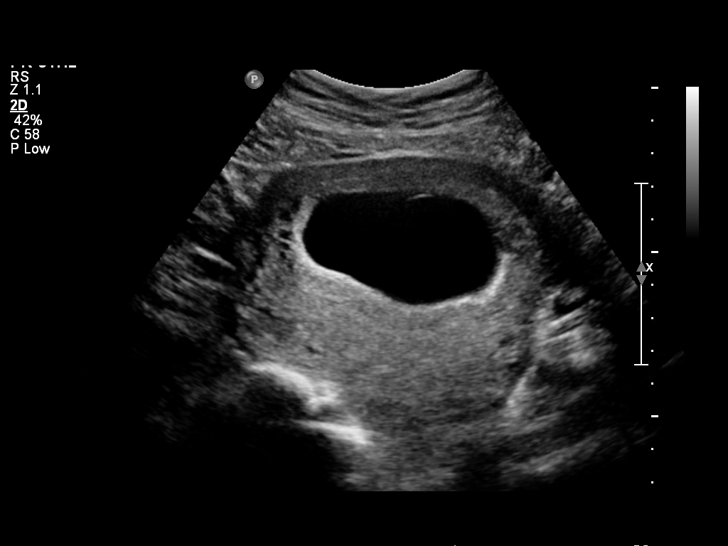
[im 15/21]
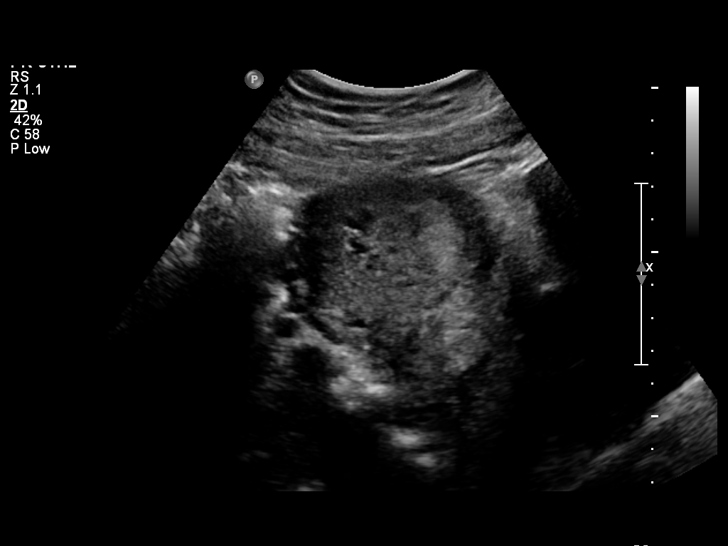
[im 16/21]
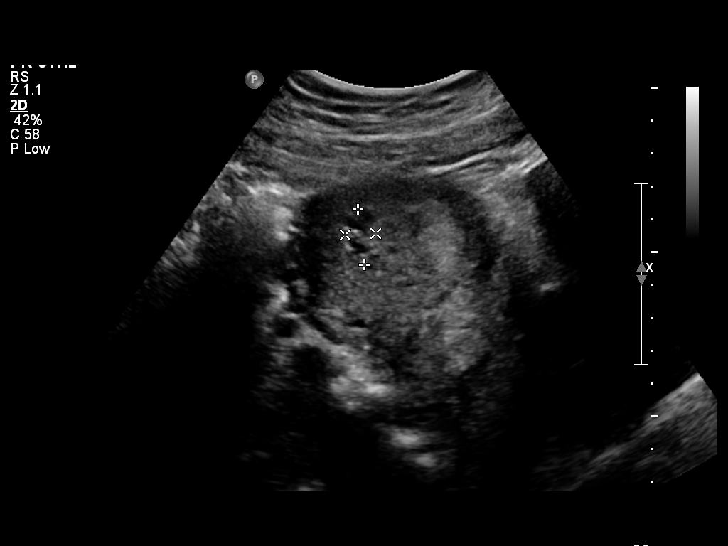
[im 18/21]
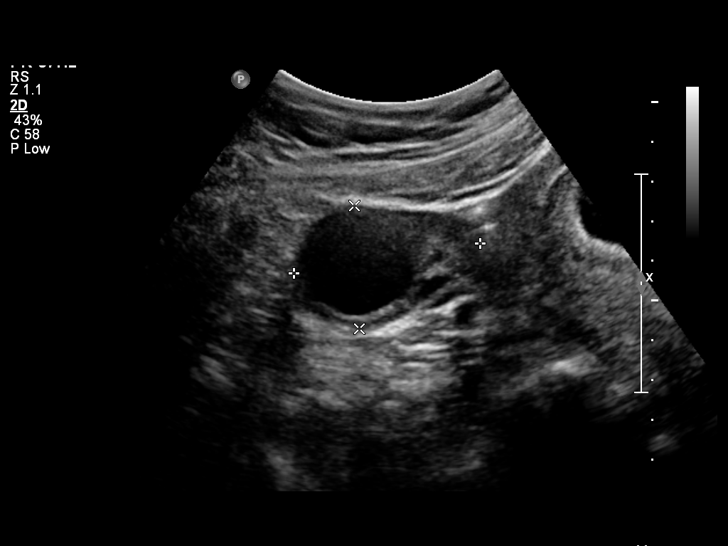
[im 19/21]
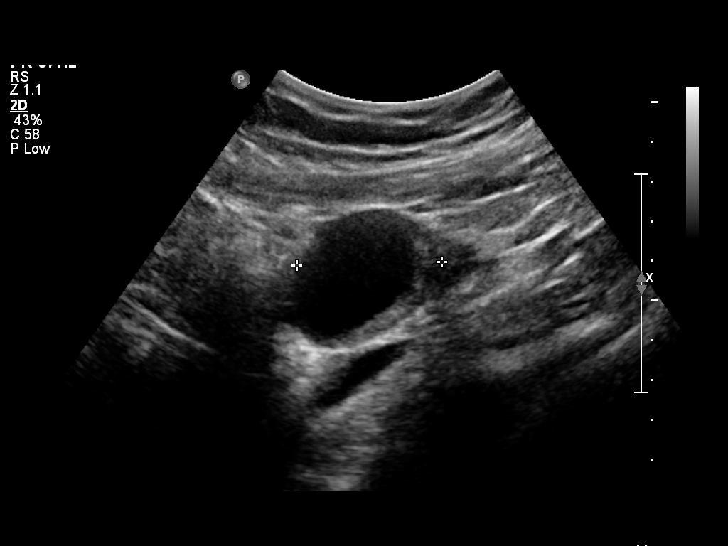
[im 21/21]
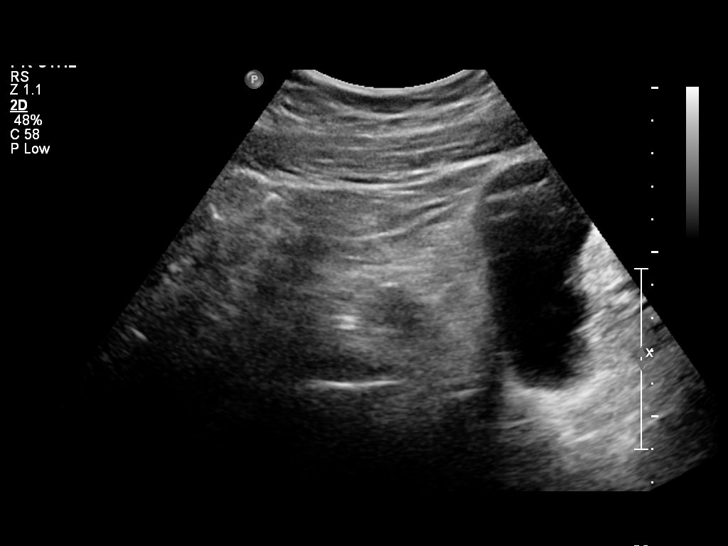

[14 of 21 positions shown; findings below may reference images not displayed]

Intrauterine gestational sac: Visualized/normal in shape.
Yolk sac: Present
Embryo: Present
Cardiac Activity: Present
Heart Rate: 179 bpm
CRL:  30.3 mm  10 w  0 d            US EDC: [DATE]

Maternal uterus/Adnexae:
There is a single intrauterine pregnancy.  Evidence for a small
subchorionic hemorrhage.  Normal appearance of the right ovary,
measuring 4.7 x 3.1 x 3.6 cm. There is a cystic structure in the
right ovary that measures up to 3.6 cm.  Left ovary is not
visualized.
IMPRESSION: Single live intrauterine pregnancy.  Calculated gestational age by
ultrasound is 10 weeks and 0 days.

## 2013-04-09 MED ORDER — TRAMADOL HCL 50 MG PO TABS: 50.0000 mg | ORAL_TABLET | Freq: Four times a day (QID) | ORAL | Status: DC | PRN

## 2013-04-09 MED ORDER — TRAMADOL HCL 50 MG PO TABS
50.0000 mg | ORAL_TABLET | Freq: Once | ORAL | Status: AC
  Administered 2013-04-09: 50 mg via ORAL
  Filled 2013-04-09: qty 1

## 2013-04-09 NOTE — MAU Provider Note (Signed)
History     CSN: [id number]  Arrival date and time: 04/09/13 1308   First Provider Initiated Contact with Patient 04/09/13 [phone]      Chief Complaint  Patient presents with  . Pelvic Pain  . Fever  . Back Pain  . Emesis  . Possible Pregnancy   HPI This is a 27 y.o. female at [redacted]w[redacted]d by Unsure LMP who presents with c/o feeling bad for a month. States has had a fever off and on. C/O back and pelvic pain, as well as vomiting.  States was bleeding every month. Was only using condoms when conceived. Did not know she was pregnant until today.  Wanted tubes tied but platelets were too low. Wants a guarantee of getting them tied this time.   RN Note: Pt presents with complaints of feeling awful for the last month. States she has had a fever on and off for 3 and 4 days a week for a month or even longer. States she is having back and pelvic pain. Vomiting every day or nauseated everyday.  OB History   Grav Para Term Preterm Abortions TAB SAB Ect Mult Living   9 6 6  0 2 1 1   6       Past Medical History  Diagnosis Date  . ITP (idiopathic thrombocytopenic purpura)   . ITP (idiopathic thrombocytopenic purpura)     Past Surgical History  Procedure Laterality Date  . Surgical repair to wrist      Family History  Problem Relation Age of Onset  . Cancer Father     leukemia  . Fibroids Mother   . Thrombocytopenia Brother     ITP    History  Substance Use Topics  . Smoking status: Current Some Day Smoker -- 0.50 packs/day for 10 years  . Smokeless tobacco: Not on file  . Alcohol Use: No    Allergies: No Known Allergies  Prescriptions prior to admission  Medication Sig Dispense Refill  . acetaminophen (TYLENOL) 500 MG tablet Take 1,000 mg by mouth daily as needed for pain or fever.      . Aspirin-Caffeine 845-65 MG PACK Take 1 packet by mouth every 8 (eight) hours as needed (pain).        Review of Systems  Constitutional: Positive for malaise/fatigue. Negative for fever  and chills.  Eyes: Negative for blurred vision.  Respiratory: Negative for cough.   Gastrointestinal: Positive for nausea, vomiting and abdominal pain. Negative for diarrhea and constipation.  Genitourinary: Negative for dysuria.  Musculoskeletal: Positive for back pain. Negative for myalgias.  Neurological: Positive for weakness. Negative for headaches.   Physical Exam   Blood pressure 150/81, pulse 125, temperature 99.2 F (37.3 C), resp. rate 18, height 5\' 8"  (1.727 m), weight 212 lb (96.163 kg), last menstrual period 12/11/2012.  Physical Exam  Constitutional: She is oriented to person, place, and time. She appears well-developed and well-nourished. No distress.  HENT:  Head: Normocephalic.  Cardiovascular: Normal rate, regular rhythm and normal heart sounds.   Respiratory: Effort normal and breath sounds normal. No respiratory distress. She has no wheezes. She has no rales. She exhibits no tenderness.  GI: Soft. She exhibits no distension and no mass. There is tenderness (over round ligaments). There is no rebound and no guarding.  Genitourinary: Vagina normal and uterus normal. No vaginal discharge found.  Uterus 10 wk size  Musculoskeletal: Normal range of motion.  Neurological: She is alert and oriented to person, place, and time.  Skin: Skin is  warm and dry.  Psychiatric: She has a normal mood and affect.    MAU Course  Procedures  MDM Results for orders placed during the hospital encounter of 04/09/13 (from the past 24 hour(s))  URINALYSIS, ROUTINE W REFLEX MICROSCOPIC     Status: Abnormal   Collection Time    04/09/13  8:40 AM      Result Value Range   Color, Urine YELLOW  YELLOW   APPearance CLEAR  CLEAR   Specific Gravity, Urine <1.005 (*) 1.005 - 1.030   pH 6.0  5.0 - 8.0   Glucose, UA NEGATIVE  NEGATIVE mg/dL   Hgb urine dipstick NEGATIVE  NEGATIVE   Bilirubin Urine NEGATIVE  NEGATIVE   Ketones, ur NEGATIVE  NEGATIVE mg/dL   Protein, ur NEGATIVE  NEGATIVE  mg/dL   Urobilinogen, UA 0.2  0.0 - 1.0 mg/dL   Nitrite NEGATIVE  NEGATIVE   Leukocytes, UA SMALL (*) NEGATIVE  URINE MICROSCOPIC-ADD ON     Status: Abnormal   Collection Time    04/09/13  8:40 AM      Result Value Range   Squamous Epithelial / LPF FEW (*) RARE   WBC, UA 3-6  <3 WBC/hpf  URINE RAPID DRUG SCREEN (HOSP PERFORMED)     Status: None   Collection Time    04/09/13  8:40 AM      Result Value Range   Opiates NONE DETECTED  NONE DETECTED   Cocaine NONE DETECTED  NONE DETECTED   Benzodiazepines NONE DETECTED  NONE DETECTED   Amphetamines NONE DETECTED  NONE DETECTED   Tetrahydrocannabinol NONE DETECTED  NONE DETECTED   Barbiturates NONE DETECTED  NONE DETECTED  POCT PREGNANCY, URINE     Status: Abnormal   Collection Time    04/09/13  8:52 AM      Result Value Range   Preg Test, Ur POSITIVE (*) NEGATIVE  CBC     Status: Abnormal   Collection Time    04/09/13  9:15 AM      Result Value Range   WBC 7.0  4.0 - 10.5 K/uL   RBC 4.20  3.87 - 5.11 MIL/uL   Hemoglobin 11.9 (*) 12.0 - 15.0 g/dL   HCT 16.1 (*) 09.6 - 04.5 %   MCV 81.4  78.0 - 100.0 fL   MCH 28.3  26.0 - 34.0 pg   MCHC 34.8  30.0 - 36.0 g/dL   RDW 40.9  81.1 - 91.4 %   Platelets 75 (*) 150 - 400 K/uL  COMPREHENSIVE METABOLIC PANEL     Status: Abnormal   Collection Time    04/09/13  9:15 AM      Result Value Range   Sodium 135  135 - 145 mEq/L   Potassium 3.3 (*) 3.5 - 5.1 mEq/L   Chloride 101  96 - 112 mEq/L   CO2 26  19 - 32 mEq/L   Glucose, Bld 83  70 - 99 mg/dL   BUN 9  6 - 23 mg/dL   Creatinine, Ser 7.82  0.50 - 1.10 mg/dL   Calcium 9.1  8.4 - 95.6 mg/dL   Total Protein 7.0  6.0 - 8.3 g/dL   Albumin 3.2 (*) 3.5 - 5.2 g/dL   AST 16  0 - 37 U/L   ALT 15  0 - 35 U/L   Alkaline Phosphatase 54  39 - 117 U/L   Total Bilirubin 0.1 (*) 0.3 - 1.2 mg/dL   GFR calc non Af Amer >90  >  90 mL/min   GFR calc Af Amer >90  >90 mL/min  WET PREP, GENITAL     Status: Abnormal   Collection Time    04/09/13 12:04  PM      Result Value Range   Yeast Wet Prep HPF POC NONE SEEN  NONE SEEN   Trich, Wet Prep NONE SEEN  NONE SEEN   Clue Cells Wet Prep HPF POC MODERATE (*) NONE SEEN   WBC, Wet Prep HPF POC MODERATE (*) NONE SEEN   US Ob Comp Less 14 Wks  04/09/2013  *RADIOLOGY REPORT*  Clinical Data: Pelvic and back pain.  OBSTETRIC <14 WK ULTRASOUND  Technique:  Transabdominal ultrasound was performed for evaluation of the gestation as well as the maternal uterus and adnexal regions.  Comparison:  Pelvic ultrasound 02/02/2012  Intrauterine gestational sac: Visualized/normal in shape. Yolk sac: Present Embryo: Present Cardiac Activity: Present Heart Rate: 179 bpm CRL:  30.3 mm  10 w  0 d            [place] EDC: 11/05/2013  Maternal uterus/Adnexae: There is a single intrauterine pregnancy.  Evidence for a small subchorionic hemorrhage.  Normal appearance of the right ovary, measuring 4.7 x 3.1 x 3.6 cm. There is a cystic structure in the right ovary that measures up to 3.6 cm.  Left ovary is not visualized.  IMPRESSION: Single live intrauterine pregnancy.  Calculated gestational age by ultrasound is 10 weeks and 0 days.   Original Report Authenticated By: Richarda Overlie, M.D.     Assessment and Plan  A:  SIUP at 10 weeks      Round ligament pain      Chronic ITP  P:  Discharge home      Reviewed findings Proof of Pregnancy  Referred to HR clinic Referred to Hematology for followup  [facility] 04/09/2013, 9:52 AM

## 2013-04-09 NOTE — MAU Note (Signed)
Pt presents with complaints of feeling awful for the last month. States she has had a fever on and off for 3 and 4 days a week for a month or even longer. States she is having back and pelvic pain. Vomiting every day or nauseated everyday.

## 2013-04-10 LAB — GC/CHLAMYDIA PROBE AMP
CT Probe RNA: NEGATIVE
GC Probe RNA: NEGATIVE

## 2013-04-10 LAB — URINE CULTURE
Colony Count: NO GROWTH
Culture: NO GROWTH

## 2013-04-11 NOTE — MAU Provider Note (Signed)
Attestation of Attending Supervision of Advanced Practitioner: Evaluation and management procedures were performed by the PA/NP/CNM/OB Fellow under my supervision/collaboration. Chart reviewed and agree with management and plan.  Season Astacio V 04/11/2013 11:15 AM

## 2013-04-13 ENCOUNTER — Encounter: Payer: Self-pay | Admitting: Obstetrics & Gynecology

## 2013-04-20 ENCOUNTER — Telehealth: Payer: Self-pay | Admitting: *Deleted

## 2013-04-20 NOTE — Telephone Encounter (Signed)
Called [place] home number and heard busy signal- unable to leave message. Called mobile number - heard message phone has been disconnected or changed. Called work number and told she is not there.  [facility]- need to attempt to call patient again to notify her of referral and that they will call her with appointment

## 2013-04-20 NOTE — Telephone Encounter (Signed)
Message copied by Gerome Apley on Thu Apr 20, 2013 11:36 AM ------      Message from: [employer] [employer]      Created: Sun Apr 09, 2013 12:08 PM      Regarding: New OB pt       Admin:  This is a former High Risk  Pt of ours                    Needs  High Risk New OB slot                    Is 10 weeks now                   I told her it would be a few weeks            Clin:  Needs re-consult with Hematology               Has long term ITP now with platelets 75K ------

## 2013-04-24 ENCOUNTER — Encounter: Payer: Self-pay | Admitting: *Deleted

## 2013-04-24 NOTE — Telephone Encounter (Signed)
Called [place] and unable to leave message- heard multiple rings but no answering machine picked up. Will send letter.

## 2013-05-01 ENCOUNTER — Encounter: Payer: Self-pay | Admitting: Obstetrics & Gynecology

## 2013-05-15 ENCOUNTER — Ambulatory Visit (INDEPENDENT_AMBULATORY_CARE_PROVIDER_SITE_OTHER): Payer: Medicaid Other | Admitting: Obstetrics and Gynecology

## 2013-05-15 ENCOUNTER — Encounter: Payer: Self-pay | Admitting: Obstetrics and Gynecology

## 2013-05-15 VITALS — BP 120/77 | Wt 209.1 lb

## 2013-05-15 DIAGNOSIS — E669 Obesity, unspecified: Secondary | ICD-10-CM | POA: Insufficient documentation

## 2013-05-15 DIAGNOSIS — O99210 Obesity complicating pregnancy, unspecified trimester: Secondary | ICD-10-CM | POA: Insufficient documentation

## 2013-05-15 DIAGNOSIS — O0940 Supervision of pregnancy with grand multiparity, unspecified trimester: Secondary | ICD-10-CM | POA: Insufficient documentation

## 2013-05-15 DIAGNOSIS — O09899 Supervision of other high risk pregnancies, unspecified trimester: Secondary | ICD-10-CM | POA: Insufficient documentation

## 2013-05-15 DIAGNOSIS — D693 Immune thrombocytopenic purpura: Secondary | ICD-10-CM

## 2013-05-15 DIAGNOSIS — Z302 Encounter for sterilization: Secondary | ICD-10-CM | POA: Insufficient documentation

## 2013-05-15 DIAGNOSIS — O0942 Supervision of pregnancy with grand multiparity, second trimester: Secondary | ICD-10-CM

## 2013-05-15 DIAGNOSIS — O09892 Supervision of other high risk pregnancies, second trimester: Secondary | ICD-10-CM

## 2013-05-15 LAB — POCT URINALYSIS DIP (DEVICE)
Bilirubin Urine: NEGATIVE
Glucose, UA: NEGATIVE mg/dL
Glucose, UA: NEGATIVE mg/dL
Leukocytes, UA: NEGATIVE
Nitrite: NEGATIVE
Nitrite: NEGATIVE
Specific Gravity, Urine: >=1.03 (ref 1.005–1.030)
Urobilinogen, UA: 1 mg/dL (ref 0.0–1.0)

## 2013-05-15 LAB — OB RESULTS CONSOLE GC/CHLAMYDIA
Chlamydia: NEGATIVE
Gonorrhea: NEGATIVE

## 2013-05-15 NOTE — Patient Instructions (Signed)
Pregnancy - Second Trimester The second trimester of pregnancy (3 to 6 months) is a period of rapid growth for you and your baby. At the end of the sixth month, your baby is about 9 inches long and weighs 1 1/2 pounds. You will begin to feel the baby move between 18 and 20 weeks of the pregnancy. This is called quickening. Weight gain is faster. A clear fluid (colostrum) may leak out of your breasts. You may feel small contractions of the womb (uterus). This is known as false labor or Braxton-Hicks contractions. This is like a practice for labor when the baby is ready to be born. Usually, the problems with morning sickness have usually passed by the end of your first trimester. Some women develop small dark blotches (called cholasma, mask of pregnancy) on their face that usually goes away after the baby is born. Exposure to the sun makes the blotches worse. Acne may also develop in some pregnant women and pregnant women who have acne, may find that it goes away. PRENATAL EXAMS  Blood work may continue to be done during prenatal exams. These tests are done to check on your health and the probable health of your baby. Blood work is used to follow your blood levels (hemoglobin). Anemia (low hemoglobin) is common during pregnancy. Iron and vitamins are given to help prevent this. You will also be checked for diabetes between 24 and 28 weeks of the pregnancy. Some of the previous blood tests may be repeated.  The size of the uterus is measured during each visit. This is to make sure that the baby is continuing to grow properly according to the dates of the pregnancy.  Your blood pressure is checked every prenatal visit. This is to make sure you are not getting toxemia.  Your urine is checked to make sure you do not have an infection, diabetes or protein in the urine.  Your weight is checked often to make sure gains are happening at the suggested rate. This is to ensure that both you and your baby are  growing normally.  Sometimes, an ultrasound is performed to confirm the proper growth and development of the baby. This is a test which bounces harmless sound waves off the baby so your caregiver can more accurately determine due dates. Sometimes, a specialized test is done on the amniotic fluid surrounding the baby. This test is called an amniocentesis. The amniotic fluid is obtained by sticking a needle into the belly (abdomen). This is done to check the chromosomes in instances where there is a concern about possible genetic problems with the baby. It is also sometimes done near the end of pregnancy if an early delivery is required. In this case, it is done to help make sure the baby's lungs are mature enough for the baby to live outside of the womb. CHANGES OCCURING IN THE SECOND TRIMESTER OF PREGNANCY Your body goes through many changes during pregnancy. They vary from person to person. Talk to your caregiver about changes you notice that you are concerned about.  During the second trimester, you will likely have an increase in your appetite. It is normal to have cravings for certain foods. This varies from person to person and pregnancy to pregnancy.  Your lower abdomen will begin to bulge.  You may have to urinate more often because the uterus and baby are pressing on your bladder. It is also common to get more bladder infections during pregnancy (pain with urination). You can help this by   drinking lots of fluids and emptying your bladder before and after intercourse.  You may begin to get stretch marks on your hips, abdomen, and breasts. These are normal changes in the body during pregnancy. There are no exercises or medications to take that prevent this change.  You may begin to develop swollen and bulging veins (varicose veins) in your legs. Wearing support hose, elevating your feet for 15 minutes, 3 to 4 times a day and limiting salt in your diet helps lessen the problem.  Heartburn may  develop as the uterus grows and pushes up against the stomach. Antacids recommended by your caregiver helps with this problem. Also, eating smaller meals 4 to 5 times a day helps.  Constipation can be treated with a stool softener or adding bulk to your diet. Drinking lots of fluids, vegetables, fruits, and whole grains are helpful.  Exercising is also helpful. If you have been very active up until your pregnancy, most of these activities can be continued during your pregnancy. If you have been less active, it is helpful to start an exercise program such as walking.  Hemorrhoids (varicose veins in the rectum) may develop at the end of the second trimester. Warm sitz baths and hemorrhoid cream recommended by your caregiver helps hemorrhoid problems.  Backaches may develop during this time of your pregnancy. Avoid heavy lifting, wear low heal shoes and practice good posture to help with backache problems.  Some pregnant women develop tingling and numbness of their hand and fingers because of swelling and tightening of ligaments in the wrist (carpel tunnel syndrome). This goes away after the baby is born.  As your breasts enlarge, you may have to get a bigger bra. Get a comfortable, cotton, support bra. Do not get a nursing bra until the last month of the pregnancy if you will be nursing the baby.  You may get a dark line from your belly button to the pubic area called the linea nigra.  You may develop rosy cheeks because of increase blood flow to the face.  You may develop spider looking lines of the face, neck, arms and chest. These go away after the baby is born. HOME CARE INSTRUCTIONS   It is extremely important to avoid all smoking, herbs, alcohol, and unprescribed drugs during your pregnancy. These chemicals affect the formation and growth of the baby. Avoid these chemicals throughout the pregnancy to ensure the delivery of a healthy infant.  Most of your home care instructions are the same  as suggested for the first trimester of your pregnancy. Keep your caregiver's appointments. Follow your caregiver's instructions regarding medication use, exercise and diet.  During pregnancy, you are providing food for you and your baby. Continue to eat regular, well-balanced meals. Choose foods such as meat, fish, milk and other low fat dairy products, vegetables, fruits, and whole-grain breads and cereals. Your caregiver will tell you of the ideal weight gain.  A physical sexual relationship may be continued up until near the end of pregnancy if there are no other problems. Problems could include early (premature) leaking of amniotic fluid from the membranes, vaginal bleeding, abdominal pain, or other medical or pregnancy problems.  Exercise regularly if there are no restrictions. Check with your caregiver if you are unsure of the safety of some of your exercises. The greatest weight gain will occur in the last 2 trimesters of pregnancy. Exercise will help you:  Control your weight.  Get you in shape for labor and delivery.  Lose weight   after you have the baby.  Wear a good support or jogging bra for breast tenderness during pregnancy. This may help if worn during sleep. Pads or tissues may be used in the bra if you are leaking colostrum.  Do not use hot tubs, steam rooms or saunas throughout the pregnancy.  Wear your seat belt at all times when driving. This protects you and your baby if you are in an accident.  Avoid raw meat, uncooked cheese, cat litter boxes and soil used by cats. These carry germs that can cause birth defects in the baby.  The second trimester is also a good time to visit your dentist for your dental health if this has not been done yet. Getting your teeth cleaned is OK. Use a soft toothbrush. Brush gently during pregnancy.  It is easier to loose urine during pregnancy. Tightening up and strengthening the pelvic muscles will help with this problem. Practice stopping  your urination while you are going to the bathroom. These are the same muscles you need to strengthen. It is also the muscles you would use as if you were trying to stop from passing gas. You can practice tightening these muscles up 10 times a set and repeating this about 3 times per day. Once you know what muscles to tighten up, do not perform these exercises during urination. It is more likely to contribute to an infection by backing up the urine.  Ask for help if you have financial, counseling or nutritional needs during pregnancy. Your caregiver will be able to offer counseling for these needs as well as refer you for other special needs.  Your skin may become oily. If so, wash your face with mild soap, use non-greasy moisturizer and oil or cream based makeup. MEDICATIONS AND DRUG USE IN PREGNANCY  Take prenatal vitamins as directed. The vitamin should contain 1 milligram of folic acid. Keep all vitamins out of reach of children. Only a couple vitamins or tablets containing iron may be fatal to a baby or young child when ingested.  Avoid use of all medications, including herbs, over-the-counter medications, not prescribed or suggested by your caregiver. Only take over-the-counter or prescription medicines for pain, discomfort, or fever as directed by your caregiver. Do not use aspirin.  Let your caregiver also know about herbs you may be using.  Alcohol is related to a number of birth defects. This includes fetal alcohol syndrome. All alcohol, in any form, should be avoided completely. Smoking will cause low birth rate and premature babies.  Street or illegal drugs are very harmful to the baby. They are absolutely forbidden. A baby born to an addicted mother will be addicted at birth. The baby will go through the same withdrawal an adult does. SEEK MEDICAL CARE IF:  You have any concerns or worries during your pregnancy. It is better to call with your questions if you feel they cannot wait,  rather than worry about them. SEEK IMMEDIATE MEDICAL CARE IF:   An unexplained oral temperature above 100.4 F (38 C) develops, or as your caregiver suggests.  You have leaking of fluid from the vagina (birth canal). If leaking membranes are suspected, take your temperature and tell your caregiver of this when you call.  There is vaginal spotting, bleeding, or passing clots. Tell your caregiver of the amount and how many pads are used. Light spotting in pregnancy is common, especially following intercourse.  You develop a bad smelling vaginal discharge with a change in the color from clear   to white.  You continue to feel sick to your stomach (nauseated) and have no relief from remedies suggested. You vomit blood or coffee ground-like materials.  You lose more than 2 pounds of weight or gain more than 2 pounds of weight over 1 week, or as suggested by your caregiver.  You notice swelling of your face, hands, feet, or legs.  You get exposed to German measles and have never had them.  You are exposed to fifth disease or chickenpox.  You develop belly (abdominal) pain. Round ligament discomfort is a common non-cancerous (benign) cause of abdominal pain in pregnancy. Your caregiver still must evaluate you.  You develop a bad headache that does not go away.  You develop fever, diarrhea, pain with urination, or shortness of breath.  You develop visual problems, blurry, or double vision.  You fall or are in a car accident or any kind of trauma.  There is mental or physical violence at home. Document Released: 12/08/2001 Document Revised: 03/07/2012 Document Reviewed: 06/12/2009 [employer] Patient Information 2013 [employer].  Contraception Choices Contraception (birth control) is the use of any methods or devices to prevent pregnancy. Below are some methods to help avoid pregnancy. HORMONAL METHODS   Contraceptive implant. This is a thin, plastic tube containing progesterone  hormone. It does not contain estrogen hormone. Your caregiver inserts the tube in the inner part of the upper arm. The tube can remain in place for up to 3 years. After 3 years, the implant must be removed. The implant prevents the ovaries from releasing an egg (ovulation), thickens the cervical mucus which prevents sperm from entering the uterus, and thins the lining of the inside of the uterus.  Progesterone-only injections. These injections are given every 3 months by your caregiver to prevent pregnancy. This synthetic progesterone hormone stops the ovaries from releasing eggs. It also thickens cervical mucus and changes the uterine lining. This makes it harder for sperm to survive in the uterus.  Birth control pills. These pills contain estrogen and progesterone hormone. They work by stopping the egg from forming in the ovary (ovulation). Birth control pills are prescribed by a caregiver.Birth control pills can also be used to treat heavy periods.  Minipill. This type of birth control pill contains only the progesterone hormone. They are taken every day of each month and must be prescribed by your caregiver.  Birth control patch. The patch contains hormones similar to those in birth control pills. It must be changed once a week and is prescribed by a caregiver.  Vaginal ring. The ring contains hormones similar to those in birth control pills. It is left in the vagina for 3 weeks, removed for 1 week, and then a new one is put back in place. The patient must be comfortable inserting and removing the ring from the vagina.A caregiver's prescription is necessary.  Emergency contraception. Emergency contraceptives prevent pregnancy after unprotected sexual intercourse. This pill can be taken right after sex or up to 5 days after unprotected sex. It is most effective the sooner you take the pills after having sexual intercourse. Emergency contraceptive pills are available without a prescription. Check  with your pharmacist. Do not use emergency contraception as your only form of birth control. BARRIER METHODS   Female condom. This is a thin sheath (latex or rubber) that is worn over the penis during sexual intercourse. It can be used with spermicide to increase effectiveness.  Female condom. This is a soft, loose-fitting sheath that is put into the   vagina before sexual intercourse.  Diaphragm. This is a soft, latex, dome-shaped barrier that must be fitted by a caregiver. It is inserted into the vagina, along with a spermicidal jelly. It is inserted before intercourse. The diaphragm should be left in the vagina for 6 to 8 hours after intercourse.  Cervical cap. This is a round, soft, latex or plastic cup that fits over the cervix and must be fitted by a caregiver. The cap can be left in place for up to 48 hours after intercourse.  Sponge. This is a soft, circular piece of polyurethane foam. The sponge has spermicide in it. It is inserted into the vagina after wetting it and before sexual intercourse.  Spermicides. These are chemicals that kill or block sperm from entering the cervix and uterus. They come in the form of creams, jellies, suppositories, foam, or tablets. They do not require a prescription. They are inserted into the vagina with an applicator before having sexual intercourse. The process must be repeated every time you have sexual intercourse. INTRAUTERINE CONTRACEPTION  Intrauterine device (IUD). This is a T-shaped device that is put in a woman's uterus during a menstrual period to prevent pregnancy. There are 2 types:  Copper IUD. This type of IUD is wrapped in copper wire and is placed inside the uterus. Copper makes the uterus and fallopian tubes produce a fluid that kills sperm. It can stay in place for 10 years.  Hormone IUD. This type of IUD contains the hormone progestin (synthetic progesterone). The hormone thickens the cervical mucus and prevents sperm from entering the  uterus, and it also thins the uterine lining to prevent implantation of a fertilized egg. The hormone can weaken or kill the sperm that get into the uterus. It can stay in place for 5 years. PERMANENT METHODS OF CONTRACEPTION  Female tubal ligation. This is when the woman's fallopian tubes are surgically sealed, tied, or blocked to prevent the egg from traveling to the uterus.  Female sterilization. This is when the female has the tubes that carry sperm tied off (vasectomy).This blocks sperm from entering the vagina during sexual intercourse. After the procedure, the man can still ejaculate fluid (semen). NATURAL PLANNING METHODS  Natural family planning. This is not having sexual intercourse or using a barrier method (condom, diaphragm, cervical cap) on days the woman could become pregnant.  Calendar method. This is keeping track of the length of each menstrual cycle and identifying when you are fertile.  Ovulation method. This is avoiding sexual intercourse during ovulation.  Symptothermal method. This is avoiding sexual intercourse during ovulation, using a thermometer and ovulation symptoms.  Post-ovulation method. This is timing sexual intercourse after you have ovulated. Regardless of which type or method of contraception you choose, it is important that you use condoms to protect against the transmission of sexually transmitted diseases (STDs). Talk with your caregiver about which form of contraception is most appropriate for you. Document Released: 12/14/2005 Document Revised: 03/07/2012 Document Reviewed: 04/22/2011 [employer] Patient Information 2013 [employer].  Breastfeeding Deciding to breastfeed is one of the best choices you can make for you and your baby. The information that follows gives a brief overview of the benefits of breastfeeding as well as common topics surrounding breastfeeding. BENEFITS OF BREASTFEEDING For the baby  The first milk (colostrum) helps the baby's  digestive system function better.   There are antibodies in the mother's milk that help the baby fight off infections.   The baby has a lower incidence of asthma,   allergies, and sudden infant death syndrome (SIDS).   The nutrients in breast milk are better for the baby than infant formulas, and breast milk helps the baby's brain grow better.   Babies who breastfeed have less gas, colic, and constipation.  For the mother  Breastfeeding helps develop a very special bond between the mother and her baby.   Breastfeeding is convenient, always available at the correct temperature, and costs nothing.   Breastfeeding burns calories in the mother and helps her lose weight that was gained during pregnancy.   Breastfeeding makes the uterus contract back down to normal size faster and slows bleeding following delivery.   Breastfeeding mothers have a lower risk of developing breast cancer.  BREASTFEEDING FREQUENCY  A healthy, full-term baby may breastfeed as often as every hour or space his or her feedings to every 3 hours.   Watch your baby for signs of hunger. Nurse your baby if he or she shows signs of hunger. How often you nurse will vary from baby to baby.   Nurse as often as the baby requests, or when you feel the need to reduce the fullness of your breasts.   Awaken the baby if it has been 3 4 hours since the last feeding.   Frequent feeding will help the mother make more milk and will help prevent problems, such as sore nipples and engorgement of the breasts.  BABY'S POSITION AT THE BREAST  Whether lying down or sitting, be sure that the baby's tummy is facing your tummy.   Support the breast with 4 fingers underneath the breast and the thumb above. Make sure your fingers are well away from the nipple and baby's mouth.   Stroke the baby's lips gently with your finger or nipple.   When the baby's mouth is open wide enough, place all of your nipple and as much of the  areola as possible into your baby's mouth.   Pull the baby in close so the tip of the nose and the baby's cheeks touch the breast during the feeding.  FEEDINGS AND SUCTION  The length of each feeding varies from baby to baby and from feeding to feeding.   The baby must suck about 2 3 minutes for your milk to get to him or her. This is called a "let down." For this reason, allow the baby to feed on each breast as long as he or she wants. Your baby will end the feeding when he or she has received the right balance of nutrients.   To break the suction, put your finger into the corner of the baby's mouth and slide it between his or her gums before removing your breast from his or her mouth. This will help prevent sore nipples.  HOW TO TELL WHETHER YOUR BABY IS GETTING ENOUGH BREAST MILK. Wondering whether or not your baby is getting enough milk is a common concern among mothers. You can be assured that your baby is getting enough milk if:   Your baby is actively sucking and you hear swallowing.   Your baby seems relaxed and satisfied after a feeding.   Your baby nurses at least 8 12 times in a 24 hour time period. Nurse your baby until he or she unlatches or falls asleep at the first breast (at least 10 20 minutes), then offer the second side.   Your baby is wetting 5 6 disposable diapers (6 8 cloth diapers) in a 24 hour period by 5 6 days of age.     Your baby is having at least 3 4 stools every 24 hours for the first 6 weeks. The stool should be soft and yellow.   Your baby should gain 4 7 ounces per week after he or she is 4 days old.   Your breasts feel softer after nursing.  REDUCING BREAST ENGORGEMENT  In the first week after your baby is born, you may experience signs of breast engorgement. When breasts are engorged, they feel heavy, warm, full, and may be tender to the touch. You can reduce engorgement if you:   Nurse frequently, every 2 3 hours. Mothers who breastfeed  early and often have fewer problems with engorgement.   Place light ice packs on your breasts for 10 20 minutes between feedings. This reduces swelling. Wrap the ice packs in a lightweight towel to protect your skin. Bags of frozen vegetables work well for this purpose.   Take a warm shower or apply warm, moist heat to your breast for 5 10 minutes just before each feeding. This increases circulation and helps the milk flow.   Gently massage your breast before and during the feeding. Using your finger tips, massage from the chest wall towards your nipple in a circular motion.   Make sure that the baby empties at least one breast at every feeding before switching sides.   Use a breast pump to empty the breasts if your baby is sleepy or not nursing well. You may also want to pump if you are returning to work oryou feel you are getting engorged.   Avoid bottle feeds, pacifiers, or supplemental feedings of water or juice in place of breastfeeding. Breast milk is all the food your baby needs. It is not necessary for your baby to have water or formula. In fact, to help your breasts make more milk, it is best not to give your baby supplemental feedings during the early weeks.   Be sure the baby is latched on and positioned properly while breastfeeding.   Wear a supportive bra, avoiding underwire styles.   Eat a balanced diet with enough fluids.   Rest often, relax, and take your prenatal vitamins to prevent fatigue, stress, and anemia.  If you follow these suggestions, your engorgement should improve in 24 48 hours. If you are still experiencing difficulty, call your lactation consultant or caregiver.  CARING FOR YOURSELF Take care of your breasts  Bathe or shower daily.   Avoid using soap on your nipples.   Start feedings on your left breast at one feeding and on your right breast at the next feeding.   You will notice an increase in your milk supply 2 5 days after delivery.  You may feel some discomfort from engorgement, which makes your breasts very firm and often tender. Engorgement "peaks" out within 24 48 hours. In the meantime, apply warm moist towels to your breasts for 5 10 minutes before feeding. Gentle massage and expression of some milk before feeding will soften your breasts, making it easier for your baby to latch on.   Wear a well-fitting nursing bra, and air dry your nipples for a 3 4minutes after each feeding.   Only use cotton bra pads.   Only use pure lanolin on your nipples after nursing. You do not need to wash it off before feeding the baby again. Another option is to express a few drops of breast milk and gently massage it into your nipples.  Take care of yourself  Eat well-balanced meals and nutritious snacks.     Drinking milk, fruit juice, and water to satisfy your thirst (about 8 glasses a day).   Get plenty of rest.  Avoid foods that you notice affect the baby in a bad way.  SEEK MEDICAL CARE IF:   You have difficulty with breastfeeding and need help.   You have a hard, red, sore area on your breast that is accompanied by a fever.   Your baby is too sleepy to eat well or is having trouble sleeping.   Your baby is wetting less than 6 diapers a day, by 5 days of age.   Your baby's skin or white part of his or her eyes is more yellow than it was in the hospital.   You feel depressed.  Document Released: 12/14/2005 Document Revised: 06/14/2012 Document Reviewed: 03/13/2012 [employer] Patient Information 2013 [employer].  

## 2013-05-15 NOTE — Progress Notes (Signed)
Pulse: 83 Pain and numbness in her hands arms and legs. Also has pelvic pain and back pain.  Patient has to leave at 900 today will do early 1hr gtt at next visit.

## 2013-05-15 NOTE — Progress Notes (Signed)
Subjective:    [name] [name] is a [id number] [redacted]w[redacted]d being seen today for her first obstetrical visit.  Her obstetrical history is significant for obesity and grand-multiparous, and ITP. Patient is being followed by Hem/Onc. Patient does intend to breast feed. Pregnancy history fully reviewed. She desires permanent sterilization and is upset that it could not have been done after her last pregnancy secondary to thrombocytopenia.  Patient reports no complaints.  Filed Vitals:   05/15/13 0801  BP: 120/77  Weight: 209 lb 1.6 oz (94.847 kg)    HISTORY: OB History   Grav Para Term Preterm Abortions TAB SAB Ect Mult Living   11 6 6  0 4 2 1 1  6      # Outc Date GA Lbr Len/2nd Wgt Sex Del Anes PTL Lv   1 TRM 1/03 [redacted]w[redacted]d   M SVD   Yes   2 TRM 6/04 [redacted]w[redacted]d   M SVD   Yes   3 TRM 4/06 [redacted]w[redacted]d   M SVD   Yes   4 TRM 4/07 [redacted]w[redacted]d   M SVD   Yes   5 SAB 4/08 [redacted]w[redacted]d          6 TRM 3/09 [redacted]w[redacted]d   F SVD None  Yes   7 TAB 1/10 [redacted]w[redacted]d          8 TRM 11/11 [redacted]w[redacted]d  7lb4oz(3.289kg) F SVD None  Yes   9 CUR            10 TAB            11 ECT              Past Medical History  Diagnosis Date  . ITP (idiopathic thrombocytopenic purpura)   . ITP (idiopathic thrombocytopenic purpura)    Past Surgical History  Procedure Laterality Date  . Surgical repair to wrist     Family History  Problem Relation Age of Onset  . Cancer Father     leukemia  . Fibroids Mother   . Thrombocytopenia Brother     ITP     Exam    Uterus:     Pelvic Exam:    Perineum: Normal Perineum   Vulva: normal   Vagina:  normal mucosa, normal discharge   pH:    Cervix: closed and long   Adnexa: not evaluated   Bony Pelvis: android  System: Breast:  normal appearance, no masses or tenderness   Skin: normal coloration and turgor, no rashes    Neurologic: oriented, no focal deficits   Extremities: normal strength, tone, and muscle mass   HEENT extra ocular movement intact   Mouth/Teeth mucous membranes moist, pharynx normal  without lesions   Neck supple and no masses   Cardiovascular: regular rate and rhythm   Respiratory:  chest clear, no wheezing, crepitations, rhonchi, normal symmetric air entry   Abdomen: soft, gravid, NT, obese   Urinary:       Assessment:    Pregnancy: [id number] Patient Active Problem List   Diagnosis Date Noted  . Supervision of other high-risk pregnancy 05/15/2013  . desires sterilization 05/15/2013  . ITP (idiopathic thrombocytopenic purpura) 09/21/2011  . TOBACCO USER 09/10/2009        Plan:     Initial labs drawn. Prenatal vitamins. Problem list reviewed and updated. Genetic Screening discussed Quad Screen: requested. To be collected at next visit  Ultrasound discussed; fetal survey: requested. Will ensure patient has f/u appointment with hem/onc  Follow up in 3 weeks. 50%  of 30 min visit spent on counseling and coordination of care.     Yahye Siebert 05/15/2013

## 2013-05-16 LAB — OBSTETRIC PANEL
Basophils Relative: 0 % (ref 0–1)
Hemoglobin: 11.4 g/dL — ABNORMAL LOW (ref 12.0–15.0)
Hepatitis B Surface Ag: NEGATIVE
Lymphs Abs: 2 10*3/uL (ref 0.7–4.0)
MCHC: 34.7 g/dL (ref 30.0–36.0)
Monocytes Relative: 4 % (ref 3–12)
Neutro Abs: 4.7 10*3/uL (ref 1.7–7.7)
Neutrophils Relative %: 67 % (ref 43–77)
RBC: 4.06 MIL/uL (ref 3.87–5.11)
Rh Type: POSITIVE
WBC: 7 10*3/uL (ref 4.0–10.5)

## 2013-05-17 LAB — HEMOGLOBINOPATHY EVALUATION
Hemoglobin Other: 0 %
Hgb A2 Quant: 3.3 % — ABNORMAL HIGH (ref 2.2–3.2)
Hgb F Quant: 0 % (ref 0.0–2.0)

## 2013-05-19 ENCOUNTER — Encounter: Payer: Self-pay | Admitting: Obstetrics & Gynecology

## 2013-06-02 ENCOUNTER — Telehealth: Payer: Self-pay | Admitting: Internal Medicine

## 2013-06-02 NOTE — Telephone Encounter (Signed)
S/W PT IN RE TO APPT ON 6/16 @ 3:15 W/DR. MOHAMED.  CALENDAR MAILED.

## 2013-06-05 ENCOUNTER — Ambulatory Visit (INDEPENDENT_AMBULATORY_CARE_PROVIDER_SITE_OTHER): Payer: Medicaid Other | Admitting: Family Medicine

## 2013-06-05 VITALS — BP 142/89 | Temp 97.7°F | Wt 212.0 lb

## 2013-06-05 DIAGNOSIS — O139 Gestational [pregnancy-induced] hypertension without significant proteinuria, unspecified trimester: Secondary | ICD-10-CM

## 2013-06-05 DIAGNOSIS — O132 Gestational [pregnancy-induced] hypertension without significant proteinuria, second trimester: Secondary | ICD-10-CM

## 2013-06-05 DIAGNOSIS — O0942 Supervision of pregnancy with grand multiparity, second trimester: Secondary | ICD-10-CM

## 2013-06-05 DIAGNOSIS — G43909 Migraine, unspecified, not intractable, without status migrainosus: Secondary | ICD-10-CM

## 2013-06-05 LAB — POCT URINALYSIS DIP (DEVICE)
Glucose, UA: NEGATIVE mg/dL
Nitrite: NEGATIVE
Urobilinogen, UA: 2 mg/dL — ABNORMAL HIGH (ref 0.0–1.0)

## 2013-06-05 LAB — COMPREHENSIVE METABOLIC PANEL
AST: 15 U/L (ref 0–37)
Albumin: 3.4 g/dL — ABNORMAL LOW (ref 3.5–5.2)
Alkaline Phosphatase: 52 U/L (ref 39–117)
Potassium: 3.5 mEq/L (ref 3.5–5.3)
Sodium: 136 mEq/L (ref 135–145)
Total Protein: 6.4 g/dL (ref 6.0–8.3)

## 2013-06-05 MED ORDER — BUTALBITAL-APAP-CAFFEINE 50-500-40 MG PO TABS: 1.0000 | ORAL_TABLET | ORAL | Status: DC | PRN

## 2013-06-05 NOTE — Progress Notes (Signed)
P=96, c/o neck pain, and leg pain that she has had for years, pelvic pain since last year and not changed during pregnancy. States has been having migraines everyday and using bc powders because tylenol not working.

## 2013-06-05 NOTE — Progress Notes (Signed)
Nutrition Note:  1st visit Wt. Loss of 4#.  Pt reports wt is increasing.  Previous N/V.  Decreased appetite.  PNV daily.  Drinking Ensure instead of eating meals.  Drinks whole milk and water.   Discussed importance of 3 meals and 2-3 snacks/d Discussed wt gain goals of 11-20# Client not interested in receiving WIC. F/U in 2-4 weeks. Candice C. Earlene Plater, MPH, RD, LDN

## 2013-06-05 NOTE — Progress Notes (Signed)
Migraines daily. Tylenol not working. Tried tramadol and vicodin but made her throw up. Using BC powder but only works temporarily. Headache accompanied by blurry vision, Photo- and phonosensitivity. Pt very fatigued. Sleeps all the time. ALso has involuntary foot/leg movement during sleep. No contractions but always feels crampy. No bleeding or LOF. Will try fioricet for headaches. BP elevated again today. Will get labs - CMP, CBC, baseline 24 hour urine protein.

## 2013-06-05 NOTE — Patient Instructions (Addendum)
Hypertension During Pregnancy Hypertension is also called high blood pressure. It can occur at any time in life and during pregnancy. When you have hypertension, there is extra pressure inside your blood vessels that carry blood from the heart to the rest of your body (arteries). Hypertension during pregnancy can cause problems for you and your baby. Your baby might not weigh as much as it should at birth or might be born early (premature). Very bad cases of hypertension during pregnancy can be life-threatening.  There are different types of hypertension during pregnancy.   Chronic hypertension. This happens when a woman has hypertension before pregnancy and it continues during pregnancy.  Gestational hypertension. This is when hypertension develops during pregnancy.  Preeclampsia or toxemia of pregnancy. This is a very serious type of hypertension that develops only during pregnancy. It is a disease that affects the whole body (systemic) and can be very dangerous for both mother and baby.  Gestational hypertension and preeclampsia usually go away after your baby is born. Blood pressure generally stabilizes within 6 weeks. Women who have hypertension during pregnancy have a greater chance of developing hypertension later in life or with future pregnancies. UNDERSTANDING BLOOD PRESSURE Blood pressure moves blood in your body. Sometimes, the force that moves the blood becomes too strong.  A blood pressure reading is given in 2 numbers and looks like a fraction.  The top number is called the systolic pressure. When your heart beats, it forces more blood to flow through the arteries. Pressure inside the arteries goes up.  The bottom number is the diastolic pressure. Pressure goes down between beats. That is when the heart is resting.  You may have hypertension if:  Your systolic blood pressure is above 140.  Your diastolic pressure is above 90. RISK FACTORS Some factors make you more likely to  develop hypertension during pregnancy. Risk factors include:  Having hypertension before pregnancy.  Having hypertension during a previous pregnancy.  Being overweight.  Being older than 40.  Being pregnant with more than 1 baby (multiples).  Having diabetes or kidney problems. SYMPTOMS Chronic and gestational hypertension may not cause symptoms. Preeclampsia has symptoms, which may include:  Increased protein in your urine. Your caregiver will check for this at every prenatal visit.  Swelling of your hands and face.  Rapid weight gain.  Headaches.  Visual changes.  Being bothered by light.  Abdominal pain, especially in the right upper area.  Chest pain.  Shortness of breath.  Increased reflexes.  Seizures. Seizures occur with a more severe form of preeclampsia, called eclampsia. DIAGNOSIS   You may be diagnosed with hypertension during pregnancy during a regular prenatal exam. At each visit, tests may include:  Blood pressure checks.  A urine test to check for protein in your urine.  The type of hypertension you are diagnosed with depends on when you developed it. It also depends on your specific blood pressure reading.  Developing hypertension before 20 weeks of pregnancy is consistent with chronic hypertension.  Developing hypertension after 20 weeks of pregnancy is consistent with gestational hypertension.  Hypertension with increased urinary protein is diagnosed as preeclampsia.  Blood pressure measurements that stay above 160 systolic or 110 diastolic are a sign of severe preeclampsia. TREATMENT Treatment for hypertension during pregnancy varies. Treatment depends on the type of hypertension and how serious it is.  If you take medicine for chronic hypertension, you may need to switch medicines.  Drugs called ACE inhibitors should not be taken during pregnancy.    Low-dose aspirin may be suggested for women who have risk factors for preeclampsia.  If  you have gestational hypertension, you may need to take a blood pressure medicine that is safe during pregnancy. Your caregiver will recommend the appropriate medicine.  If you have severe preeclampsia, you may need to be in the hospital. Caregivers will watch you and the baby very closely. You also may need to take medicine (magnesium sulfate) to prevent seizures and lower blood pressure.  Sometimes an early delivery is needed. This may be the case if the condition worsens. It would be done to protect you and the baby. The only cure for preeclampsia is delivery. HOME CARE INSTRUCTIONS  Schedule and keep all of your regular prenatal care.  Follow your caregiver's instructions for taking medicines. Tell your caregiver about all medicines you take. This includes over-the-counter medicines.  Eat as little salt as possible.  Get regular exercise.  Do not drink alcohol.  Do not use tobacco products.  Do not drink products with caffeine.  Lie on your left side when resting.  Tell your doctor if you have any preeclampsia symptoms. SEEK IMMEDIATE MEDICAL CARE IF:  You have severe abdominal pain.  You have sudden swelling in the hands, ankles, or face.  You gain 4 pounds (1.8 kg) or more in 1 week.  You vomit repeatedly.  You have vaginal bleeding.  You do not feel the baby moving as much.  You have a headache.  You have blurred or double vision.  You have muscle twitching or spasms.  You have shortness of breath.  You have blue fingernails and lips.  You have blood in your urine. MAKE SURE YOU:  Understand these instructions.  Will watch your condition.  Will get help right away if you are not doing well. Document Released: 09/01/2011 Document Revised: 03/07/2012 Document Reviewed: 09/01/2011 [facility] Patient Information 2014 [place], [place].  Migraine Headache A migraine headache is an intense, throbbing pain on one or both sides of your head. A migraine can last  for 30 minutes to several hours. CAUSES  The exact cause of a migraine headache is not always known. However, a migraine may be caused when nerves in the brain become irritated and release chemicals that cause inflammation. This causes pain. SYMPTOMS  Pain on one or both sides of your head.  Pulsating or throbbing pain.  Severe pain that prevents daily activities.  Pain that is aggravated by any physical activity.  Nausea, vomiting, or both.  Dizziness.  Pain with exposure to bright lights, loud noises, or activity.  General sensitivity to bright lights, loud noises, or smells. Before you get a migraine, you may get warning signs that a migraine is coming (aura). An aura may include:  Seeing flashing lights.  Seeing bright spots, halos, or zig-zag lines.  Having tunnel vision or blurred vision.  Having feelings of numbness or tingling.  Having trouble talking.  Having muscle weakness. MIGRAINE TRIGGERS  Alcohol.  Smoking.  Stress.  Menstruation.  Aged cheeses.  Foods or drinks that contain nitrates, glutamate, aspartame, or tyramine.  Lack of sleep.  Chocolate.  Caffeine.  Hunger.  Physical exertion.  Fatigue.  Medicines used to treat chest pain (nitroglycerine), birth control pills, estrogen, and some blood pressure medicines. DIAGNOSIS  A migraine headache is often diagnosed based on:  Symptoms.  Physical examination.  A CT scan or MRI of your head. TREATMENT Medicines may be given for pain and nausea. Medicines can also be given to help prevent  recurrent migraines.  HOME CARE INSTRUCTIONS  Only take over-the-counter or prescription medicines for pain or discomfort as directed by your caregiver. The use of long-term narcotics is not recommended.  Lie down in a dark, quiet room when you have a migraine.  Keep a journal to find out what may trigger your migraine headaches. For example, write down:  What you eat and drink.  How much sleep  you get.  Any change to your diet or medicines.  Limit alcohol consumption.  Quit smoking if you smoke.  Get 7 to 9 hours of sleep, or as recommended by your caregiver.  Limit stress.  Keep lights dim if bright lights bother you and make your migraines worse. SEEK IMMEDIATE MEDICAL CARE IF:   Your migraine becomes severe.  You have a fever.  You have a stiff neck.  You have vision loss.  You have muscular weakness or loss of muscle control.  You start losing your balance or have trouble walking.  You feel faint or pass out.  You have severe symptoms that are different from your first symptoms. MAKE SURE YOU:   Understand these instructions.  Will watch your condition.  Will get help right away if you are not doing well or get worse. Document Released: 12/14/2005 Document Revised: 03/07/2012 Document Reviewed: 12/04/2011 [facility] Patient Information 2014 [place], [place].

## 2013-06-05 NOTE — Progress Notes (Signed)
U/S scheduled 06/06/13 at 2 pm.

## 2013-06-06 ENCOUNTER — Ambulatory Visit (HOSPITAL_COMMUNITY)
Admission: RE | Admit: 2013-06-06 | Discharge: 2013-06-06 | Disposition: A | Discharge status: Home / Self Care | Payer: Medicaid Other | Source: Ambulatory Visit | Attending: Family Medicine | Admitting: Family Medicine

## 2013-06-06 DIAGNOSIS — O132 Gestational [pregnancy-induced] hypertension without significant proteinuria, second trimester: Secondary | ICD-10-CM

## 2013-06-06 DIAGNOSIS — Z3689 Encounter for other specified antenatal screening: Secondary | ICD-10-CM | POA: Insufficient documentation

## 2013-06-06 DIAGNOSIS — O139 Gestational [pregnancy-induced] hypertension without significant proteinuria, unspecified trimester: Secondary | ICD-10-CM | POA: Insufficient documentation

## 2013-06-06 LAB — CBC
Hemoglobin: 11.4 g/dL — ABNORMAL LOW (ref 12.0–15.0)
MCH: 28.6 pg (ref 26.0–34.0)
MCHC: 35.2 g/dL (ref 30.0–36.0)
MCV: 81.4 fL (ref 78.0–100.0)
Platelets: 80 10*3/uL — ABNORMAL LOW (ref 150–400)

## 2013-06-06 IMAGING — US US OB COMP +14 WK
1 series · 12 of 28 positions shown · non-contrast
Comparison: none

[Series 1: us ob comp +14 wk · 12 of 78 slices shown]
[im 3/78]
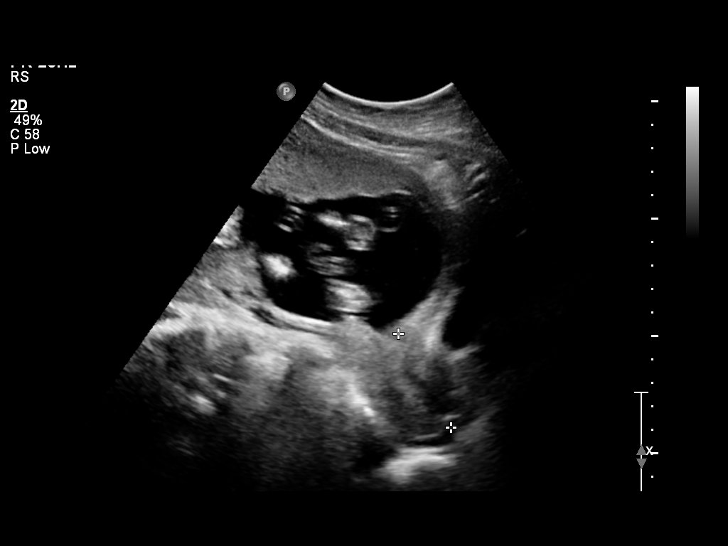
[im 9/78]
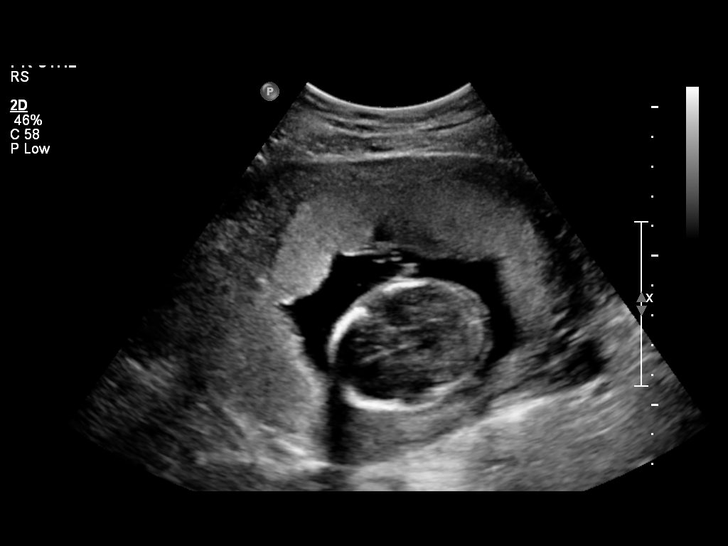
[im 15/78]
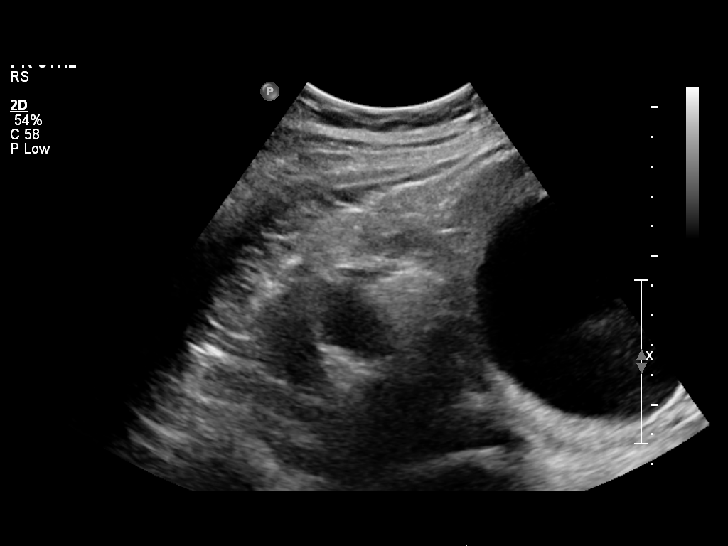
[im 23/78]
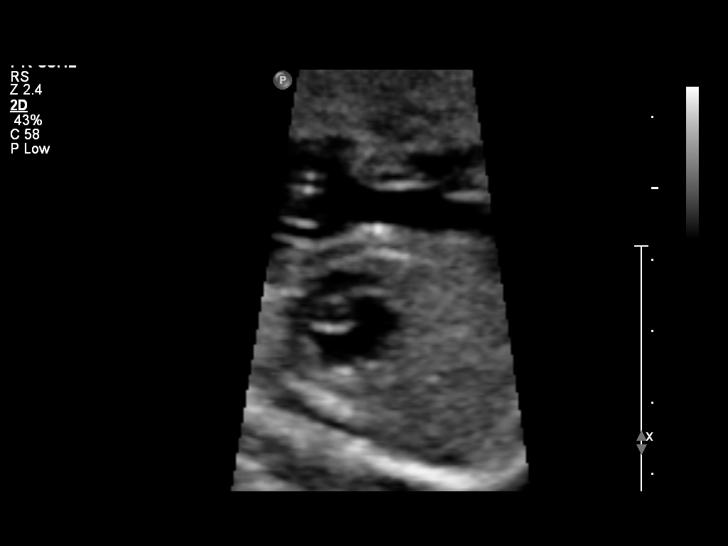
[im 29/78]
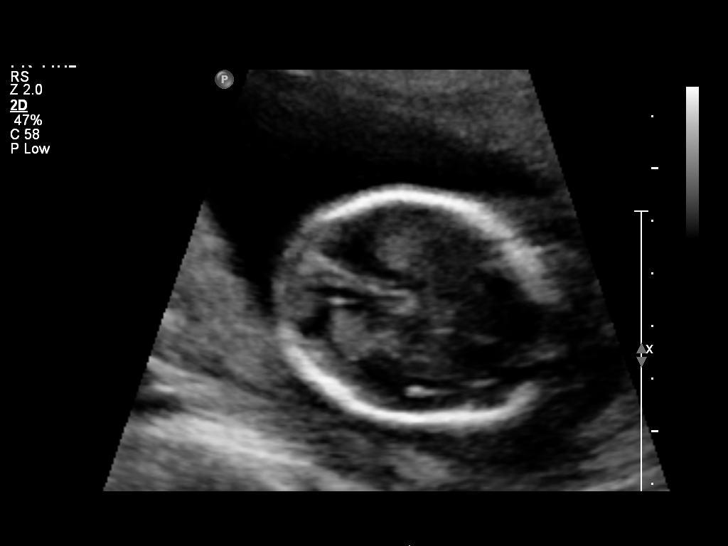
[im 35/78]
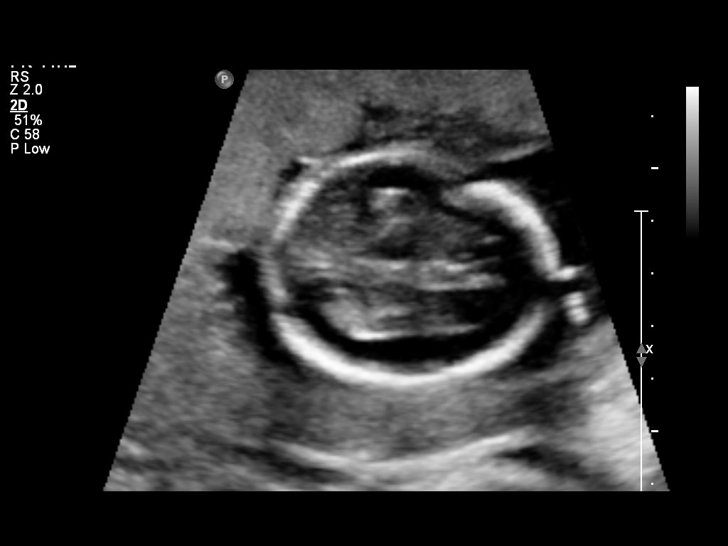
[im 43/78]
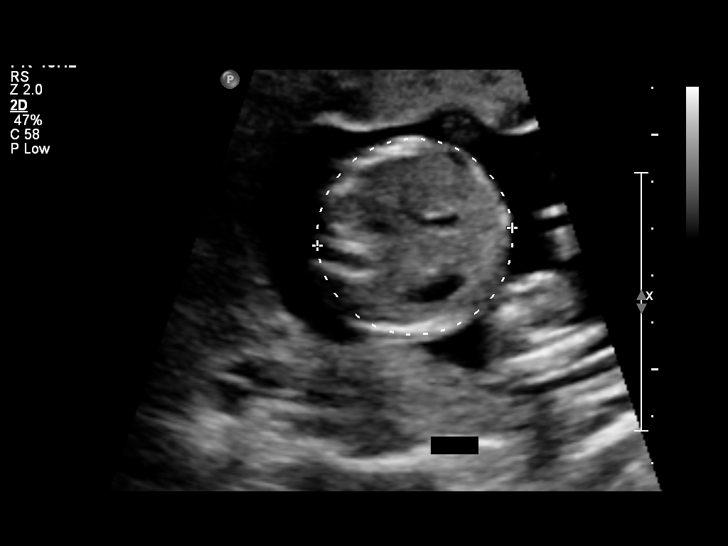
[im 49/78]
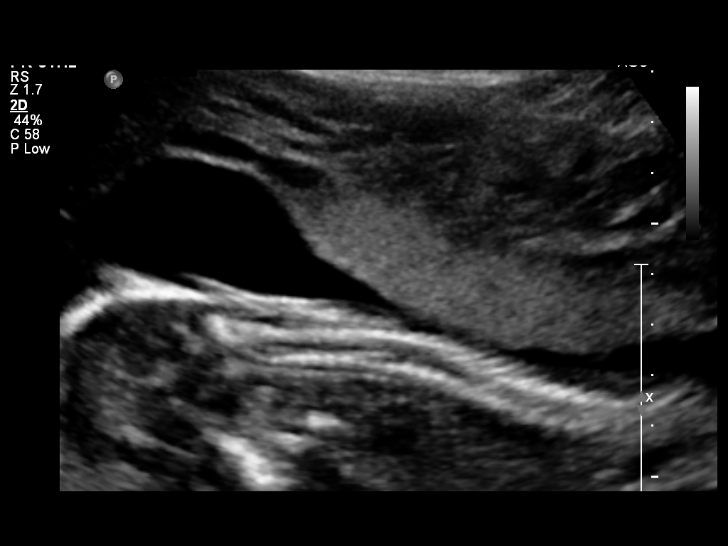
[im 55/78]
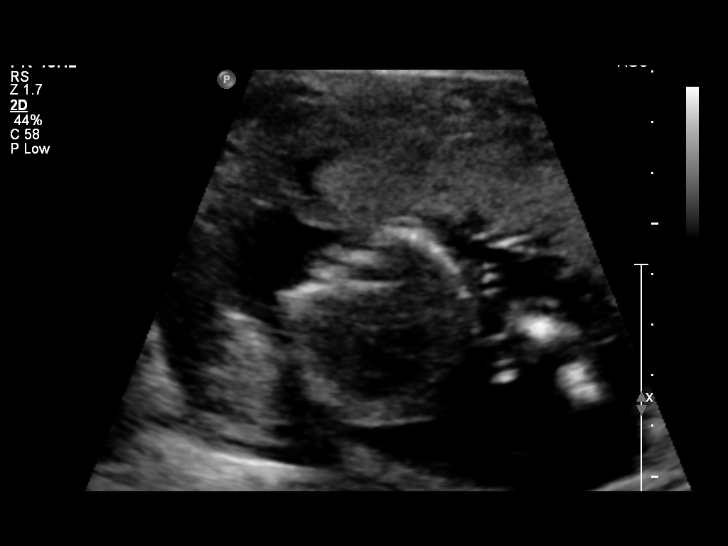
[im 63/78]
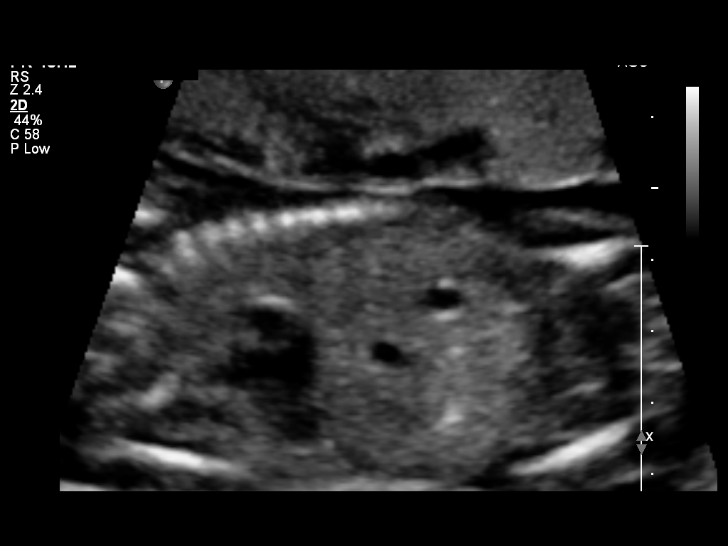
[im 69/78]
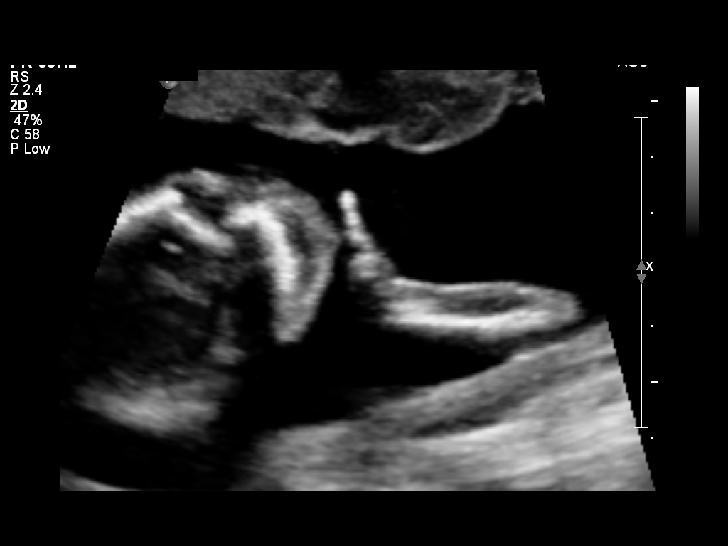
[im 75/78]
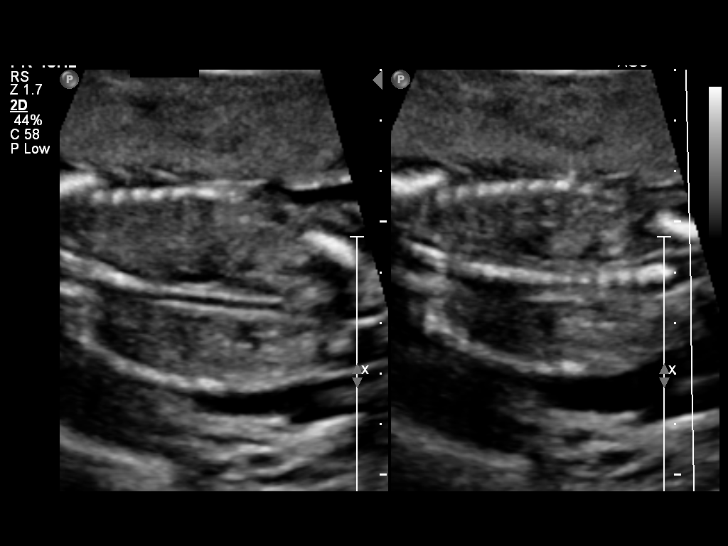

[12 of 28 positions shown; findings below may reference images not displayed]

OBSTETRICS REPORT
                      (Signed Final [DATE] [DATE])

Service(s) Provided

 US OB COMP + 14 WK                                    76805.1
Indications

 Basic anatomic survey                                 [P8]
 Hypertension - Gestational
Fetal Evaluation

 Num Of Fetuses:    1
 Fetal Heart Rate:  153                         bpm
 Cardiac Activity:  Observed
 Presentation:      Breech
 Placenta:          Anterior, above cervical os
 P. Cord            Visualized, central
 Insertion:

 Amniotic Fluid
 AFI FV:      Subjectively within normal limits
                                             Larg Pckt:   6.71   cm
Biometry

 BPD:       42  mm    G. Age:   18w 5d                CI:        71.86   70 - 86
                                                      FL/HC:      17.6   15.8 -
                                                                         18
 HC:     157.7  mm    G. Age:   18w 5d       60  %    HC/AC:      1.18   1.07 -

 AC:     133.9  mm    G. Age:   18w 6d       66  %    FL/BPD:
 FL:      27.8  mm    G. Age:   18w 4d       52  %    FL/AC:      20.8   20 - 24
 HUM:     27.6  mm    G. Age:   18w 6d       69  %
 CER:     18.5  mm    G. Age:   18w 2d       48  %
 NFT:     4.74  mm

 Est. FW:     253  gm      0 lb 9 oz     54  %
Gestational Age

 U/S Today:     18w 5d                                        EDD:   [DATE]
 Best:          18w 2d    Det. By:   Early Ultrasound         EDD:   [DATE]
                                     ([DATE])
Anatomy

 Cranium:          Appears normal         Aortic Arch:      Appears normal
 Fetal Cavum:      Appears normal         Ductal Arch:      Appears normal
 Ventricles:       Appears normal         Diaphragm:        Appears normal
 Choroid Plexus:   Appears normal         Stomach:          Appears normal
 Cerebellum:       Appears normal         Abdomen:          Appears normal
 Posterior Fossa:  Appears normal         Abdominal Wall:   Appears nml (cord
                                                            insert, abd wall)
 Nuchal Fold:      Appears normal         Cord Vessels:     Appears normal (3
                                                            vessel cord)
 Face:             Appears normal         Kidneys:          Appear normal
                   (orbits and profile)
 Lips:             Appears normal         Bladder:          Appears normal
 Heart:            Echogenic focus        Spine:            Appears normal
                   in LV
 RVOT:             Appears normal         Lower             Appears normal
                                          Extremities:
 LVOT:             Appears normal         Upper             Appears normal
                                          Extremities:

 Other:  Male gender. Right 5th digit visualized.
Targeted Anatomy

 Fetal Central Nervous System
 Lat. Ventricles:  8.4                    Cisterna Magna:
Cervix Uterus Adnexa

 Cervical Length:   4.57      cm

 Cervix:       Normal appearance by transabdominal scan.
 Uterus:       No abnormality visualized.
 Cul De Sac:   No free fluid seen.
 Left Ovary:   Not visualized.
 Right Ovary:  Not visualized.
 Adnexa:     No abnormality visualized.
Impression

 Single living intrauterine pregnancy in breech presentation.
 The estimated gestational age is 18w 2d based on Early
 Ultrasound  ([DATE]).
 There has been appropriate growth since prior ultrasound.
 There is an echogenic intracardiac focus in the left ventricle,
 a soft marker for Down Syndrome.
 Other anatomy appears normal.

## 2013-06-08 ENCOUNTER — Encounter: Payer: Self-pay | Admitting: Family Medicine

## 2013-06-08 LAB — CREATININE CLEARANCE, URINE, 24 HOUR: Creatinine, 24H Ur: 1818 mg/d — ABNORMAL HIGH (ref 700–1800)

## 2013-06-08 LAB — PROTEIN, URINE, 24 HOUR: Protein, Urine: 7 mg/dL

## 2013-06-09 ENCOUNTER — Other Ambulatory Visit: Payer: Self-pay | Admitting: Medical Oncology

## 2013-06-09 DIAGNOSIS — D693 Immune thrombocytopenic purpura: Secondary | ICD-10-CM

## 2013-06-12 ENCOUNTER — Telehealth: Payer: Self-pay | Admitting: Oncology

## 2013-06-12 ENCOUNTER — Encounter: Payer: Self-pay | Admitting: Internal Medicine

## 2013-06-12 ENCOUNTER — Telehealth: Payer: Self-pay | Admitting: Internal Medicine

## 2013-06-12 ENCOUNTER — Other Ambulatory Visit (HOSPITAL_BASED_OUTPATIENT_CLINIC_OR_DEPARTMENT_OTHER): Payer: Medicaid Other | Admitting: Lab

## 2013-06-12 ENCOUNTER — Ambulatory Visit (HOSPITAL_BASED_OUTPATIENT_CLINIC_OR_DEPARTMENT_OTHER): Payer: Medicaid Other | Admitting: Internal Medicine

## 2013-06-12 VITALS — BP 128/76 | HR 92 | Temp 98.5°F | Resp 18 | Ht 68.0 in | Wt 211.2 lb

## 2013-06-12 DIAGNOSIS — D693 Immune thrombocytopenic purpura: Secondary | ICD-10-CM

## 2013-06-12 LAB — CBC WITH DIFFERENTIAL/PLATELET
BASO%: 0.4 % (ref 0.0–2.0)
Basophils Absolute: 0 10e3/uL (ref 0.0–0.1)
EOS%: 1.5 % (ref 0.0–7.0)
Eosinophils Absolute: 0.1 10e3/uL (ref 0.0–0.5)
HCT: 32.6 % — ABNORMAL LOW (ref 34.8–46.6)
HGB: 11.4 g/dL — ABNORMAL LOW (ref 11.6–15.9)
LYMPH%: 25.8 % (ref 14.0–49.7)
MCH: 29.5 pg (ref 25.1–34.0)
MCHC: 35.1 g/dL (ref 31.5–36.0)
MCV: 84.2 fL (ref 79.5–101.0)
MONO#: 0.5 10e3/uL (ref 0.1–0.9)
MONO%: 5.2 % (ref 0.0–14.0)
NEUT#: 5.9 10e3/uL (ref 1.5–6.5)
NEUT%: 67.1 % (ref 38.4–76.8)
Platelets: 75 10e3/uL — ABNORMAL LOW (ref 145–400)
RBC: 3.87 10e6/uL (ref 3.70–5.45)
RDW: 14.8 % — ABNORMAL HIGH (ref 11.2–14.5)
WBC: 8.8 10e3/uL (ref 3.9–10.3)
lymph#: 2.3 10e3/uL (ref 0.9–3.3)

## 2013-06-12 LAB — COMPREHENSIVE METABOLIC PANEL (CC13)
ALT: 15 U/L (ref 0–55)
AST: 14 U/L (ref 5–34)
Albumin: 2.6 g/dL — ABNORMAL LOW (ref 3.5–5.0)
Alkaline Phosphatase: 59 U/L (ref 40–150)
BUN: 6.6 mg/dL — ABNORMAL LOW (ref 7.0–26.0)
CO2: 19 meq/L — ABNORMAL LOW (ref 22–29)
Calcium: 8.7 mg/dL (ref 8.4–10.4)
Chloride: 108 meq/L — ABNORMAL HIGH (ref 98–107)
Creatinine: 0.6 mg/dL (ref 0.6–1.1)
Glucose: 109 mg/dL — ABNORMAL HIGH (ref 70–99)
Potassium: 3.4 meq/L — ABNORMAL LOW (ref 3.5–5.1)
Sodium: 136 meq/L (ref 136–145)
Total Bilirubin: 0.23 mg/dL (ref 0.20–1.20)
Total Protein: 6.5 g/dL (ref 6.4–8.3)

## 2013-06-12 NOTE — Progress Notes (Signed)
[facility] [phone]   [phone]  OFFICE PROGRESS NOTE  No PCP Per Patient [address] [place] [address]  DIAGNOSIS:  Pregnant lady with history of ITP  PRIOR THERAPY: None  CURRENT THERAPY: Observation  INTERVAL HISTORY: [name] [name] 27 y.o. female returns to the clinic today for followup visit. She is a former patient of Dr. Dalene Carrow and she is here today to establish care with me. The patient has a history of ITP and has been seen several time but Dr. Dalene Carrow during her previous pregnancies. She was last seen in October of 2011. She is currently [redacted] weeks pregnant. She was referred for evaluation and recommendation regarding her thrombocytopenia during pregnancy. She abnormal delivered in the past and did not require any epidural anesthesia but was managed only with pain medication according to the patient. This is pregnancy #7. The patient denied having any bleeding issues except for nosebleeds started a month ago. It's mainly in the left nostril. She also has a history of epistaxis even when she is not pregnant but it is usually once a month. During pregnancy her platelets count ranged between 50,000-80,000. When she is not pregnant her platelets count ranged between 80,000-115,000. She is feeling fine today with no specific complaints except for mild fatigue. She denied having any bruises or ecchymosis.   MEDICAL HISTORY: Past Medical History  Diagnosis Date  . ITP (idiopathic thrombocytopenic purpura)   . ITP (idiopathic thrombocytopenic purpura)     ALLERGIES:  has No Known Allergies.  MEDICATIONS:  Current Outpatient Prescriptions  Medication Sig Dispense Refill  . Prenatal Vit-Fe Fumarate-FA (PRENATAL VITAMINS) 28-0.8 MG TABS Take 1 tablet by mouth.      . traMADol (ULTRAM) 50 MG tablet Take 1 tablet (50 mg total) by mouth every 6 (six) hours as needed for pain.  30 tablet  0  . acetaminophen (TYLENOL) 500 MG tablet Take  1,000 mg by mouth daily as needed for pain or fever.      . Aspirin-Salicylamide-Caffeine (BC HEADACHE POWDER PO) Take 1 Container by mouth 2 (two) times daily as needed.      . butalbital-acetaminophen-caffeine (ESGIC PLUS) 50-500-40 MG per tablet Take 1-2 tablets by mouth every 4 (four) hours as needed for pain or headache.  30 tablet  1   No current facility-administered medications for this visit.    SURGICAL HISTORY:  Past Surgical History  Procedure Laterality Date  . Surgical repair to wrist      REVIEW OF SYSTEMS:  A comprehensive review of systems was negative except for: Constitutional: positive for fatigue Ears, nose, mouth, throat, and face: positive for epistaxis   PHYSICAL EXAMINATION: General appearance: alert, cooperative and no distress Head: Normocephalic, without obvious abnormality, atraumatic Neck: no adenopathy Lymph nodes: Cervical, supraclavicular, and axillary nodes normal. Resp: clear to auscultation bilaterally Cardio: regular rate and rhythm, S1, S2 normal, no murmur, click, rub or gallop GI: Distended corresponding to her current pregnancy Extremities: extremities normal, atraumatic, no cyanosis or edema Neurologic: Alert and oriented X 3, normal strength and tone. Normal symmetric reflexes. Normal coordination and gait  ECOG PERFORMANCE STATUS: 1 - Symptomatic but completely ambulatory  Blood pressure 128/76, pulse 92, temperature 98.5 F (36.9 C), temperature source Oral, resp. rate 18, height 5\' 8"  (1.727 m), weight 211 lb 3.2 oz (95.8 kg), last menstrual period 12/11/2012.  LABORATORY DATA: Lab Results  Component Value Date   WBC 8.8 06/12/2013   HGB 11.4* 06/12/2013  HCT 32.6* 06/12/2013   MCV 84.2 06/12/2013   PLT 75* 06/12/2013      Chemistry      Component Value Date/Time   NA 136 06/12/2013 1448   NA 136 06/05/2013 1030   K 3.4* 06/12/2013 1448   K 3.5 06/05/2013 1030   CL 108* 06/12/2013 1448   CL 103 06/05/2013 1030   CO2 19* 06/12/2013 1448    CO2 25 06/05/2013 1030   BUN 6.6* 06/12/2013 1448   BUN 8 06/05/2013 1030   CREATININE 0.6 06/12/2013 1448   CREATININE 0.62 06/07/2013 1037   CREATININE 0.62 06/05/2013 1030   CREATININE 0.61 04/09/2013 0915      Component Value Date/Time   CALCIUM 8.7 06/12/2013 1448   CALCIUM 8.9 06/05/2013 1030   ALKPHOS 59 06/12/2013 1448   ALKPHOS 52 06/05/2013 1030   AST 14 06/12/2013 1448   AST 15 06/05/2013 1030   ALT 15 06/12/2013 1448   ALT 15 06/05/2013 1030   BILITOT 0.23 06/12/2013 1448   BILITOT 0.2* 06/05/2013 1030       RADIOGRAPHIC STUDIES: US Ob Comp + 14 Wk  06/06/2013   OBSTETRICAL ULTRASOUND: This exam was performed within a [facility]. The OB US report was generated in the [facility] system, and faxed to the ordering physician.   This report is also available in [employer] and in the [employer]. See [facility] Obstetric US report.   ASSESSMENT AND PLAN: This is a very pleasant 27 years old [place] American female with history of ITP who is currently pregnant [redacted] weeks. Her platelets count has been stable recently and ranging around 75,000. The patient has done well during her previous pregnancy. I don't see a need to start the patient on a treatment at this point as she has stable platelets count. For the epistaxis I referred the patient to ENT for evaluation. I would see her back for followup visit in 2 months with repeat CBC and LDH. I would consider the patient for treatment with steroids only if her platelets count are less than 50,000 or the patient had significant bleeding during the third trimester. The patient agreed to the current plan. She was advised to call immediately if she has any concerning symptoms in the interval.  All questions were answered. The patient knows to call the clinic with any problems, questions or concerns. We can certainly see the patient much sooner if necessary.  I spent 20 minutes counseling the patient face to face. The total  time spent in the appointment was 30 minutes.

## 2013-06-12 NOTE — Patient Instructions (Signed)
I will refer you to ENT for evaluation of the epistaxis. Followup visit in 2 months for reevaluation and repeat CBC.

## 2013-06-13 ENCOUNTER — Telehealth: Payer: Self-pay | Admitting: Internal Medicine

## 2013-06-13 NOTE — Telephone Encounter (Signed)
Faxed pt medical records to [facility]

## 2013-06-26 ENCOUNTER — Ambulatory Visit (INDEPENDENT_AMBULATORY_CARE_PROVIDER_SITE_OTHER): Payer: Medicaid Other | Admitting: Family Medicine

## 2013-06-26 VITALS — BP 113/77 | Temp 99.1°F | Wt 210.7 lb

## 2013-06-26 DIAGNOSIS — O09899 Supervision of other high risk pregnancies, unspecified trimester: Secondary | ICD-10-CM

## 2013-06-26 LAB — POCT URINALYSIS DIP (DEVICE)
Bilirubin Urine: NEGATIVE
Glucose, UA: NEGATIVE mg/dL
Specific Gravity, Urine: 1.025 (ref 1.005–1.030)

## 2013-06-26 NOTE — Progress Notes (Signed)
Pulse-  84 Edema-hands/feet

## 2013-06-26 NOTE — Patient Instructions (Signed)
Pregnancy - Second Trimester The second trimester of pregnancy (3 to 6 months) is a period of rapid growth for you and your baby. At the end of the sixth month, your baby is about 9 inches long and weighs 1 1/2 pounds. You will begin to feel the baby move between 18 and 20 weeks of the pregnancy. This is called quickening. Weight gain is faster. A clear fluid (colostrum) may leak out of your breasts. You may feel small contractions of the womb (uterus). This is known as false labor or Braxton-Hicks contractions. This is like a practice for labor when the baby is ready to be born. Usually, the problems with morning sickness have usually passed by the end of your first trimester. Some women develop small dark blotches (called cholasma, mask of pregnancy) on their face that usually goes away after the baby is born. Exposure to the sun makes the blotches worse. Acne may also develop in some pregnant women and pregnant women who have acne, may find that it goes away. PRENATAL EXAMS  Blood work may continue to be done during prenatal exams. These tests are done to check on your health and the probable health of your baby. Blood work is used to follow your blood levels (hemoglobin). Anemia (low hemoglobin) is common during pregnancy. Iron and vitamins are given to help prevent this. You will also be checked for diabetes between 24 and 28 weeks of the pregnancy. Some of the previous blood tests may be repeated.  The size of the uterus is measured during each visit. This is to make sure that the baby is continuing to grow properly according to the dates of the pregnancy.  Your blood pressure is checked every prenatal visit. This is to make sure you are not getting toxemia.  Your urine is checked to make sure you do not have an infection, diabetes or protein in the urine.  Your weight is checked often to make sure gains are happening at the suggested rate. This is to ensure that both you and your baby are  growing normally.  Sometimes, an ultrasound is performed to confirm the proper growth and development of the baby. This is a test which bounces harmless sound waves off the baby so your caregiver can more accurately determine due dates. Sometimes, a test is done on the amniotic fluid surrounding the baby. This test is called an amniocentesis. The amniotic fluid is obtained by sticking a needle into the belly (abdomen). This is done to check the chromosomes in instances where there is a concern about possible genetic problems with the baby. It is also sometimes done near the end of pregnancy if an early delivery is required. In this case, it is done to help make sure the baby's lungs are mature enough for the baby to live outside of the womb. CHANGES OCCURING IN THE SECOND TRIMESTER OF PREGNANCY Your body goes through many changes during pregnancy. They vary from person to person. Talk to your caregiver about changes you notice that you are concerned about.  During the second trimester, you will likely have an increase in your appetite. It is normal to have cravings for certain foods. This varies from person to person and pregnancy to pregnancy.  Your lower abdomen will begin to bulge.  You may have to urinate more often because the uterus and baby are pressing on your bladder. It is also common to get more bladder infections during pregnancy. You can help this by drinking lots of fluids   and emptying your bladder before and after intercourse.  You may begin to get stretch marks on your hips, abdomen, and breasts. These are normal changes in the body during pregnancy. There are no exercises or medicines to take that prevent this change.  You may begin to develop swollen and bulging veins (varicose veins) in your legs. Wearing support hose, elevating your feet for 15 minutes, 3 to 4 times a day and limiting salt in your diet helps lessen the problem.  Heartburn may develop as the uterus grows and  pushes up against the stomach. Antacids recommended by your caregiver helps with this problem. Also, eating smaller meals 4 to 5 times a day helps.  Constipation can be treated with a stool softener or adding bulk to your diet. Drinking lots of fluids, and eating vegetables, fruits, and whole grains are helpful.  Exercising is also helpful. If you have been very active up until your pregnancy, most of these activities can be continued during your pregnancy. If you have been less active, it is helpful to start an exercise program such as walking.  Hemorrhoids may develop at the end of the second trimester. Warm sitz baths and hemorrhoid cream recommended by your caregiver helps hemorrhoid problems.  Backaches may develop during this time of your pregnancy. Avoid heavy lifting, wear low heal shoes, and practice good posture to help with backache problems.  Some pregnant women develop tingling and numbness of their hand and fingers because of swelling and tightening of ligaments in the wrist (carpel tunnel syndrome). This goes away after the baby is born.  As your breasts enlarge, you may have to get a bigger bra. Get a comfortable, cotton, support bra. Do not get a nursing bra until the last month of the pregnancy if you will be nursing the baby.  You may get a dark line from your belly button to the pubic area called the linea nigra.  You may develop rosy cheeks because of increase blood flow to the face.  You may develop spider looking lines of the face, neck, arms, and chest. These go away after the baby is born. HOME CARE INSTRUCTIONS   It is extremely important to avoid all smoking, herbs, alcohol, and unprescribed drugs during your pregnancy. These chemicals affect the formation and growth of the baby. Avoid these chemicals throughout the pregnancy to ensure the delivery of a healthy infant.  Most of your home care instructions are the same as suggested for the first trimester of your  pregnancy. Keep your caregiver's appointments. Follow your caregiver's instructions regarding medicine use, exercise, and diet.  During pregnancy, you are providing food for you and your baby. Continue to eat regular, well-balanced meals. Choose foods such as meat, fish, milk and other low fat dairy products, vegetables, fruits, and whole-grain breads and cereals. Your caregiver will tell you of the ideal weight gain.  A physical sexual relationship may be continued up until near the end of pregnancy if there are no other problems. Problems could include early (premature) leaking of amniotic fluid from the membranes, vaginal bleeding, abdominal pain, or other medical or pregnancy problems.  Exercise regularly if there are no restrictions. Check with your caregiver if you are unsure of the safety of some of your exercises. The greatest weight gain will occur in the last 2 trimesters of pregnancy. Exercise will help you:  Control your weight.  Get you in shape for labor and delivery.  Lose weight after you have the baby.  Wear   a good support or jogging bra for breast tenderness during pregnancy. This may help if worn during sleep. Pads or tissues may be used in the bra if you are leaking colostrum.  Do not use hot tubs, steam rooms or saunas throughout the pregnancy.  Wear your seat belt at all times when driving. This protects you and your baby if you are in an accident.  Avoid raw meat, uncooked cheese, cat litter boxes, and soil used by cats. These carry germs that can cause birth defects in the baby.  The second trimester is also a good time to visit your dentist for your dental health if this has not been done yet. Getting your teeth cleaned is okay. Use a soft toothbrush. Brush gently during pregnancy.  It is easier to leak urine during pregnancy. Tightening up and strengthening the pelvic muscles will help with this problem. Practice stopping your urination while you are going to the  bathroom. These are the same muscles you need to strengthen. It is also the muscles you would use as if you were trying to stop from passing gas. You can practice tightening these muscles up 10 times a set and repeating this about 3 times per day. Once you know what muscles to tighten up, do not perform these exercises during urination. It is more likely to contribute to an infection by backing up the urine.  Ask for help if you have financial, counseling, or nutritional needs during pregnancy. Your caregiver will be able to offer counseling for these needs as well as refer you for other special needs.  Your skin may become oily. If so, wash your face with mild soap, use non-greasy moisturizer and oil or cream based makeup. MEDICINES AND DRUG USE IN PREGNANCY  Take prenatal vitamins as directed. The vitamin should contain 1 milligram of folic acid. Keep all vitamins out of reach of children. Only a couple vitamins or tablets containing iron may be fatal to a baby or young child when ingested.  Avoid use of all medicines, including herbs, over-the-counter medicines, not prescribed or suggested by your caregiver. Only take over-the-counter or prescription medicines for pain, discomfort, or fever as directed by your caregiver. Do not use aspirin.  Let your caregiver also know about herbs you may be using.  Alcohol is related to a number of birth defects. This includes fetal alcohol syndrome. All alcohol, in any form, should be avoided completely. Smoking will cause low birth rate and premature babies.  Street or illegal drugs are very harmful to the baby. They are absolutely forbidden. A baby born to an addicted mother will be addicted at birth. The baby will go through the same withdrawal an adult does. SEEK MEDICAL CARE IF:  You have any concerns or worries during your pregnancy. It is better to call with your questions if you feel they cannot wait, rather than worry about them. SEEK IMMEDIATE  MEDICAL CARE IF:   An unexplained oral temperature above 102 F (38.9 C) develops, or as your caregiver suggests.  You have leaking of fluid from the vagina (birth canal). If leaking membranes are suspected, take your temperature and tell your caregiver of this when you call.  There is vaginal spotting, bleeding, or passing clots. Tell your caregiver of the amount and how many pads are used. Light spotting in pregnancy is common, especially following intercourse.  You develop a bad smelling vaginal discharge with a change in the color from clear to white.  You continue to feel   sick to your stomach (nauseated) and have no relief from remedies suggested. You vomit blood or coffee ground-like materials.  You lose more than 2 pounds of weight or gain more than 2 pounds of weight over 1 week, or as suggested by your caregiver.  You notice swelling of your face, hands, feet, or legs.  You get exposed to German measles and have never had them.  You are exposed to fifth disease or chickenpox.  You develop belly (abdominal) pain. Round ligament discomfort is a common non-cancerous (benign) cause of abdominal pain in pregnancy. Your caregiver still must evaluate you.  You develop a bad headache that does not go away.  You develop fever, diarrhea, pain with urination, or shortness of breath.  You develop visual problems, blurry, or double vision.  You fall or are in a car accident or any kind of trauma.  There is mental or physical violence at home. Document Released: 12/08/2001 Document Revised: 09/07/2012 Document Reviewed: 06/12/2009 [employer] Patient Information 2014 [employer].  Breastfeeding A change in hormones during your pregnancy causes growth of your breast tissue and an increase in number and size of milk ducts. The hormone prolactin allows proteins, sugars, and fats from your blood supply to make breast milk in your milk-producing glands. The hormone progesterone prevents  breast milk from being released before the birth of your baby. After the birth of your baby, your progesterone level decreases allowing breast milk to be released. Thoughts of your baby, as well as his or her sucking or crying, can stimulate the release of milk from the milk-producing glands. Deciding to breastfeed (nurse) is one of the best choices you can make for you and your baby. The information that follows gives a brief review of the benefits, as well as other important skills to know about breastfeeding. BENEFITS OF BREASTFEEDING For your baby  The first milk (colostrum) helps your baby's digestive system function better.   There are antibodies in your milk that help your baby fight off infections.   Your baby has a lower incidence of asthma, allergies, and sudden infant death syndrome (SIDS).   The nutrients in breast milk are better for your baby than infant formulas.  Breast milk improves your baby's brain development.   Your baby will have less gas, colic, and constipation.  Your baby is less likely to develop other conditions, such as childhood obesity, asthma, or diabetes mellitus. For you  Breastfeeding helps develop a very special bond between you and your baby.   Breastfeeding is convenient, always available at the correct temperature, and costs nothing.   Breastfeeding helps to burn calories and helps you lose the weight gained during pregnancy.   Breastfeeding makes your uterus contract back down to normal size faster and slows bleeding following delivery.   Breastfeeding mothers have a lower risk of developing osteoporosis or breast or ovarian cancer later in life.  BREASTFEEDING FREQUENCY  A healthy, full-term baby may breastfeed as often as every hour or space his or her feedings to every 3 hours. Breastfeeding frequency will vary from baby to baby.   Newborns should be fed no less than every 2 3 hours during the day and every 4 5 hours during the  night. You should breastfeed a minimum of 8 feedings in a 24 hour period.  Awaken your baby to breastfeed if it has been 3 4 hours since the last feeding.  Breastfeed when you feel the need to reduce the fullness of your breasts or when   your newborn shows signs of hunger. Signs that your baby may be hungry include:  Increased alertness or activity.  Stretching.  Movement of the head from side to side.  Movement of the head and opening of the mouth when the corner of the mouth or cheek is stroked (rooting).  Increased sucking sounds, smacking lips, cooing, sighing, or squeaking.  Hand-to-mouth movements.  Increased sucking of fingers or hands.  Fussing.  Intermittent crying.  Signs of extreme hunger will require calming and consoling before you try to feed your baby. Signs of extreme hunger may include:  Restlessness.  A loud, strong cry.  Screaming.  Frequent feeding will help you make more milk and will help prevent problems, such as sore nipples and engorgement of the breasts.  BREASTFEEDING   Whether lying down or sitting, be sure that the baby's abdomen is facing your abdomen.   Support your breast with 4 fingers under your breast and your thumb above your nipple. Make sure your fingers are well away from your nipple and your baby's mouth.   Stroke your baby's lips gently with your finger or nipple.   When your baby's mouth is open wide enough, place all of your nipple and as much of the colored area around your nipple (areola) as possible into your baby's mouth.  More areola should be visible above his or her upper lip than below his or her lower lip.  Your baby's tongue should be between his or her lower gum and your breast.  Ensure that your baby's mouth is correctly positioned around the nipple (latched). Your baby's lips should create a seal on your breast.  Signs that your baby has effectively latched onto your nipple include:  Tugging or sucking  without pain.  Swallowing heard between sucks.  Absent click or smacking sound.  Muscle movement above and in front of his or her ears with sucking.  Your baby must suck about 2 3 minutes in order to get your milk. Allow your baby to feed on each breast as long as he or she wants. Nurse your baby until he or she unlatches or falls asleep at the first breast, then offer the second breast.  Signs that your baby is full and satisfied include:  A gradual decrease in the number of sucks or complete cessation of sucking.  Falling asleep.  Extension or relaxation of his or her body.  Retention of a small amount of milk in his or her mouth.  Letting go of your breast by himself or herself.  Signs of effective breastfeeding in you include:  Breasts that have increased firmness, weight, and size prior to feeding.  Breasts that are softer after nursing.  Increased milk volume, as well as a change in milk consistency and color by the 5th day of breastfeeding.  Breast fullness relieved by breastfeeding.  Nipples are not sore, cracked, or bleeding.  If needed, break the suction by putting your finger into the corner of your baby's mouth and sliding your finger between his or her gums. Then, remove your breast from his or her mouth.  It is common for babies to spit up a small amount after a feeding.  Babies often swallow air during feeding. This can make babies fussy. Burping your baby between breasts can help with this.  Vitamin D supplements are recommended for babies who get only breast milk.  Avoid using a pacifier during your baby's first 4 6 weeks.  Avoid supplemental feedings of water, formula, or   juice in place of breastfeeding. Breast milk is all the food your baby needs. It is not necessary for your baby to have water or formula. Your breasts will make more milk if supplemental feedings are avoided during the early weeks. HOW TO TELL WHETHER YOUR BABY IS GETTING ENOUGH BREAST  MILK Wondering whether or not your baby is getting enough milk is a common concern among mothers. You can be assured that your baby is getting enough milk if:   Your baby is actively sucking and you hear swallowing.   Your baby seems relaxed and satisfied after a feeding.   Your baby nurses at least 8 12 times in a 24 hour time period.  During the first 3 5 days of age:  Your baby is wetting at least 3 5 diapers in a 24 hour period. The urine should be clear and pale yellow.  Your baby is having at least 3 4 stools in a 24 hour period. The stool should be soft and yellow.  At 5 7 days of age, your baby is having at least 3 6 stools in a 24 hour period. The stool should be seedy and yellow by 5 days of age.  Your baby has a weight loss less than 7 10% during the first 3 days of age.  Your baby does not lose weight after 3 7 days of age.  Your baby gains 4 7 ounces each week after he or she is 4 days of age.  Your baby gains weight by 5 days of age and is back to birth weight within 2 weeks. ENGORGEMENT In the first week after your baby is born, you may experience extremely full breasts (engorgement). When engorged, your breasts may feel heavy, warm, or tender to the touch. Engorgement peaks within 24 48 hours after delivery of your baby.  Engorgement may be reduced by:  Continuing to breastfeed.  Increasing the frequency of breastfeeding.  Taking warm showers or applying warm, moist heat to your breasts just before each feeding. This increases circulation and helps the milk flow.   Gently massaging your breast before and during the feedings. With your fingertips, massage from your chest wall towards your nipple in a circular motion.   Ensuring that your baby empties at least one breast at every feeding. It also helps to start the next feeding on the opposite breast.   Expressing breast milk by hand or by using a breast pump to empty the breasts if your baby is sleepy, or  not nursing well. You may also want to express milk if you are returning to work oryou feel you are getting engorged.  Ensuring your baby is latched on and positioned properly while breastfeeding. If you follow these suggestions, your engorgement should improve in 24 48 hours. If you are still experiencing difficulty, call your lactation consultant or caregiver.  CARING FOR YOURSELF Take care of your breasts.  Bathe or shower daily.   Avoid using soap on your nipples.   Wear a supportive bra. Avoid wearing underwire style bras.  Air dry your nipples for a 3 4minutes after each feeding.   Use only cotton bra pads to absorb breast milk leakage. Leaking of breast milk between feedings is normal.   Use only pure lanolin on your nipples after nursing. You do not need to wash it off before feeding your baby again. Another option is to express a few drops of breast milk and gently massage that milk into your nipples.  Continue   breast self-awareness checks. Take care of yourself.  Eat healthy foods. Alternate 3 meals with 3 snacks.  Avoid foods that you notice affect your baby in a bad way.  Drink milk, fruit juice, and water to satisfy your thirst (about 8 glasses a day).   Rest often, relax, and take your prenatal vitamins to prevent fatigue, stress, and anemia.  Avoid chewing and smoking tobacco.  Avoid alcohol and drug use.  Take over-the-counter and prescribed medicine only as directed by your caregiver or pharmacist. You should always check with your caregiver or pharmacist before taking any new medicine, vitamin, or herbal supplement.  Know that pregnancy is possible while breastfeeding. If desired, talk to your caregiver about family planning and safe birth control methods that may be used while breastfeeding. SEEK MEDICAL CARE IF:   You feel like you want to stop breastfeeding or have become frustrated with breastfeeding.  You have painful breasts or nipples.  Your  nipples are cracked or bleeding.  Your breasts are red, tender, or warm.  You have a swollen area on either breast.  You have a fever or chills.  You have nausea or vomiting.  You have drainage from your nipples.  Your breasts do not become full before feedings by the 5th day after delivery.  You feel sad and depressed.  Your baby is too sleepy to eat well.  Your baby is having trouble sleeping.   Your baby is wetting less than 3 diapers in a 24 hour period.  Your baby has less than 3 stools in a 24 hour period.  Your baby's skin or the white part of his or her eyes becomes more yellow.   Your baby is not gaining weight by 5 days of age. MAKE SURE YOU:   Understand these instructions.  Will watch your condition.  Will get help right away if you are not doing well or get worse. Document Released: 12/14/2005 Document Revised: 09/07/2012 Document Reviewed: 07/20/2012 [employer] Patient Information 2014 [employer].  

## 2013-06-26 NOTE — Progress Notes (Signed)
Doing well--Sees Dr. Arbutus Ped Will work on increasing platelets for epidural and BTL.

## 2013-06-27 ENCOUNTER — Encounter: Payer: Self-pay | Admitting: *Deleted

## 2013-06-30 ENCOUNTER — Encounter: Payer: Self-pay | Admitting: Family Medicine

## 2013-07-15 ENCOUNTER — Encounter (HOSPITAL_COMMUNITY): Payer: Self-pay | Admitting: *Deleted

## 2013-07-15 ENCOUNTER — Telehealth: Payer: Self-pay | Admitting: Family Medicine

## 2013-07-15 ENCOUNTER — Telehealth: Payer: Self-pay | Admitting: Women's Health

## 2013-07-15 ENCOUNTER — Inpatient Hospital Stay (HOSPITAL_COMMUNITY)
Admission: AD | Admit: 2013-07-15 | Discharge: 2013-07-16 | DRG: 781 | Disposition: A | Discharge status: Home / Self Care | Payer: Medicaid Other | Source: Ambulatory Visit | Attending: Obstetrics and Gynecology | Admitting: Obstetrics and Gynecology

## 2013-07-15 ENCOUNTER — Inpatient Hospital Stay (HOSPITAL_COMMUNITY)
Admission: AD | Admit: 2013-07-15 | Discharge: 2013-07-15 | Disposition: A | Discharge status: Home / Self Care | Payer: Medicaid Other | Source: Ambulatory Visit | Attending: Obstetrics and Gynecology | Admitting: Obstetrics and Gynecology

## 2013-07-15 ENCOUNTER — Telehealth (HOSPITAL_COMMUNITY): Payer: Self-pay | Admitting: *Deleted

## 2013-07-15 DIAGNOSIS — O2390 Unspecified genitourinary tract infection in pregnancy, unspecified trimester: Principal | ICD-10-CM | POA: Diagnosis present

## 2013-07-15 DIAGNOSIS — D693 Immune thrombocytopenic purpura: Secondary | ICD-10-CM | POA: Diagnosis present

## 2013-07-15 DIAGNOSIS — N3090 Cystitis, unspecified without hematuria: Secondary | ICD-10-CM | POA: Diagnosis present

## 2013-07-15 DIAGNOSIS — N12 Tubulo-interstitial nephritis, not specified as acute or chronic: Secondary | ICD-10-CM | POA: Insufficient documentation

## 2013-07-15 DIAGNOSIS — N3091 Cystitis, unspecified with hematuria: Secondary | ICD-10-CM | POA: Diagnosis present

## 2013-07-15 DIAGNOSIS — D689 Coagulation defect, unspecified: Secondary | ICD-10-CM | POA: Diagnosis present

## 2013-07-15 DIAGNOSIS — O2302 Infections of kidney in pregnancy, second trimester: Secondary | ICD-10-CM

## 2013-07-15 DIAGNOSIS — M549 Dorsalgia, unspecified: Secondary | ICD-10-CM | POA: Insufficient documentation

## 2013-07-15 DIAGNOSIS — R350 Frequency of micturition: Secondary | ICD-10-CM | POA: Insufficient documentation

## 2013-07-15 LAB — CBC WITH DIFFERENTIAL/PLATELET
Basophils Absolute: 0 10*3/uL (ref 0.0–0.1)
Eosinophils Absolute: 0.1 10*3/uL (ref 0.0–0.7)
HCT: 31.7 % — ABNORMAL LOW (ref 36.0–46.0)
Hemoglobin: 10.8 g/dL — ABNORMAL LOW (ref 12.0–15.0)
Lymphocytes Relative: 23 % (ref 12–46)
MCHC: 34.1 g/dL (ref 30.0–36.0)
Monocytes Relative: 6 % (ref 3–12)
Neutro Abs: 5.8 10*3/uL (ref 1.7–7.7)
Neutrophils Relative %: 70 % (ref 43–77)
RDW: 14.4 % (ref 11.5–15.5)
WBC: 8.3 10*3/uL (ref 4.0–10.5)

## 2013-07-15 LAB — URINALYSIS, ROUTINE W REFLEX MICROSCOPIC
Glucose, UA: NEGATIVE mg/dL
Nitrite: NEGATIVE
pH: 6.5 (ref 5.0–8.0)

## 2013-07-15 LAB — URINE MICROSCOPIC-ADD ON

## 2013-07-15 LAB — TYPE AND SCREEN
ABO/RH(D): A POS
Antibody Screen: NEGATIVE

## 2013-07-15 MED ORDER — PRENATAL MULTIVITAMIN CH
1.0000 | ORAL_TABLET | Freq: Every day | ORAL | Status: DC
  Administered 2013-07-15: 1 via ORAL
  Filled 2013-07-15: qty 1

## 2013-07-15 MED ORDER — CEPHALEXIN 500 MG PO CAPS: 500.0000 mg | ORAL_CAPSULE | Freq: Four times a day (QID) | ORAL | Status: DC

## 2013-07-15 MED ORDER — DOCUSATE SODIUM 100 MG PO CAPS
100.0000 mg | ORAL_CAPSULE | Freq: Every day | ORAL | Status: DC
  Administered 2013-07-15: 100 mg via ORAL
  Filled 2013-07-15 (×3): qty 1

## 2013-07-15 MED ORDER — ACETAMINOPHEN 325 MG PO TABS: 650.0000 mg | ORAL_TABLET | ORAL | Status: DC | PRN

## 2013-07-15 MED ORDER — CEFTRIAXONE SODIUM 1 G IJ SOLR
1.0000 g | Freq: Once | INTRAMUSCULAR | Status: AC
  Administered 2013-07-15: 1 g via INTRAMUSCULAR
  Filled 2013-07-15: qty 10

## 2013-07-15 MED ORDER — ZOLPIDEM TARTRATE 5 MG PO TABS
5.0000 mg | ORAL_TABLET | Freq: Every day | ORAL | Status: DC | PRN
  Administered 2013-07-15: 5 mg via ORAL
  Filled 2013-07-15: qty 1

## 2013-07-15 MED ORDER — CALCIUM CARBONATE ANTACID 500 MG PO CHEW
2.0000 | CHEWABLE_TABLET | ORAL | Status: DC | PRN
  Filled 2013-07-15: qty 2

## 2013-07-15 MED ORDER — PHENAZOPYRIDINE HCL 100 MG PO TABS
100.0000 mg | ORAL_TABLET | Freq: Three times a day (TID) | ORAL | Status: DC
  Administered 2013-07-15: 100 mg via ORAL
  Filled 2013-07-15 (×3): qty 1

## 2013-07-15 MED ORDER — DEXTROSE 5 % IV SOLN
1.0000 g | Freq: Two times a day (BID) | INTRAVENOUS | Status: DC
  Administered 2013-07-15: 1 g via INTRAVENOUS
  Filled 2013-07-15 (×2): qty 10

## 2013-07-15 NOTE — MAU Provider Note (Signed)
History    27 yo F  [id number] at [redacted]w[redacted]d presents for complaints of increased urinary frequency and back pain that started 2 days ago. Pt has never had prior back pain similar during pregnancy. Pt is also complaining of symptoms concerning for prolapse for last year.    CSN: [id number]  Arrival date and time: 07/15/13 0806   None     Chief Complaint  Patient presents with  . Urinary Tract Infection   HPI  OB History   Grav Para Term Preterm Abortions TAB SAB Ect Mult Living   11 6 6  0 4 2 1 1  6       Past Medical History  Diagnosis Date  . ITP (idiopathic thrombocytopenic purpura)   . ITP (idiopathic thrombocytopenic purpura)     Past Surgical History  Procedure Laterality Date  . Surgical repair to wrist      Family History  Problem Relation Age of Onset  . Cancer Father     leukemia  . Fibroids Mother   . Thrombocytopenia Brother     ITP    History  Substance Use Topics  . Smoking status: Former Smoker -- 0.50 packs/day for 10 years    Types: Cigarettes    Quit date: 04/12/2013  . Smokeless tobacco: Not on file  . Alcohol Use: No    Allergies: No Known Allergies  Prescriptions prior to admission  Medication Sig Dispense Refill  . acetaminophen (TYLENOL) 500 MG tablet Take 1,000 mg by mouth daily as needed for pain or fever.      . butalbital-acetaminophen-caffeine (ESGIC PLUS) 50-500-40 MG per tablet Take 1-2 tablets by mouth every 4 (four) hours as needed for pain or headache.  30 tablet  1  . Prenatal Vit-Fe Fumarate-FA (PRENATAL MULTIVITAMIN) TABS Take 1 tablet by mouth daily at 12 noon.        Review of Systems  Constitutional: Positive for malaise/fatigue. Negative for fever, chills and diaphoresis.  Respiratory: Negative for cough.   Cardiovascular: Negative for chest pain and leg swelling.  Gastrointestinal: Positive for abdominal pain (Mild suprapubic "pressure"). Negative for nausea, vomiting, diarrhea and constipation.  Genitourinary:  Positive for urgency, frequency and flank pain. Negative for dysuria and hematuria.  Musculoskeletal: Negative for myalgias.  Neurological: Negative for weakness.   Denies Physical Exam   Blood pressure 121/66, pulse 90, temperature 97.8 F (36.6 C), temperature source Oral, resp. rate 18, height 5\' 6"  (1.676 m), weight 96.616 kg (213 lb), last menstrual period 12/11/2012.  Physical Exam  Nursing note and vitals reviewed. Constitutional: She is oriented to person, place, and time. She appears well-developed and well-nourished. No distress.  Cardiovascular: Normal rate, regular rhythm and normal heart sounds.   Respiratory: No respiratory distress. She has no wheezes. She has no rales. She exhibits no tenderness.  GI: She exhibits distension. She exhibits no mass. There is no tenderness (gravid). There is CVA tenderness (right CVAT). There is no rebound and no guarding.  Genitourinary:  Pt with mild cystocele with valsalva  Musculoskeletal: She exhibits no edema and no tenderness.  Neurological: She is alert and oriented to person, place, and time.  Skin: She is not diaphoretic.  Psychiatric: She has a normal mood and affect. Her behavior is normal. Judgment and thought content normal.   Results for orders placed during the hospital encounter of 07/15/13 (from the past 24 hour(s))  URINALYSIS, ROUTINE W REFLEX MICROSCOPIC     Status: Abnormal   Collection Time    07/15/13  8:09 AM      Result Value Range   Color, Urine YELLOW  YELLOW   APPearance CLEAR  CLEAR   Specific Gravity, Urine 1.020  1.005 - 1.030   pH 6.5  5.0 - 8.0   Glucose, UA NEGATIVE  NEGATIVE mg/dL   Hgb urine dipstick TRACE (*) NEGATIVE   Bilirubin Urine NEGATIVE  NEGATIVE   Ketones, ur NEGATIVE  NEGATIVE mg/dL   Protein, ur NEGATIVE  NEGATIVE mg/dL   Urobilinogen, UA 1.0  0.0 - 1.0 mg/dL   Nitrite NEGATIVE  NEGATIVE   Leukocytes, UA MODERATE (*) NEGATIVE  URINE MICROSCOPIC-ADD ON     Status: Abnormal    Collection Time    07/15/13  8:09 AM      Result Value Range   Squamous Epithelial / LPF FEW (*) RARE   WBC, UA 21-50  <3 WBC/hpf   RBC / HPF 0-2  <3 RBC/hpf   Bacteria, UA MANY (*) RARE  CBC WITH DIFFERENTIAL     Status: Abnormal (Preliminary result)   Collection Time    07/15/13  9:00 AM      Result Value Range   WBC 8.3  4.0 - 10.5 K/uL   RBC 3.79 (*) 3.87 - 5.11 MIL/uL   Hemoglobin 10.8 (*) 12.0 - 15.0 g/dL   HCT 04.5 (*) 40.9 - 81.1 %   MCV 83.6  78.0 - 100.0 fL   MCH 28.5  26.0 - 34.0 pg   MCHC 34.1  30.0 - 36.0 g/dL   RDW 91.4  78.2 - 95.6 %   Platelets PENDING  150 - 400 K/uL   Neutrophils Relative % PENDING  43 - 77 %   Neutro Abs PENDING  1.7 - 7.7 K/uL   Band Neutrophils PENDING  0 - 10 %   Lymphocytes Relative PENDING  12 - 46 %   Lymphs Abs PENDING  0.7 - 4.0 K/uL   Monocytes Relative PENDING  3 - 12 %   Monocytes Absolute PENDING  0.1 - 1.0 K/uL   Eosinophils Relative PENDING  0 - 5 %   Eosinophils Absolute PENDING  0.0 - 0.7 K/uL   Basophils Relative PENDING  0 - 1 %   Basophils Absolute PENDING  0.0 - 0.1 K/uL   WBC Morphology PENDING     RBC Morphology PENDING     Smear Review PENDING     nRBC PENDING  0 /100 WBC   Metamyelocytes Relative PENDING     Myelocytes PENDING     Promyelocytes Absolute PENDING     Blasts PENDING     MAU Course  Procedures   Assessment and Plan  27 yo F  [id number] at [redacted]w[redacted]d with pyelonephritis based on PE(CVAT) and UA. Pt however is otherwise well appearing with normal vitals. No evidence of sepsis at this time. - Ceftriaxone 1g Now. Will give Rx for keflex 500mg  qid at this time.  - follow up next week to monitor for UA  Rolan Wrightsman, RYAN 07/15/2013, 9:51 AM

## 2013-07-15 NOTE — MAU Note (Signed)
Increase abdominal pain and pressure

## 2013-07-15 NOTE — Telephone Encounter (Signed)
Returned pt phone call. After leaving MAU, pt reports worse vaginal pressure with urge to "push out a bowling ball." Pt unable to sleep and requests pain meds to help her sleep. Pt denies ctx, and endorses fetal movement., denis LOF/VB. However with worsening sx, pt instructed to return to MAU for cervical check and will likely require admission for monitoring and pain control.  Tawana Scale, MD OB Fellow

## 2013-07-15 NOTE — Plan of Care (Signed)
Problem: Consults Goal: Birthing Suites Patient Information Press F2 to bring up selections list  Outcome: Completed/Met Date Met:  07/15/13  Pt < [redacted] weeks EGA and Antenatal Patient (< 37 weeks)

## 2013-07-15 NOTE — H&P (Signed)
None     Chief Complaint:  Flank Pain   [name] [name] is  27 y.o. [id number] at [redacted]w[redacted]d presents complaining of Flank Pain .  UA and PE consistent with Pyelonephritis. Pt was discharged for outpatient management, however after acute worsening of back pain and pelvic pressure after leaving hospital. Pt was instructed to return to MAU for reevaluation.   She states no contractions are associated with no vaginal bleeding, intact membranes, along with active fetal movement.   Obstetrical/Gynecological History: OB History   Grav Para Term Preterm Abortions TAB SAB Ect Mult Living   11 6 6  0 4 2 1 1  6      Past Medical History: Past Medical History  Diagnosis Date  . ITP (idiopathic thrombocytopenic purpura)   . ITP (idiopathic thrombocytopenic purpura)     Past Surgical History: Past Surgical History  Procedure Laterality Date  . Surgical repair to wrist      Family History: Family History  Problem Relation Age of Onset  . Cancer Father     leukemia  . Fibroids Mother   . Thrombocytopenia Brother     ITP    Social History: History  Substance Use Topics  . Smoking status: Former Smoker -- 0.50 packs/day for 10 years    Types: Cigarettes    Quit date: 04/12/2013  . Smokeless tobacco: Not on file  . Alcohol Use: No    Allergies: No Known Allergies  Meds:  Prescriptions prior to admission  Medication Sig Dispense Refill  . acetaminophen (TYLENOL) 500 MG tablet Take 1,000 mg by mouth daily as needed for pain or fever.      . butalbital-acetaminophen-caffeine (ESGIC PLUS) 50-500-40 MG per tablet Take 1-2 tablets by mouth every 4 (four) hours as needed for pain or headache.  30 tablet  1  . cephALEXin (KEFLEX) 500 MG capsule Take 1 capsule (500 mg total) by mouth 4 (four) times daily.  28 capsule  0  . Prenatal Vit-Fe Fumarate-FA (PRENATAL MULTIVITAMIN) TABS Take 1 tablet by mouth daily at 12 noon.        Review of Systems -   Review of Systems  Constitutional:  Negative for fever, chills, weight loss, malaise/fatigue and diaphoresis.  Musculoskeletal: +Back pain R>L, Negative for myalgias,  joint pain and falls.  Respiratory: Negative for cough, hemoptysis, sputum production, shortness of breath, wheezing and stridor.   Cardiovascular: Negative for chest pain, palpitations, orthopnea,  leg swelling  Gastrointestinal: Negative for abdominal pain heartburn, nausea, vomiting, diarrhea, constipation, blood in stool Genitourinary: Increased urgency, and frequency, flank pain, neg dysuria, urgency    Physical Exam  Blood pressure 127/80, pulse 102, temperature 97.7 F (36.5 C), last menstrual period 12/11/2012. GENERAL: Well-developed, well-nourished female in mild acute distress. Obvious pain, able ot talk in complete sentences LUNGS: Clear to auscultation bilaterally.  HEART: Regular rate and rhythm. ABDOMEN: Soft, nontender, nondistended, gravid.  EXTREMITIES: Nontender, no edema, 2+ distal pulses. CERVICAL EXAM: Dilatation 1cm   Effacement 0%   Station -3 FHT:  Baseline rate 140s bpm   Pending full time   Labs: Results for orders placed during the hospital encounter of 07/15/13 (from the past 24 hour(s))  URINALYSIS, ROUTINE W REFLEX MICROSCOPIC   Collection Time    07/15/13  8:09 AM      Result Value Range   Color, Urine YELLOW  YELLOW   APPearance CLEAR  CLEAR   Specific Gravity, Urine 1.020  1.005 - 1.030   pH 6.5  5.0 -  8.0   Glucose, UA NEGATIVE  NEGATIVE mg/dL   Hgb urine dipstick TRACE (*) NEGATIVE   Bilirubin Urine NEGATIVE  NEGATIVE   Ketones, ur NEGATIVE  NEGATIVE mg/dL   Protein, ur NEGATIVE  NEGATIVE mg/dL   Urobilinogen, UA 1.0  0.0 - 1.0 mg/dL   Nitrite NEGATIVE  NEGATIVE   Leukocytes, UA MODERATE (*) NEGATIVE  URINE MICROSCOPIC-ADD ON   Collection Time    07/15/13  8:09 AM      Result Value Range   Squamous Epithelial / LPF FEW (*) RARE   WBC, UA 21-50  <3 WBC/hpf   RBC / HPF 0-2  <3 RBC/hpf   Bacteria, UA MANY (*)  RARE  CBC WITH DIFFERENTIAL   Collection Time    07/15/13  9:00 AM      Result Value Range   WBC 8.3  4.0 - 10.5 K/uL   RBC 3.79 (*) 3.87 - 5.11 MIL/uL   Hemoglobin 10.8 (*) 12.0 - 15.0 g/dL   HCT 16.1 (*) 09.6 - 04.5 %   MCV 83.6  78.0 - 100.0 fL   MCH 28.5  26.0 - 34.0 pg   MCHC 34.1  30.0 - 36.0 g/dL   RDW 40.9  81.1 - 91.4 %   Platelets 65 (*) 150 - 400 K/uL   Neutrophils Relative % 70  43 - 77 %   Lymphocytes Relative 23  12 - 46 %   Monocytes Relative 6  3 - 12 %   Eosinophils Relative 1  0 - 5 %   Basophils Relative 0  0 - 1 %   Neutro Abs 5.8  1.7 - 7.7 K/uL   Lymphs Abs 1.9  0.7 - 4.0 K/uL   Monocytes Absolute 0.5  0.1 - 1.0 K/uL   Eosinophils Absolute 0.1  0.0 - 0.7 K/uL   Basophils Absolute 0.0  0.0 - 0.1 K/uL   Imaging Studies:  No results found.  Assessment: [name] [name] is  27 y.o. [id number] at [redacted]w[redacted]d presents with pyelonephritis.  Plan: #pyelo: No evidence of sepsis at this time. Will admit for close obs and pain control. If not improving will consider [place] for abscess. Will continue ceftriaxone 1g q12 Pyridium 100mg  TID x2 days F/u Urine culture Tylenol for pain control  #ITP: Stable no changes at this time plt 65  FEN: SLIV/no bplus - replete if needed/Regular diet  Full code  DISPO: Pending pain control - to Antepartum  Icker Swigert, RYAN 7/19/20142:33 PM

## 2013-07-15 NOTE — Progress Notes (Signed)
Pt states her family is Jehov[employer] Witness' . Pt states "I do not want their belief's forced on me. I will except blood in case of an emergency."

## 2013-07-15 NOTE — Telephone Encounter (Signed)
error 

## 2013-07-15 NOTE — MAU Note (Signed)
Pt presents with complaints of having pressure when she urinates and is going to the bathroom to void and is only able to void a scant amount at a time. Denies any bleeding.

## 2013-07-16 DIAGNOSIS — N12 Tubulo-interstitial nephritis, not specified as acute or chronic: Secondary | ICD-10-CM

## 2013-07-16 DIAGNOSIS — O2390 Unspecified genitourinary tract infection in pregnancy, unspecified trimester: Principal | ICD-10-CM

## 2013-07-16 DIAGNOSIS — N3091 Cystitis, unspecified with hematuria: Secondary | ICD-10-CM | POA: Diagnosis present

## 2013-07-16 DIAGNOSIS — O99119 Other diseases of the blood and blood-forming organs and certain disorders involving the immune mechanism complicating pregnancy, unspecified trimester: Secondary | ICD-10-CM

## 2013-07-16 DIAGNOSIS — D693 Immune thrombocytopenic purpura: Secondary | ICD-10-CM

## 2013-07-16 MED ORDER — PHENAZOPYRIDINE HCL 100 MG PO TABS: 100.0000 mg | ORAL_TABLET | Freq: Three times a day (TID) | ORAL | Status: DC

## 2013-07-16 NOTE — Discharge Summary (Signed)
Physician Discharge Summary  Patient ID: [name] [name] MRN: [id number] DOB/AGE: 28/17/1987 28 y.o.  Admit date: 07/15/2013 Discharge date: 07/16/2013  Admission Diagnoses: pyelonephritis  Discharge Diagnoses: same Active Problems:   Cystitis with hematuria   Discharged Condition: good  Hospital Course: Patient admitted overnight for pain management associated with symptoms of cystitis. Patient reported severe cramping pain in lower abdomen upon admission. While hospitalized, she received pyridium, in addition to IV antibiotics and her pain resolved completely. Patient was eager to be discharged. She denied any contraction, vaginal bleeding or leakage of fluid. She reported good fetal movement.  Consults: None  Significant Diagnostic Studies:   Treatments: IV hydration, antibiotics: ceftriaxone and analgesia: pyridium  Discharge Exam: Blood pressure 68/41, pulse 82, temperature 98.5 F (36.9 C), temperature source Oral, resp. rate 20, height 5\' 6"  (1.676 m), weight 213 lb (96.616 kg), last menstrual period 12/11/2012. General appearance: alert, cooperative and no distress Resp: clear to auscultation bilaterally Cardio: regular rate and rhythm GI: soft, gravid, NT Extremities: Homans sign is negative, no sign of DVT and no edema, redness or tenderness in the calves or thighs Negative CVA tenderness  Disposition: 01-Home or Self Care   Future Appointments Provider Department Dept Phone   07/24/2013 7:45 AM Adam Phenix, MD [facility] [phone]   08/09/2013 10:30 AM Krista [facility] MEDICAL ONCOLOGY [phone]   08/09/2013 11:00 AM Si Gaul, MD [facility] MEDICAL ONCOLOGY [phone]       Medication List         acetaminophen 500 MG tablet  Commonly known as:  TYLENOL  Take 1,000 mg by mouth daily as needed for pain or fever.     butalbital-acetaminophen-caffeine 50-500-40 MG per tablet  Commonly known  as:  ESGIC PLUS  Take 1-2 tablets by mouth every 4 (four) hours as needed for pain or headache.     cephALEXin 500 MG capsule  Commonly known as:  KEFLEX  Take 1 capsule (500 mg total) by mouth 4 (four) times daily.     phenazopyridine 100 MG tablet  Commonly known as:  PYRIDIUM  Take 1 tablet (100 mg total) by mouth 3 (three) times daily with meals.     prenatal multivitamin Tabs  Take 1 tablet by mouth daily at 12 noon.     ROLAIDS PO  Take 1 tablet by mouth daily as needed (heartburn).           Follow-up Information   Follow up with [facility]. (As scheduled on 7/30 for prenatal care)    Contact information:   [address] [place] [address] [phone]      Signed: Kachina Niederer 07/16/2013, 7:31 AM

## 2013-07-16 NOTE — Progress Notes (Signed)
Discharge instructions reviewed with patient. Receptive. Left unit ambulatory.

## 2013-07-17 LAB — URINE CULTURE

## 2013-07-18 NOTE — H&P (Signed)
Attestation of Attending Supervision of Advanced Practitioner (CNM/NP): Evaluation and management procedures were performed by the Advanced Practitioner under my supervision and collaboration.  I have reviewed the Advanced Practitioner's note and chart, and I agree with the management and plan.  Ellisyn Icenhower 07/18/2013 11:40 AM

## 2013-07-24 ENCOUNTER — Telehealth: Payer: Self-pay | Admitting: *Deleted

## 2013-07-24 ENCOUNTER — Encounter: Payer: Medicaid Other | Admitting: Obstetrics & Gynecology

## 2013-07-24 NOTE — Telephone Encounter (Signed)
[name] [name] left a message stating she is calling about her intermittent leave of absence forms that are due to [facility] 07/23/13- states she turned them in about  2 weeks ago and and needs to know the status of her papers.  Request a call back

## 2013-07-24 NOTE — Telephone Encounter (Signed)
Called pt and pt informed me that she needs her paperwork filled.  I apologize to the pt for the delay and that we will get her paperwork filled out and faxed to number that is requested on her letter.  Pt stated "thank you" with no other questions.

## 2013-07-31 ENCOUNTER — Ambulatory Visit (INDEPENDENT_AMBULATORY_CARE_PROVIDER_SITE_OTHER): Payer: Medicaid Other | Admitting: Obstetrics & Gynecology

## 2013-07-31 VITALS — BP 110/70 | Temp 98.0°F | Wt 211.1 lb

## 2013-07-31 DIAGNOSIS — N3091 Cystitis, unspecified with hematuria: Secondary | ICD-10-CM

## 2013-07-31 DIAGNOSIS — N3090 Cystitis, unspecified without hematuria: Secondary | ICD-10-CM

## 2013-07-31 DIAGNOSIS — O99330 Smoking (tobacco) complicating pregnancy, unspecified trimester: Secondary | ICD-10-CM

## 2013-07-31 DIAGNOSIS — E669 Obesity, unspecified: Secondary | ICD-10-CM

## 2013-07-31 DIAGNOSIS — IMO0002 Reserved for concepts with insufficient information to code with codable children: Secondary | ICD-10-CM

## 2013-07-31 DIAGNOSIS — N979 Female infertility, unspecified: Secondary | ICD-10-CM

## 2013-07-31 DIAGNOSIS — F17200 Nicotine dependence, unspecified, uncomplicated: Secondary | ICD-10-CM

## 2013-07-31 LAB — POCT URINALYSIS DIP (DEVICE)
Bilirubin Urine: NEGATIVE
Ketones, ur: NEGATIVE mg/dL
Nitrite: NEGATIVE

## 2013-07-31 LAB — GLUCOSE, CAPILLARY: Glucose-Capillary: 60 mg/dL — ABNORMAL LOW (ref 70–99)

## 2013-07-31 MED ORDER — RANITIDINE HCL 150 MG PO TABS: 150.0000 mg | ORAL_TABLET | Freq: Two times a day (BID) | ORAL | Status: DC

## 2013-07-31 NOTE — Progress Notes (Signed)
Seeing a hematologist for ITP.  Will get steroids in 3rd trimester to help with raising plt b/c pt desperately wants a ppBTL.  I sent a message to amy [facility]idy and [facility] to see if the anesthesiology department would be will ing to do general anesthesia for her BTL in case her plt her too low again for regional anesthesia.  If still a problem with the anesthesia dept, will check with [facility] and see if they are willing. Pt has tried OCPs and depo in the past.   Needs GCT an labs.  Pt wants to wait until next visit.  Fasting CBG is 60

## 2013-07-31 NOTE — Progress Notes (Signed)
Pulse: 82

## 2013-08-01 ENCOUNTER — Encounter: Payer: Self-pay | Admitting: *Deleted

## 2013-08-01 DIAGNOSIS — O99330 Smoking (tobacco) complicating pregnancy, unspecified trimester: Secondary | ICD-10-CM | POA: Insufficient documentation

## 2013-08-01 DIAGNOSIS — IMO0002 Reserved for concepts with insufficient information to code with codable children: Secondary | ICD-10-CM | POA: Insufficient documentation

## 2013-08-07 ENCOUNTER — Encounter: Payer: Self-pay | Admitting: *Deleted

## 2013-08-07 DIAGNOSIS — N979 Female infertility, unspecified: Secondary | ICD-10-CM | POA: Insufficient documentation

## 2013-08-07 NOTE — Progress Notes (Signed)
The following is from email from anesthesiology department:  Given that consensus on regional anesthesia and platelet counts is not achievable in the literature, the regional anesthesia community or elsewhere, it is not surprising that it remains unachievable here at [name]. We will all cross our fingers that [name] [name] comes for her delivery and PPBTL with a platelet count that is acceptable to the anesthesiologist who is here that day. If her platelet count is not acceptable for a spinal during her hospital stay, all of the [facility] anesthesiologists have agreed that she can have general anesthesia for a lap tubal at 4 weeks postpartum. That would put her well within her 5 weeks of maternity leave from work and certainly in a safer position physiologically than she would be immediately postpartum. Will see what [facility] and [place] can offer. [name]

## 2013-08-09 ENCOUNTER — Other Ambulatory Visit: Payer: Medicaid Other | Admitting: Lab

## 2013-08-09 ENCOUNTER — Ambulatory Visit: Payer: Medicaid Other | Admitting: Internal Medicine

## 2013-08-21 ENCOUNTER — Telehealth: Payer: Self-pay | Admitting: Internal Medicine

## 2013-08-21 ENCOUNTER — Ambulatory Visit (INDEPENDENT_AMBULATORY_CARE_PROVIDER_SITE_OTHER): Payer: Medicaid Other | Admitting: Family Medicine

## 2013-08-21 VITALS — BP 117/74 | Temp 97.4°F | Wt 210.0 lb

## 2013-08-21 DIAGNOSIS — D693 Immune thrombocytopenic purpura: Secondary | ICD-10-CM

## 2013-08-21 DIAGNOSIS — O99330 Smoking (tobacco) complicating pregnancy, unspecified trimester: Secondary | ICD-10-CM

## 2013-08-21 LAB — POCT URINALYSIS DIP (DEVICE)
Bilirubin Urine: NEGATIVE
Ketones, ur: NEGATIVE mg/dL
Leukocytes, UA: NEGATIVE
Nitrite: NEGATIVE
Protein, ur: NEGATIVE mg/dL
pH: 6 (ref 5.0–8.0)

## 2013-08-21 MED ORDER — TETANUS-DIPHTH-ACELL PERTUSSIS 5-2.5-18.5 LF-MCG/0.5 IM SUSP: 0.5000 mL | Freq: Once | INTRAMUSCULAR | Status: DC

## 2013-08-21 NOTE — Progress Notes (Signed)
Pulse: 93

## 2013-08-21 NOTE — Telephone Encounter (Signed)
returned pt call to r/s missed appt asap....r/s appt for tomorrow...lvm for pt with d/t.

## 2013-08-21 NOTE — Progress Notes (Signed)
Doing well 28 week labs today To f/u with Heme/Onc TDaP BTL Papers.

## 2013-08-21 NOTE — Patient Instructions (Signed)
Breastfeeding A change in hormones during your pregnancy causes growth of your breast tissue and an increase in number and size of milk ducts. The hormone prolactin allows proteins, sugars, and fats from your blood supply to make breast milk in your milk-producing glands. The hormone progesterone prevents breast milk from being released before the birth of your baby. After the birth of your baby, your progesterone level decreases allowing breast milk to be released. Thoughts of your baby, as well as his or her sucking or crying, can stimulate the release of milk from the milk-producing glands. Deciding to breastfeed (nurse) is one of the best choices you can make for you and your baby. The information that follows gives a brief review of the benefits, as well as other important skills to know about breastfeeding. BENEFITS OF BREASTFEEDING For your baby  The first milk (colostrum) helps your baby's digestive system function better.   There are antibodies in your milk that help your baby fight off infections.   Your baby has a lower incidence of asthma, allergies, and sudden infant death syndrome (SIDS).   The nutrients in breast milk are better for your baby than infant formulas.  Breast milk improves your baby's brain development.   Your baby will have less gas, colic, and constipation.  Your baby is less likely to develop other conditions, such as childhood obesity, asthma, or diabetes mellitus. For you  Breastfeeding helps develop a very special bond between you and your baby.   Breastfeeding is convenient, always available at the correct temperature, and costs nothing.   Breastfeeding helps to burn calories and helps you lose the weight gained during pregnancy.   Breastfeeding makes your uterus contract back down to normal size faster and slows bleeding following delivery.   Breastfeeding mothers have a lower risk of developing osteoporosis or breast or ovarian cancer later  in life.  BREASTFEEDING FREQUENCY  A healthy, full-term baby may breastfeed as often as every hour or space his or her feedings to every 3 hours. Breastfeeding frequency will vary from baby to baby.   Newborns should be fed no less than every 2 3 hours during the day and every 4 5 hours during the night. You should breastfeed a minimum of 8 feedings in a 24 hour period.  Awaken your baby to breastfeed if it has been 3 4 hours since the last feeding.  Breastfeed when you feel the need to reduce the fullness of your breasts or when your newborn shows signs of hunger. Signs that your baby may be hungry include:  Increased alertness or activity.  Stretching.  Movement of the head from side to side.  Movement of the head and opening of the mouth when the corner of the mouth or cheek is stroked (rooting).  Increased sucking sounds, smacking lips, cooing, sighing, or squeaking.  Hand-to-mouth movements.  Increased sucking of fingers or hands.  Fussing.  Intermittent crying.  Signs of extreme hunger will require calming and consoling before you try to feed your baby. Signs of extreme hunger may include:  Restlessness.  A loud, strong cry.  Screaming.  Frequent feeding will help you make more milk and will help prevent problems, such as sore nipples and engorgement of the breasts.  BREASTFEEDING   Whether lying down or sitting, be sure that the baby's abdomen is facing your abdomen.   Support your breast with 4 fingers under your breast and your thumb above your nipple. Make sure your fingers are well away from   your nipple and your baby's mouth.   Stroke your baby's lips gently with your finger or nipple.   When your baby's mouth is open wide enough, place all of your nipple and as much of the colored area around your nipple (areola) as possible into your baby's mouth.  More areola should be visible above his or her upper lip than below his or her lower lip.  Your  baby's tongue should be between his or her lower gum and your breast.  Ensure that your baby's mouth is correctly positioned around the nipple (latched). Your baby's lips should create a seal on your breast.  Signs that your baby has effectively latched onto your nipple include:  Tugging or sucking without pain.  Swallowing heard between sucks.  Absent click or smacking sound.  Muscle movement above and in front of his or her ears with sucking.  Your baby must suck about 2 3 minutes in order to get your milk. Allow your baby to feed on each breast as long as he or she wants. Nurse your baby until he or she unlatches or falls asleep at the first breast, then offer the second breast.  Signs that your baby is full and satisfied include:  A gradual decrease in the number of sucks or complete cessation of sucking.  Falling asleep.  Extension or relaxation of his or her body.  Retention of a small amount of milk in his or her mouth.  Letting go of your breast by himself or herself.  Signs of effective breastfeeding in you include:  Breasts that have increased firmness, weight, and size prior to feeding.  Breasts that are softer after nursing.  Increased milk volume, as well as a change in milk consistency and color by the 5th day of breastfeeding.  Breast fullness relieved by breastfeeding.  Nipples are not sore, cracked, or bleeding.  If needed, break the suction by putting your finger into the corner of your baby's mouth and sliding your finger between his or her gums. Then, remove your breast from his or her mouth.  It is common for babies to spit up a small amount after a feeding.  Babies often swallow air during feeding. This can make babies fussy. Burping your baby between breasts can help with this.  Vitamin D supplements are recommended for babies who get only breast milk.  Avoid using a pacifier during your baby's first 4 6 weeks.  Avoid supplemental feedings of  water, formula, or juice in place of breastfeeding. Breast milk is all the food your baby needs. It is not necessary for your baby to have water or formula. Your breasts will make more milk if supplemental feedings are avoided during the early weeks. HOW TO TELL WHETHER YOUR BABY IS GETTING ENOUGH BREAST MILK Wondering whether or not your baby is getting enough milk is a common concern among mothers. You can be assured that your baby is getting enough milk if:   Your baby is actively sucking and you hear swallowing.   Your baby seems relaxed and satisfied after a feeding.   Your baby nurses at least 8 12 times in a 24 hour time period.  During the first 3 5 days of age:  Your baby is wetting at least 3 5 diapers in a 24 hour period. The urine should be clear and pale yellow.  Your baby is having at least 3 4 stools in a 24 hour period. The stool should be soft and yellow.  At   5 7 days of age, your baby is having at least 3 6 stools in a 24 hour period. The stool should be seedy and yellow by 5 days of age.  Your baby has a weight loss less than 7 10% during the first 3 days of age.  Your baby does not lose weight after 3 7 days of age.  Your baby gains 4 7 ounces each week after he or she is 4 days of age.  Your baby gains weight by 5 days of age and is back to birth weight within 2 weeks. ENGORGEMENT In the first week after your baby is born, you may experience extremely full breasts (engorgement). When engorged, your breasts may feel heavy, warm, or tender to the touch. Engorgement peaks within 24 48 hours after delivery of your baby.  Engorgement may be reduced by:  Continuing to breastfeed.  Increasing the frequency of breastfeeding.  Taking warm showers or applying warm, moist heat to your breasts just before each feeding. This increases circulation and helps the milk flow.   Gently massaging your breast before and during the feedings. With your fingertips, massage from  your chest wall towards your nipple in a circular motion.   Ensuring that your baby empties at least one breast at every feeding. It also helps to start the next feeding on the opposite breast.   Expressing breast milk by hand or by using a breast pump to empty the breasts if your baby is sleepy, or not nursing well. You may also want to express milk if you are returning to work oryou feel you are getting engorged.  Ensuring your baby is latched on and positioned properly while breastfeeding. If you follow these suggestions, your engorgement should improve in 24 48 hours. If you are still experiencing difficulty, call your lactation consultant or caregiver.  CARING FOR YOURSELF Take care of your breasts.  Bathe or shower daily.   Avoid using soap on your nipples.   Wear a supportive bra. Avoid wearing underwire style bras.  Air dry your nipples for a 3 4minutes after each feeding.   Use only cotton bra pads to absorb breast milk leakage. Leaking of breast milk between feedings is normal.   Use only pure lanolin on your nipples after nursing. You do not need to wash it off before feeding your baby again. Another option is to express a few drops of breast milk and gently massage that milk into your nipples.  Continue breast self-awareness checks. Take care of yourself.  Eat healthy foods. Alternate 3 meals with 3 snacks.  Avoid foods that you notice affect your baby in a bad way.  Drink milk, fruit juice, and water to satisfy your thirst (about 8 glasses a day).   Rest often, relax, and take your prenatal vitamins to prevent fatigue, stress, and anemia.  Avoid chewing and smoking tobacco.  Avoid alcohol and drug use.  Take over-the-counter and prescribed medicine only as directed by your caregiver or pharmacist. You should always check with your caregiver or pharmacist before taking any new medicine, vitamin, or herbal supplement.  Know that pregnancy is possible while  breastfeeding. If desired, talk to your caregiver about family planning and safe birth control methods that may be used while breastfeeding. SEEK MEDICAL CARE IF:   You feel like you want to stop breastfeeding or have become frustrated with breastfeeding.  You have painful breasts or nipples.  Your nipples are cracked or bleeding.  Your breasts are red, tender,   or warm.  You have a swollen area on either breast.  You have a fever or chills.  You have nausea or vomiting.  You have drainage from your nipples.  Your breasts do not become full before feedings by the 5th day after delivery.  You feel sad and depressed.  Your baby is too sleepy to eat well.  Your baby is having trouble sleeping.   Your baby is wetting less than 3 diapers in a 24 hour period.  Your baby has less than 3 stools in a 24 hour period.  Your baby's skin or the white part of his or her eyes becomes more yellow.   Your baby is not gaining weight by 5 days of age. MAKE SURE YOU:   Understand these instructions.  Will watch your condition.  Will get help right away if you are not doing well or get worse. Document Released: 12/14/2005 Document Revised: 09/07/2012 Document Reviewed: 07/20/2012 [employer] Patient Information 2014 [employer].  

## 2013-08-22 ENCOUNTER — Other Ambulatory Visit: Payer: Self-pay | Admitting: *Deleted

## 2013-08-22 ENCOUNTER — Other Ambulatory Visit: Payer: Medicaid Other | Admitting: Lab

## 2013-08-22 ENCOUNTER — Telehealth: Payer: Self-pay | Admitting: Internal Medicine

## 2013-08-22 ENCOUNTER — Ambulatory Visit: Payer: Medicaid Other | Admitting: Internal Medicine

## 2013-08-22 ENCOUNTER — Encounter: Payer: Self-pay | Admitting: Family Medicine

## 2013-08-22 LAB — CBC
MCH: 28.9 pg (ref 26.0–34.0)
MCV: 82.2 fL (ref 78.0–100.0)
Platelets: 82 10*3/uL — ABNORMAL LOW (ref 150–400)
RBC: 3.87 MIL/uL (ref 3.87–5.11)
RDW: 14.3 % (ref 11.5–15.5)
WBC: 9.8 10*3/uL (ref 4.0–10.5)

## 2013-08-22 LAB — GLUCOSE TOLERANCE, 1 HOUR (50G) W/O FASTING: Glucose, 1 Hour GTT: 68 mg/dL — ABNORMAL LOW (ref 70–140)

## 2013-08-22 LAB — HIV ANTIBODY (ROUTINE TESTING W REFLEX): HIV: NONREACTIVE

## 2013-08-22 NOTE — Telephone Encounter (Signed)
s.w. pt and r/s to d/t that was convinenient for pt....pt ok and aware

## 2013-09-04 ENCOUNTER — Encounter: Payer: Medicaid Other | Admitting: Family Medicine

## 2013-09-05 ENCOUNTER — Telehealth: Payer: Self-pay | Admitting: Internal Medicine

## 2013-09-05 ENCOUNTER — Ambulatory Visit (HOSPITAL_BASED_OUTPATIENT_CLINIC_OR_DEPARTMENT_OTHER): Payer: Medicaid Other | Admitting: Internal Medicine

## 2013-09-05 ENCOUNTER — Other Ambulatory Visit (HOSPITAL_BASED_OUTPATIENT_CLINIC_OR_DEPARTMENT_OTHER): Payer: Medicaid Other | Admitting: Lab

## 2013-09-05 ENCOUNTER — Encounter: Payer: Self-pay | Admitting: Internal Medicine

## 2013-09-05 VITALS — BP 116/76 | HR 96 | Temp 97.3°F | Resp 20 | Ht 66.0 in | Wt 211.5 lb

## 2013-09-05 DIAGNOSIS — D693 Immune thrombocytopenic purpura: Secondary | ICD-10-CM

## 2013-09-05 LAB — CBC WITH DIFFERENTIAL/PLATELET
Basophils Absolute: 0 10*3/uL (ref 0.0–0.1)
EOS%: 0.7 % (ref 0.0–7.0)
Eosinophils Absolute: 0.1 10*3/uL (ref 0.0–0.5)
HGB: 11.1 g/dL — ABNORMAL LOW (ref 11.6–15.9)
LYMPH%: 20.8 % (ref 14.0–49.7)
MCH: 29 pg (ref 25.1–34.0)
MCV: 84.6 fL (ref 79.5–101.0)
MONO%: 5.9 % (ref 0.0–14.0)
NEUT#: 5.7 10*3/uL (ref 1.5–6.5)
NEUT%: 72.1 % (ref 38.4–76.8)
Platelets: 55 10*3/uL — ABNORMAL LOW (ref 145–400)

## 2013-09-05 NOTE — Telephone Encounter (Signed)
gv and printed appt sched and avs for pt for OCT. °

## 2013-09-05 NOTE — Patient Instructions (Signed)
Followup visit in 5 weeks with repeat CBC

## 2013-09-05 NOTE — Progress Notes (Signed)
[facility] [phone]   [phone]  OFFICE PROGRESS NOTE  No PCP Per Patient [address] [place] [address]  DIAGNOSIS: Pregnant lady with history of ITP   PRIOR THERAPY: None   CURRENT THERAPY: Observation  INTERVAL HISTORY: [name] [name] 27 y.o. female returns to the clinic today for followup visit. The patient is feeling fine with no specific complaints except for mild fatigue for her current pregnancy. She is currently [redacted] weeks pregnant. She denied having any bleeding issues, bruises or ecchymosis. She has no significant chest pain, shortness of breath, cough or hemoptysis. She had repeat CBC performed earlier today and she is here for evaluation and discussion of her lab results.  MEDICAL HISTORY: Past Medical History  Diagnosis Date  . ITP (idiopathic thrombocytopenic purpura)   . ITP (idiopathic thrombocytopenic purpura)     ALLERGIES:  has No Known Allergies.  MEDICATIONS:  Current Outpatient Prescriptions  Medication Sig Dispense Refill  . butalbital-acetaminophen-caffeine (ESGIC PLUS) 50-500-40 MG per tablet Take 1-2 tablets by mouth every 4 (four) hours as needed for pain or headache.  30 tablet  1  . Ca Carbonate-Mag Hydroxide (ROLAIDS PO) Take 1 tablet by mouth daily as needed (heartburn).      . Prenatal Vit-Fe Fumarate-FA (PRENATAL MULTIVITAMIN) TABS Take 1 tablet by mouth daily at 12 noon.      . ranitidine (ZANTAC) 150 MG tablet Take 1 tablet (150 mg total) by mouth 2 (two) times daily.  60 tablet  3  . acetaminophen (TYLENOL) 500 MG tablet Take 1,000 mg by mouth daily as needed for pain or fever.       Current Facility-Administered Medications  Medication Dose Route Frequency Provider Last Rate Last Dose  . TDaP (BOOSTRIX) injection 0.5 mL  0.5 mL Intramuscular Once Reva Bores, MD        SURGICAL HISTORY:  Past Surgical History  Procedure Laterality Date  . Surgical repair to wrist      REVIEW OF  SYSTEMS:  A comprehensive review of systems was negative.   PHYSICAL EXAMINATION: General appearance: alert, cooperative and no distress Head: Normocephalic, without obvious abnormality, atraumatic Neck: no adenopathy Lymph nodes: Cervical, supraclavicular, and axillary nodes normal. Resp: clear to auscultation bilaterally Cardio: regular rate and rhythm, S1, S2 normal, no murmur, click, rub or gallop GI: soft, non-tender; bowel sounds normal; no masses,  no organomegaly and Distended corresponding to her current pregnancy Extremities: extremities normal, atraumatic, no cyanosis or edema  ECOG PERFORMANCE STATUS: 0 - Asymptomatic  Blood pressure 116/76, pulse 96, temperature 97.3 F (36.3 C), temperature source Oral, resp. rate 20, height 5\' 6"  (1.676 m), weight 211 lb 8 oz (95.936 kg), last menstrual period 12/11/2012.  LABORATORY DATA: Lab Results  Component Value Date   WBC 7.9 09/05/2013   HGB 11.1* 09/05/2013   HCT 32.4* 09/05/2013   MCV 84.6 09/05/2013   PLT 55* 09/05/2013      Chemistry      Component Value Date/Time   NA 136 06/12/2013 1448   NA 136 06/05/2013 1030   K 3.4* 06/12/2013 1448   K 3.5 06/05/2013 1030   CL 108* 06/12/2013 1448   CL 103 06/05/2013 1030   CO2 19* 06/12/2013 1448   CO2 25 06/05/2013 1030   BUN 6.6* 06/12/2013 1448   BUN 8 06/05/2013 1030   CREATININE 0.6 06/12/2013 1448   CREATININE 0.62 06/07/2013 1037   CREATININE 0.62 06/05/2013 1030   CREATININE  0.61 04/09/2013 0915      Component Value Date/Time   CALCIUM 8.7 06/12/2013 1448   CALCIUM 8.9 06/05/2013 1030   ALKPHOS 59 06/12/2013 1448   ALKPHOS 52 06/05/2013 1030   AST 14 06/12/2013 1448   AST 15 06/05/2013 1030   ALT 15 06/12/2013 1448   ALT 15 06/05/2013 1030   BILITOT 0.23 06/12/2013 1448   BILITOT 0.2* 06/05/2013 1030       RADIOGRAPHIC STUDIES: No results found.  ASSESSMENT AND PLAN: This is a very pleasant 27 years old female who is currently [redacted] week pregnant with history of ITP. Her platelets count are  low today to 55,000 but the patient is asymptomatic. The decline in her platelets count is partially secondary to her ITP plus/minus thrombocytopenia of pregnancy secondary to dilution effect. I will continue to monitor the patient closely with repeat CBC in 5 weeks. I may consider the patient for a dose of 1-2 weeks off prednisone immediately before her expected date of delivery with the hope to improve her platelets count to over 80,000 before delivery. The patient was advised to call immediately if she has any concerning symptoms in the interval.  The patient voices understanding of current disease status and treatment options and is in agreement with the current care plan.  All questions were answered. The patient knows to call the clinic with any problems, questions or concerns. We can certainly see the patient much sooner if necessary.

## 2013-09-11 ENCOUNTER — Encounter: Payer: Self-pay | Admitting: *Deleted

## 2013-09-16 ENCOUNTER — Encounter (HOSPITAL_COMMUNITY): Payer: Self-pay

## 2013-09-16 ENCOUNTER — Inpatient Hospital Stay (HOSPITAL_COMMUNITY)
Admission: AD | Admit: 2013-09-16 | Discharge: 2013-09-16 | Disposition: A | Discharge status: Home / Self Care | Payer: Medicaid Other | Source: Ambulatory Visit | Attending: Obstetrics and Gynecology | Admitting: Obstetrics and Gynecology

## 2013-09-16 DIAGNOSIS — L03011 Cellulitis of right finger: Secondary | ICD-10-CM

## 2013-09-16 DIAGNOSIS — L03019 Cellulitis of unspecified finger: Secondary | ICD-10-CM

## 2013-09-16 DIAGNOSIS — O99891 Other specified diseases and conditions complicating pregnancy: Secondary | ICD-10-CM | POA: Insufficient documentation

## 2013-09-16 DIAGNOSIS — M79609 Pain in unspecified limb: Secondary | ICD-10-CM | POA: Insufficient documentation

## 2013-09-16 MED ORDER — CEPHALEXIN 500 MG PO CAPS: 500.0000 mg | ORAL_CAPSULE | Freq: Four times a day (QID) | ORAL | Status: DC

## 2013-09-16 NOTE — MAU Note (Signed)
Pt states finger may be infected from hangnail.

## 2013-09-16 NOTE — MAU Note (Signed)
Dr Reola Calkins drained paronychia

## 2013-09-16 NOTE — MAU Provider Note (Signed)
Chief Complaint:  No chief complaint on file.   [name] [name] is a 27 y.o.  [id number] with IUP at [redacted]w[redacted]d presenting for infected finger. States has had 2 weeks of worsening right middle finger pain.  Now having some pus building but can't get it to drain.   No fevers, chills, rashes, skin changes, vomiting.   PNC at [facility] has been uncomplicated. Has a hx of ITP.      Menstrual History: OB History   Grav Para Term Preterm Abortions TAB SAB Ect Mult Living   11 6 6  0 4 2 1 1  6     G1-G6: full term, NSVD   Patient's last menstrual period was 12/11/2012.      Past Medical History  Diagnosis Date  . ITP (idiopathic thrombocytopenic purpura)   . ITP (idiopathic thrombocytopenic purpura)     Past Surgical History  Procedure Laterality Date  . Surgical repair to wrist      Family History  Problem Relation Age of Onset  . Cancer Father     leukemia  . Fibroids Mother   . Thrombocytopenia Brother     ITP    History  Substance Use Topics  . Smoking status: Former Smoker -- 0.50 packs/day for 10 years    Types: Cigarettes    Quit date: 04/12/2013  . Smokeless tobacco: Not on file  . Alcohol Use: No     No Known Allergies  Facility-administered medications prior to admission  Medication Dose Route Frequency Provider Last Rate Last Dose  . TDaP (BOOSTRIX) injection 0.5 mL  0.5 mL Intramuscular Once Reva Bores, MD       Prescriptions prior to admission  Medication Sig Dispense Refill  . acetaminophen (TYLENOL) 500 MG tablet Take 1,000 mg by mouth daily as needed for pain or fever.      . butalbital-acetaminophen-caffeine (ESGIC PLUS) 50-500-40 MG per tablet Take 1-2 tablets by mouth every 4 (four) hours as needed for pain or headache.  30 tablet  1  . Ca Carbonate-Mag Hydroxide (ROLAIDS PO) Take 1 tablet by mouth daily as needed (heartburn).      . Prenatal Vit-Fe Fumarate-FA (PRENATAL MULTIVITAMIN) TABS Take 1 tablet by mouth daily at 12 noon.      . ranitidine  (ZANTAC) 150 MG tablet Take 1 tablet (150 mg total) by mouth 2 (two) times daily.  60 tablet  3    Review of Systems - Negative except for what is mentioned in HPI.  Physical Exam  Blood pressure 117/68, pulse 88, temperature 97.8 F (36.6 C), temperature source Oral, resp. rate 18, height 5\' 7"  (1.702 m), weight 98.975 kg (218 lb 3.2 oz), last menstrual period 12/11/2012, SpO2 99.00%. GENERAL: Well-developed, well-nourished female in no acute distress.  PULM: good effort CV: RRR EXTREMITIES: Nontender, no edema, 2+ distal pulses.  Right middle finger with medial aspect erythematous, swollen and purulent with fluctuance. No drainage.  FHT:  Baseline rate 135 bpm   Variability moderate  Accelerations present 10x10   Decelerations none Contractions: quiet   Labs: No results found for this or any previous visit (from the past 24 hour(s)).  Imaging Studies:  No results found.  Assessment: [name] [name] is  27 y.o. [id number] at [redacted]w[redacted]d presents with No chief complaint on file. .  Plan: 1) acute paronychia on right middle finger - drained as below - some mild warmth and erythema so will treat with keflex x 7 days also - warm antibacterial soaks - pt with significant  pain relief after procedure - f/u in clinic on Monday as schedule  2) FWB - cat I tracing.    PROCEDURE NOTE:   Verbal consent obtained. Area of right middle finger was cleaned with alcohol x 2.  An 18G needle was used to puncture the skin with return of significant purulent drainage.  Paronychia drained until only blood remained.  A bandage was applied. Pt tolerated the procedure well.    Hershal Eriksson L 9/20/20146:12 AM

## 2013-09-18 ENCOUNTER — Ambulatory Visit (INDEPENDENT_AMBULATORY_CARE_PROVIDER_SITE_OTHER): Payer: Medicaid Other | Admitting: Obstetrics & Gynecology

## 2013-09-18 VITALS — BP 127/76 | Wt 212.8 lb

## 2013-09-18 DIAGNOSIS — O0943 Supervision of pregnancy with grand multiparity, third trimester: Secondary | ICD-10-CM

## 2013-09-18 DIAGNOSIS — O09899 Supervision of other high risk pregnancies, unspecified trimester: Secondary | ICD-10-CM

## 2013-09-18 LAB — POCT URINALYSIS DIP (DEVICE)
Hgb urine dipstick: NEGATIVE
Nitrite: NEGATIVE
Protein, ur: NEGATIVE mg/dL
Specific Gravity, Urine: >=1.03 (ref 1.005–1.030)
Urobilinogen, UA: 0.2 mg/dL (ref 0.0–1.0)
pH: 5.5 (ref 5.0–8.0)

## 2013-09-18 NOTE — Progress Notes (Signed)
Pulse- 94 

## 2013-09-18 NOTE — Patient Instructions (Signed)

## 2013-09-18 NOTE — Progress Notes (Signed)
Platelets 55 2 weeks ago, will receive treatment before  Delivery. Perionychia drained last week, on abx, still red but better

## 2013-09-19 NOTE — MAU Provider Note (Signed)
Attestation of Attending Supervision of Advanced Practitioner: Evaluation and management procedures were performed by the PA/NP/CNM/OB Fellow under my supervision/collaboration. Chart reviewed and agree with management and plan.  Giulliana Mcroberts V 09/19/2013 7:54 AM

## 2013-09-21 ENCOUNTER — Encounter: Payer: Self-pay | Admitting: *Deleted

## 2013-10-02 ENCOUNTER — Ambulatory Visit (INDEPENDENT_AMBULATORY_CARE_PROVIDER_SITE_OTHER): Payer: Medicaid Other | Admitting: Advanced Practice Midwife

## 2013-10-02 VITALS — BP 102/69 | Temp 98.3°F | Wt 214.7 lb

## 2013-10-02 DIAGNOSIS — D693 Immune thrombocytopenic purpura: Secondary | ICD-10-CM

## 2013-10-02 DIAGNOSIS — E669 Obesity, unspecified: Secondary | ICD-10-CM

## 2013-10-02 LAB — POCT URINALYSIS DIP (DEVICE)
Bilirubin Urine: NEGATIVE
Glucose, UA: NEGATIVE mg/dL
Hgb urine dipstick: NEGATIVE
Ketones, ur: NEGATIVE mg/dL
Leukocytes, UA: NEGATIVE
Nitrite: NEGATIVE

## 2013-10-02 NOTE — Progress Notes (Signed)
Pulse- 87 Patient reports lower abdominal/pelvic pain & pressure

## 2013-10-02 NOTE — Patient Instructions (Signed)

## 2013-10-02 NOTE — Progress Notes (Signed)
Doing well. Sees hematologist in a couple of weeks. He wants to know date of IOL. I told her probably not before 39 wks, Nov 2.  He wants to treat with steroids but then stop them before birth. Having irregular painful contractions. Plan cultures next week, declines today

## 2013-10-03 ENCOUNTER — Encounter: Payer: Self-pay | Admitting: *Deleted

## 2013-10-09 ENCOUNTER — Ambulatory Visit (INDEPENDENT_AMBULATORY_CARE_PROVIDER_SITE_OTHER): Payer: Medicaid Other | Admitting: Obstetrics and Gynecology

## 2013-10-09 VITALS — BP 116/74 | Temp 97.0°F | Wt 216.3 lb

## 2013-10-09 DIAGNOSIS — E669 Obesity, unspecified: Secondary | ICD-10-CM

## 2013-10-09 DIAGNOSIS — D693 Immune thrombocytopenic purpura: Secondary | ICD-10-CM

## 2013-10-09 LAB — OB RESULTS CONSOLE GBS: GBS: NEGATIVE

## 2013-10-09 LAB — POCT URINALYSIS DIP (DEVICE): Specific Gravity, Urine: >=1.03 (ref 1.005–1.030)

## 2013-10-09 NOTE — Patient Instructions (Signed)

## 2013-10-09 NOTE — Progress Notes (Signed)
U/S scheduled 10/11/13 at 1015 am.

## 2013-10-09 NOTE — Progress Notes (Signed)
Has Hem appt in 1 day to start steroids. C/W Dr. Macon Large does not need IOL. UCs mild irreg. S>D so will get [place] interval growth and AFI.

## 2013-10-09 NOTE — Progress Notes (Signed)
P= 86 C/o of intermittent lower abdomen/pelvic pain and pressure.

## 2013-10-10 ENCOUNTER — Ambulatory Visit (HOSPITAL_BASED_OUTPATIENT_CLINIC_OR_DEPARTMENT_OTHER): Payer: Medicaid Other | Admitting: Internal Medicine

## 2013-10-10 ENCOUNTER — Other Ambulatory Visit (HOSPITAL_BASED_OUTPATIENT_CLINIC_OR_DEPARTMENT_OTHER): Payer: Medicaid Other | Admitting: Lab

## 2013-10-10 ENCOUNTER — Encounter: Payer: Self-pay | Admitting: Internal Medicine

## 2013-10-10 VITALS — BP 114/63 | HR 88 | Temp 98.0°F | Resp 18 | Ht 67.0 in | Wt 218.0 lb

## 2013-10-10 DIAGNOSIS — D509 Iron deficiency anemia, unspecified: Secondary | ICD-10-CM | POA: Insufficient documentation

## 2013-10-10 DIAGNOSIS — D693 Immune thrombocytopenic purpura: Secondary | ICD-10-CM

## 2013-10-10 LAB — CBC WITH DIFFERENTIAL/PLATELET
BASO%: 0.4 % (ref 0.0–2.0)
Basophils Absolute: 0 10*3/uL (ref 0.0–0.1)
Eosinophils Absolute: 0.1 10*3/uL (ref 0.0–0.5)
HGB: 10.3 g/dL — ABNORMAL LOW (ref 11.6–15.9)
LYMPH%: 22.7 % (ref 14.0–49.7)
MCHC: 34 g/dL (ref 31.5–36.0)
MONO#: 0.6 10*3/uL (ref 0.1–0.9)
RBC: 3.64 10*6/uL — ABNORMAL LOW (ref 3.70–5.45)
RDW: 14.7 % — ABNORMAL HIGH (ref 11.2–14.5)
WBC: 8.3 10*3/uL (ref 3.9–10.3)
lymph#: 1.9 10*3/uL (ref 0.9–3.3)

## 2013-10-10 LAB — LACTATE DEHYDROGENASE (CC13): LDH: 187 U/L (ref 125–245)

## 2013-10-10 LAB — GC/CHLAMYDIA PROBE AMP: GC Probe RNA: NEGATIVE

## 2013-10-10 MED ORDER — PREDNISONE 20 MG PO TABS: ORAL_TABLET | ORAL | Status: DC

## 2013-10-10 NOTE — Patient Instructions (Signed)
Start prednisone one week before delivery. Follow up visit in 2 months with repeat CBC and iron study.

## 2013-10-10 NOTE — Progress Notes (Signed)
[facility] [phone]   [phone]  OFFICE PROGRESS NOTE   DIAGNOSIS: Pregnant lady with history of ITP   PRIOR THERAPY: None   CURRENT THERAPY: Observation  INTERVAL HISTORY: [name] [name] 27 y.o. female returns to the clinic today for follow up visit. The patient is feeling fine today with no specific complaints except for fatigue. She is currently [redacted] weeks pregnant. She is expected to have induction of labor on 10/29/2013. She is not expected to have epidural for her delivery as she told me today. The patient denied having any significant chest pain, shortness breath, cough or hemoptysis. She denied having any bleeding issues. She denied having any bruises or ecchymosis. She had repeat CBC performed earlier today and she is here for evaluation and discussion of her lab results.  MEDICAL HISTORY: Past Medical History  Diagnosis Date  . ITP (idiopathic thrombocytopenic purpura)   . ITP (idiopathic thrombocytopenic purpura)     ALLERGIES:  has No Known Allergies.  MEDICATIONS:  Current Outpatient Prescriptions  Medication Sig Dispense Refill  . Prenatal Vit-Fe Fumarate-FA (PRENATAL MULTIVITAMIN) TABS Take 1 tablet by mouth daily at 12 noon.      . ranitidine (ZANTAC) 150 MG tablet Take 1 tablet (150 mg total) by mouth 2 (two) times daily.  60 tablet  3   Current Facility-Administered Medications  Medication Dose Route Frequency Provider Last Rate Last Dose  . TDaP (BOOSTRIX) injection 0.5 mL  0.5 mL Intramuscular Once Reva Bores, MD        SURGICAL HISTORY:  Past Surgical History  Procedure Laterality Date  . Surgical repair to wrist      REVIEW OF SYSTEMS:  A comprehensive review of systems was negative except for: Constitutional: positive for fatigue   PHYSICAL EXAMINATION: General appearance: alert, cooperative, fatigued and no distress Head: Normocephalic, without obvious abnormality, atraumatic Neck: no adenopathy, no  JVD, supple, symmetrical, trachea midline and thyroid not enlarged, symmetric, no tenderness/mass/nodules Lymph nodes: Cervical, supraclavicular, and axillary nodes normal. Resp: clear to auscultation bilaterally Cardio: regular rate and rhythm, S1, S2 normal, no murmur, click, rub or gallop GI: soft, non-tender; bowel sounds normal; no masses,  no organomegaly Extremities: extremities normal, atraumatic, no cyanosis or edema  ECOG PERFORMANCE STATUS: 1 - Symptomatic but completely ambulatory  Blood pressure 114/63, pulse 88, temperature 98 F (36.7 C), temperature source Oral, resp. rate 18, height 5\' 7"  (1.702 m), weight 218 lb (98.884 kg), last menstrual period 12/11/2012.  LABORATORY DATA: Lab Results  Component Value Date   WBC 8.3 10/10/2013   HGB 10.3* 10/10/2013   HCT 30.2* 10/10/2013   MCV 82.8 10/10/2013   PLT 62* 10/10/2013      Chemistry      Component Value Date/Time   NA 136 06/12/2013 1448   NA 136 06/05/2013 1030   K 3.4* 06/12/2013 1448   K 3.5 06/05/2013 1030   CL 108* 06/12/2013 1448   CL 103 06/05/2013 1030   CO2 19* 06/12/2013 1448   CO2 25 06/05/2013 1030   BUN 6.6* 06/12/2013 1448   BUN 8 06/05/2013 1030   CREATININE 0.6 06/12/2013 1448   CREATININE 0.62 06/07/2013 1037   CREATININE 0.62 06/05/2013 1030   CREATININE 0.61 04/09/2013 0915      Component Value Date/Time   CALCIUM 8.7 06/12/2013 1448   CALCIUM 8.9 06/05/2013 1030   ALKPHOS 59 06/12/2013 1448   ALKPHOS 52 06/05/2013 1030   AST 14 06/12/2013 1448  AST 15 06/05/2013 1030   ALT 15 06/12/2013 1448   ALT 15 06/05/2013 1030   BILITOT 0.23 06/12/2013 1448   BILITOT 0.2* 06/05/2013 1030       RADIOGRAPHIC STUDIES: No results found.  ASSESSMENT AND PLAN: this is a very pleasant 27 years old white female who is currently [redacted] weeks pregnant with history of ITP. The patient is doing fine today and has no bleeding, bruises or ecchymosis. Her platelets count of 62,000. I recommended for the patient to start prednisone  60 mg by mouth daily 7 days before her expected induction of labor with the hope to increase her platelets count 70,000-80,000 but the patient is also expected to do well even if her platelets count continues to be in the range of 50,000-60,000 at the time of delivery as long she is not expected to have epidural anesthesia. Her prednisone will be tapered to 40 mg for 3 days after delivery followed by 20 mg for another 3 days then discontinue. I will arrange for the patient to come back for follow up visit in 2 months for reevaluation with repeat CBC and iron study. Her newborn baby would need to be evaluated for thrombocytopenia after delivery. The patient was advised to call immediately if she has any concerning symptoms in the interval. The patient voices understanding of current disease status and treatment options and is in agreement with the current care plan.  All questions were answered. The patient knows to call the clinic with any problems, questions or concerns. We can certainly see the patient much sooner if necessary.

## 2013-10-11 ENCOUNTER — Ambulatory Visit (HOSPITAL_COMMUNITY): Admission: RE | Admit: 2013-10-11 | Payer: Medicaid Other | Source: Ambulatory Visit

## 2013-10-11 ENCOUNTER — Telehealth: Payer: Self-pay | Admitting: Internal Medicine

## 2013-10-11 ENCOUNTER — Ambulatory Visit (HOSPITAL_COMMUNITY)
Admission: RE | Admit: 2013-10-11 | Discharge: 2013-10-11 | Disposition: A | Discharge status: Home / Self Care | Payer: Medicaid Other | Source: Ambulatory Visit | Attending: Obstetrics and Gynecology | Admitting: Obstetrics and Gynecology

## 2013-10-11 ENCOUNTER — Telehealth: Payer: Self-pay

## 2013-10-11 DIAGNOSIS — D689 Coagulation defect, unspecified: Secondary | ICD-10-CM | POA: Insufficient documentation

## 2013-10-11 DIAGNOSIS — D693 Immune thrombocytopenic purpura: Secondary | ICD-10-CM | POA: Insufficient documentation

## 2013-10-11 DIAGNOSIS — O3660X0 Maternal care for excessive fetal growth, unspecified trimester, not applicable or unspecified: Secondary | ICD-10-CM | POA: Insufficient documentation

## 2013-10-11 IMAGING — US US OB FOLLOW-UP
1 series · 12 of 28 positions shown · non-contrast
Comparison: none

[Series 1: us ob follow up · 12 of 42 slices shown]
[im 2/42]
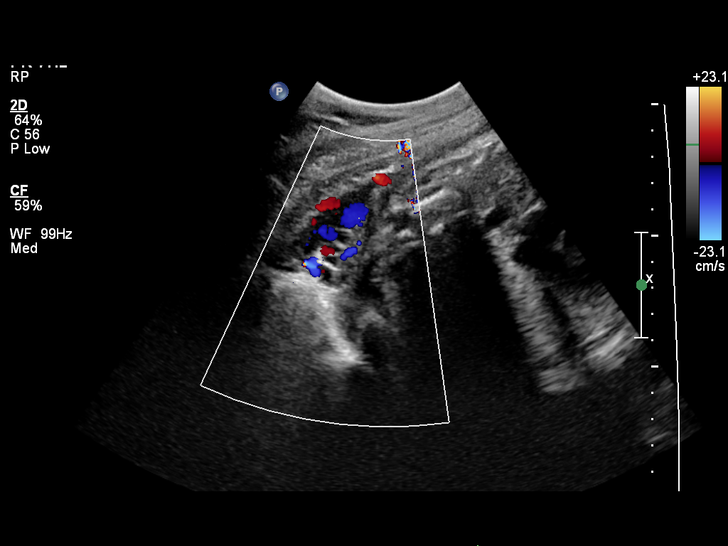
[im 5/42]
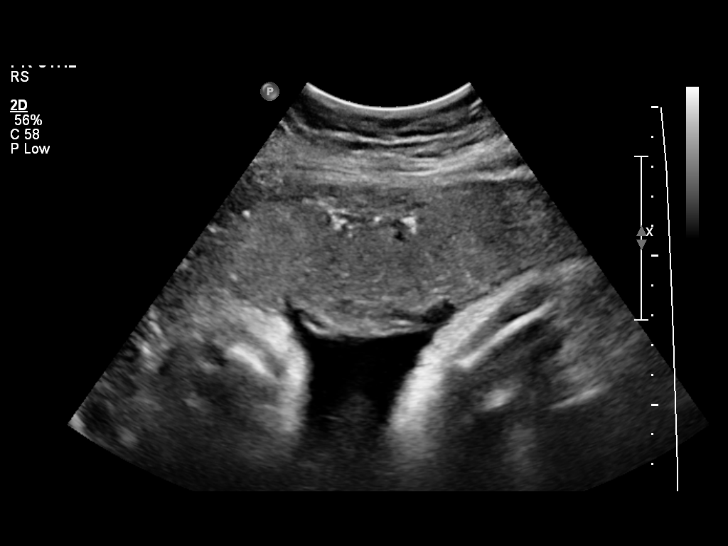
[im 8/42]
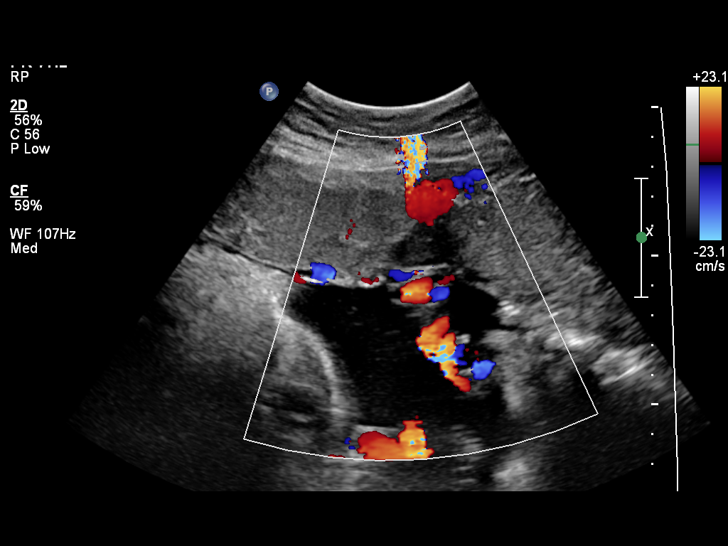
[im 13/42]
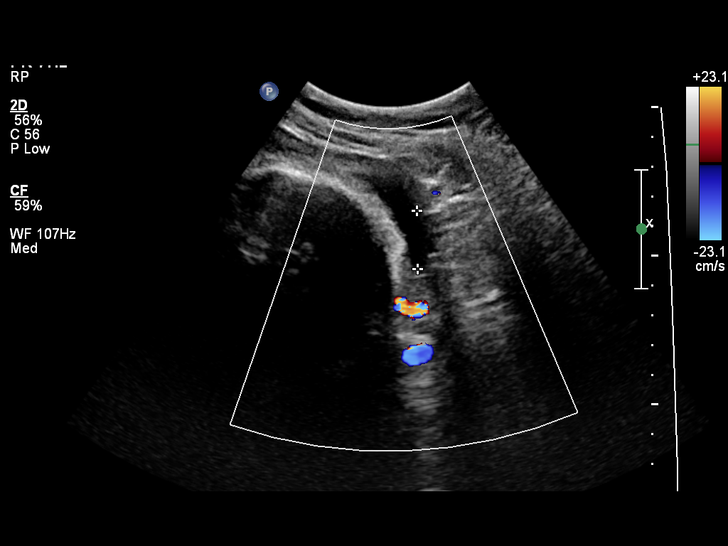
[im 16/42]
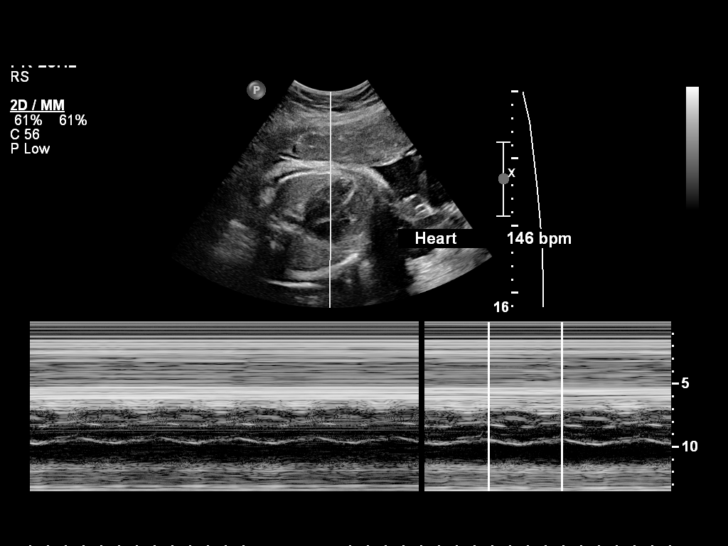
[im 19/42]
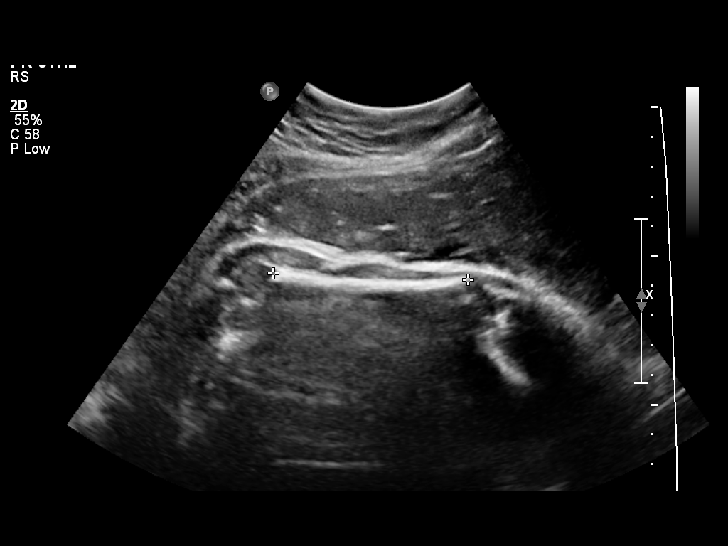
[im 23/42]
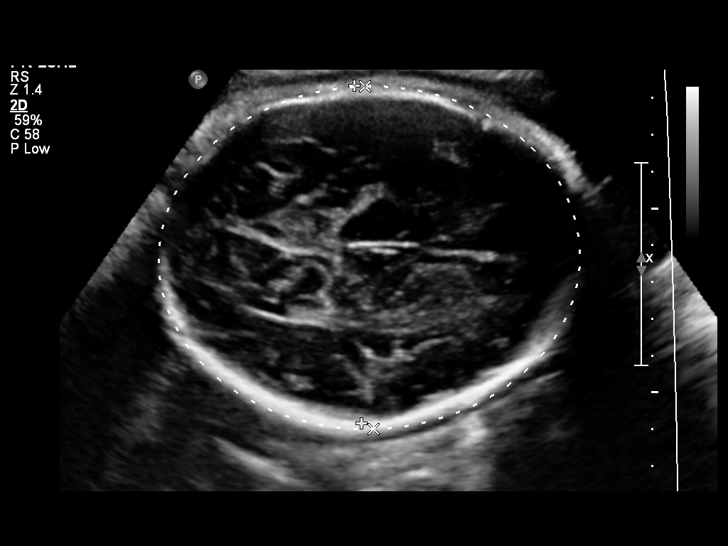
[im 26/42]
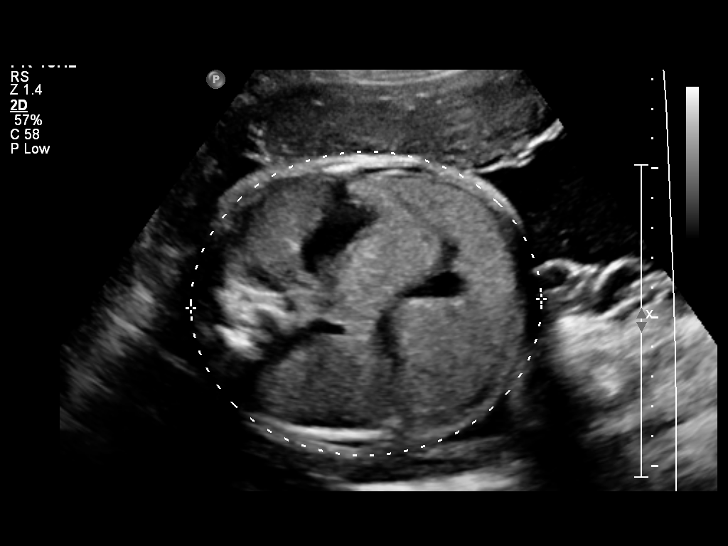
[im 29/42]
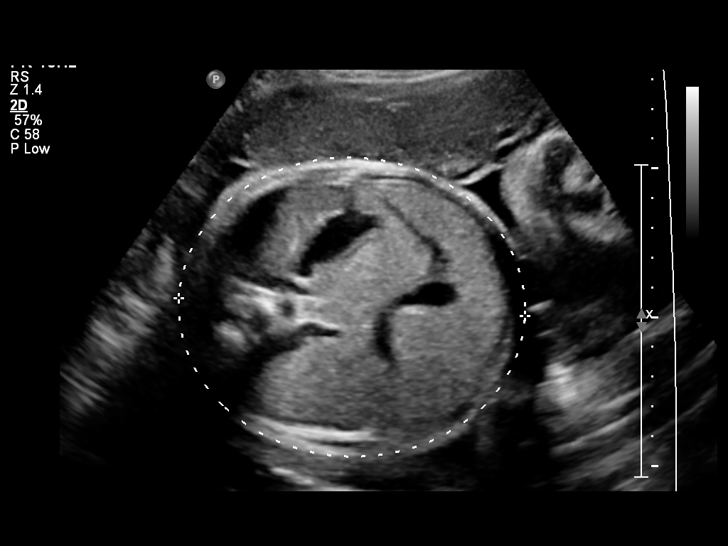
[im 34/42]
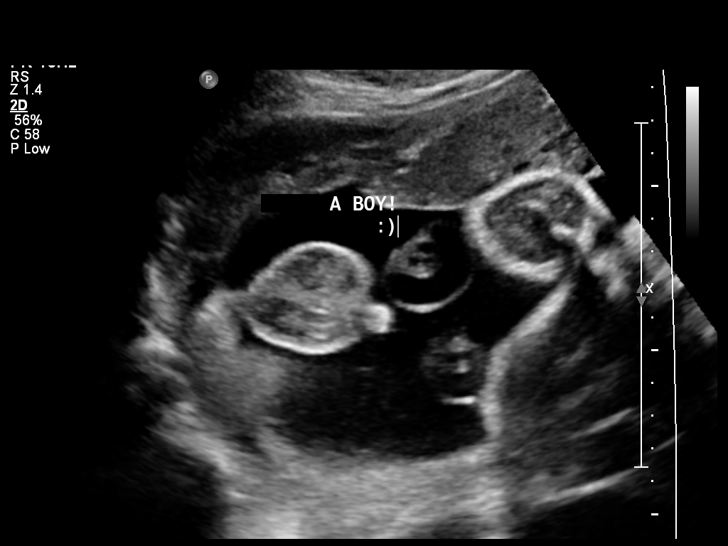
[im 37/42]
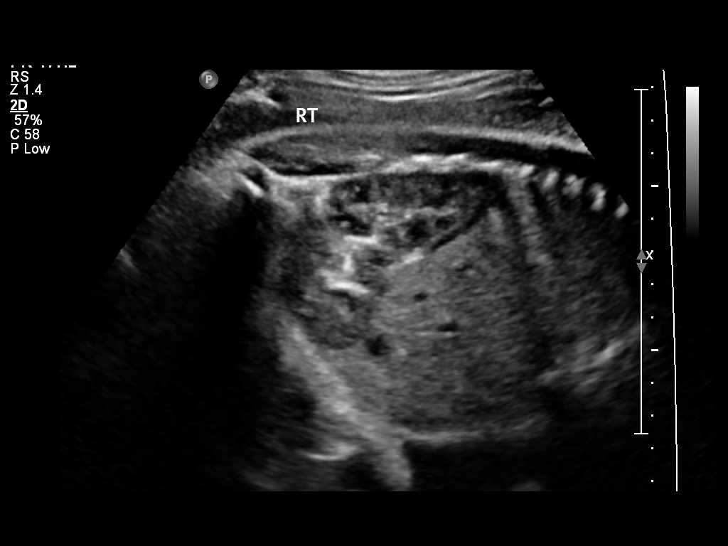
[im 40/42]
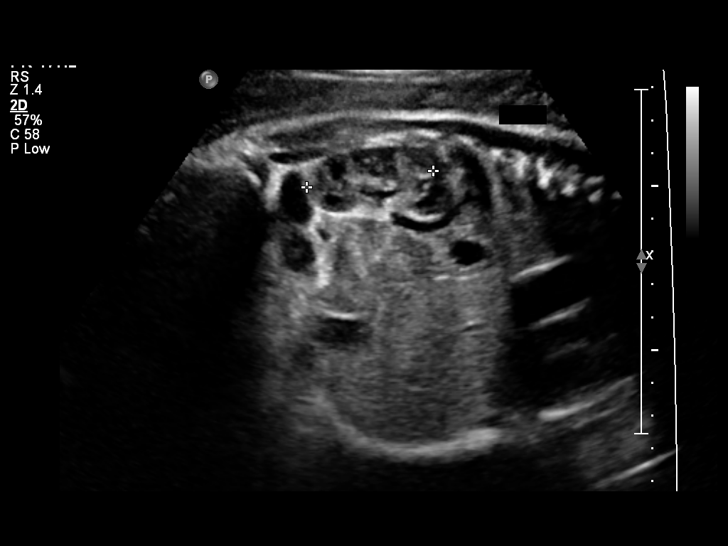

[12 of 28 positions shown; findings below may reference images not displayed]

OBSTETRICS REPORT
                      (Signed Final [DATE] [DATE])

Service(s) Provided

 US OB FOLLOW UP                                       76816.1
Indications

 Size greater than dates (Large for gestational [AGE]
 Thrombocytopenia, idiopathic or autoimmune (ITP)
Fetal Evaluation

 Num Of Fetuses:    1
 Fetal Heart Rate:  146                          bpm
 Cardiac Activity:  Observed
 Presentation:      Cephalic
 Placenta:          Anterior, above cervical os
 P. Cord            Visualized, central
 Insertion:

 Amniotic Fluid
 AFI FV:      Subjectively within normal limits
 AFI Sum:     14.14   cm       52  %Tile     Larg Pckt:    5.17  cm
 RUQ:   5.17    cm   RLQ:    4.19   cm    LUQ:   2.83    cm   LLQ:    1.95   cm
Biometry

 BPD:     91.9  mm     G. Age:  37w 2d                CI:        77.15   70 - 86
                                                      FL/HC:      21.8   20.1 -

 HC:     331.3  mm     G. Age:  37w 5d       54  %    HC/AC:      0.97   0.93 -

 AC:     342.7  mm     G. Age:  38w 1d       94  %    FL/BPD:     78.6   71 - 87
 FL:      72.2  mm     G. Age:  37w 0d       61  %    FL/AC:      21.1   20 - 24
 HUM:     65.1  mm     G. Age:  37w 5d       91  %

 Est. FW:    [IG]  gm      7 lb 4 oz     87  %
Gestational Age

 U/S Today:     37w 4d                                        EDD:   [DATE]
 Best:          36w 3d     Det. By:  Early Ultrasound         EDD:   [DATE]
                                     ([DATE])
Anatomy
 Cranium:          Appears normal         Aortic Arch:      Previously seen
 Fetal Cavum:      Previously seen        Ductal Arch:      Previously seen
 Ventricles:       Appears normal         Diaphragm:        Previously seen
 Choroid Plexus:   Previously seen        Stomach:          Previously Seen
 Cerebellum:       Previously seen        Abdomen:          Previously seen
 Posterior Fossa:  Previously seen        Abdominal Wall:   Previously seen
 Nuchal Fold:      Previously seen        Cord Vessels:     Previously seen
 Face:             Orbits and profile     Kidneys:          Appear normal
                   previously seen
 Lips:             Previously seen        Bladder:          Appears normal
 Heart:            Appears normal         Spine:            Previously seen
                   (4CH, axis, and
                   situs)
 RVOT:             Previously seen        Lower             Previously seen
                                          Extremities:
 LVOT:             Previously seen        Upper             Previously seen
                                          Extremities:

 Other:  Male gender. Right 5th digit previously visualized.
Cervix Uterus Adnexa

 Cervix:       Not visualized (advanced GA >34 wks)
 Uterus:       No abnormality visualized.

 Left Ovary:    No adnexal mass visualized.
 Right Ovary:   No adnexal mass visualized.

 Adnexa:     No abnormality visualized.
Impression

 Single living intrauterine pregnancy at 36 weeks 3 days.
 Appropriate interval fetal growth (87%).
 Normal amniotic fluid volume.
 Normal interval fetal anatomy.
Recommendations

 Follow-up ultrasounds as clinically indicated.

                TERNES

## 2013-10-11 NOTE — Telephone Encounter (Signed)
s.w. pt and advised on dec 3rd appt....pt ok and aware

## 2013-10-11 NOTE — Telephone Encounter (Signed)
Pt called and that she was told that her FMLA papers were completed but she wanted them to be faxed and that she received an email stating that her job had not received them.

## 2013-10-12 LAB — CULTURE, BETA STREP (GROUP B ONLY)

## 2013-10-16 ENCOUNTER — Encounter: Payer: Medicaid Other | Admitting: Family Medicine

## 2013-10-17 ENCOUNTER — Encounter: Payer: Self-pay | Admitting: *Deleted

## 2013-10-23 ENCOUNTER — Ambulatory Visit (INDEPENDENT_AMBULATORY_CARE_PROVIDER_SITE_OTHER): Payer: Medicaid Other | Admitting: Obstetrics and Gynecology

## 2013-10-23 VITALS — BP 110/75 | Temp 98.9°F | Wt 213.1 lb

## 2013-10-23 DIAGNOSIS — D693 Immune thrombocytopenic purpura: Secondary | ICD-10-CM

## 2013-10-23 DIAGNOSIS — O09899 Supervision of other high risk pregnancies, unspecified trimester: Secondary | ICD-10-CM

## 2013-10-23 DIAGNOSIS — Z23 Encounter for immunization: Secondary | ICD-10-CM

## 2013-10-23 LAB — POCT URINALYSIS DIP (DEVICE)
Bilirubin Urine: NEGATIVE
Glucose, UA: NEGATIVE mg/dL
Nitrite: NEGATIVE
Protein, ur: 30 mg/dL — AB

## 2013-10-23 MED ORDER — TETANUS-DIPHTH-ACELL PERTUSSIS 5-2.5-18.5 LF-MCG/0.5 IM SUSP
0.5000 mL | Freq: Once | INTRAMUSCULAR | Status: AC
  Administered 2013-10-23: 0.5 mL via INTRAMUSCULAR

## 2013-10-23 NOTE — Progress Notes (Signed)
P - 104 

## 2013-10-23 NOTE — Progress Notes (Signed)
Started steroids (prednisone 20mg  tid) 10/22/13. [place] 10/14: 87th %ile, AFI 14. Anxious for induction for family concerns. Discussed again with Dr. Jolayne Panther induction not medically indicated.

## 2013-10-23 NOTE — Addendum Note (Signed)
Addended by: Sherre Lain A on: 10/23/2013 09:21 AM   Modules accepted: Orders

## 2013-10-23 NOTE — Patient Instructions (Signed)
Sterilization Information, Female Female sterilization is a procedure to permanently prevent pregnancy. There are different ways to perform sterilization, but all either block or close the fallopian tubes so that your eggs cannot reach your uterus. If your egg cannot reach your uterus, sperm cannot fertilize the egg, and you cannot get pregnant.  Sterilization is performed by a surgical procedure. Sometimes these procedures are performed in a hospital while a patient is asleep. Sometimes they can be done in a clinic setting with the patient awake. The fallopian tubes can be surgically cut, tied, or sealed through a procedure called tubal ligation. The fallopian tubes can also be closed with clips or rings. Sterilization can also be done by placing a tiny coil into each fallopian tube, which causes scar tissue to grow inside the tube. The scar tissue then blocks the tubes.  Discuss sterilization with your caregiver to answer any concerns you or your partner may have. You may want to ask what type of sterilization your caregiver performs. Some caregivers may not perform all the various options. Sterilization is permanent and should only be done if you are sure you do not want children or do not want any more children. Having a sterilization reversed may not be successful.  STERILIZATION PROCEDURES  Laparoscopic sterilization. This is a surgical method performed at a time other than right after childbirth. Two incisions are made in the lower abdomen. A thin, lighted tube (laparoscope) is inserted into one of the incisions and is used to perform the procedure. The fallopian tubes are closed with a ring or a clip. An instrument that uses heat could be used to seal the tubes closed (electrocautery).   Mini-laparotomy. This is a surgical method done 1 or 2 days after giving birth. Typically, a small incision is made just below the belly button (umbilicus) and the fallopian tubes are exposed. The tubes can then be  sealed, tied, or cut.   Hysteroscopic sterilization. This is performed at a time other than right after childbirth. A tiny, spring-like coil is inserted through the cervix and uterus and placed into the fallopian tubes. The coil causes scaring and blocks the tubes. Other forms of contraception should be used for 3 months after the procedure to allow the scar tissue to form completely. Additionally, it is required hysterosalpingography be done 3 months later to ensure that the procedure was successful. Hysterosalpingography is a procedure that uses X-rays to look at your uterus and fallopian tubes after a material to make them show up better has been inserted. IS STERILIZATION SAFE? Sterilization is considered safe with very rare complications. Risks depend on the type of procedure you have. As with any surgical procedure, there are risks. Some risks of sterilization by any means include:   Bleeding.  Infection.  Reaction to anesthesia medicine.  Injury to surrounding organs. Risks specific to having hysteroscopic coils placed include:  The coils may not be placed correctly the first time.   The coils may move out of place.   The tubes may not get completely blocked after 3 months.   Injury to surrounding organs when placing the coil.  HOW EFFECTIVE IS FEMALE STERILIZATION? Sterilization is nearly 100% effective, but it can fail. Depending on the type of sterilization, the rate of failure can be as high as 3%. After hysteroscopic sterilization with placement of fallopian tube coils, you will need back-up birth control for 3 months after the procedure. Sterilization is effective for a lifetime.  BENEFITS OF STERILIZATION  It does   not affect your hormones, and therefore will not affect your menstrual periods, sexual desire, or performance.   It is effective for a lifetime.   It is safe.   You do not need to worry about getting pregnant. Keep in mind that if you had the  hysteroscopic placement procedure, you must wait 3 months after the procedure (or until your caregiver confirms) before pregnancy is not considered possible.   There are no side effects unlike other types of birth control (contraception).  DRAWBACKS OF STERILIZATION  You must be sure you do not want children or any more children. The procedure is permanent.   It does not provide protection against sexually transmitted infections (STIs).   The tubes can grow back together. If this happens, there is a risk of pregnancy. There is also an increased risk (50%) of pregnancy being an ectopic pregnancy. This is a pregnancy that happens outside of the uterus. Document Released: 06/01/2008 Document Revised: 06/14/2012 Document Reviewed: 03/31/2012 [employer] Patient Information 2014 [employer].  

## 2013-10-23 NOTE — Telephone Encounter (Signed)
Pt seen in clinic today. Issue is resolved.

## 2013-10-25 ENCOUNTER — Encounter: Payer: Self-pay | Admitting: *Deleted

## 2013-10-27 ENCOUNTER — Other Ambulatory Visit: Payer: Medicaid Other

## 2013-10-27 DIAGNOSIS — Z862 Personal history of diseases of the blood and blood-forming organs and certain disorders involving the immune mechanism: Secondary | ICD-10-CM

## 2013-10-28 LAB — CBC
Hemoglobin: 10.6 g/dL — ABNORMAL LOW (ref 12.0–15.0)
MCV: 82.2 fL (ref 78.0–100.0)
RBC: 3.82 MIL/uL — ABNORMAL LOW (ref 3.87–5.11)
RDW: 15.6 % — ABNORMAL HIGH (ref 11.5–15.5)
WBC: 7.3 10*3/uL (ref 4.0–10.5)

## 2013-10-30 ENCOUNTER — Encounter (HOSPITAL_COMMUNITY): Payer: Medicaid Other | Admitting: Anesthesiology

## 2013-10-30 ENCOUNTER — Inpatient Hospital Stay (HOSPITAL_COMMUNITY)
Admission: AD | Admit: 2013-10-30 | Discharge: 2013-11-01 | DRG: 767 | Disposition: A | Discharge status: Home / Self Care | Payer: Medicaid Other | Source: Ambulatory Visit | Attending: Obstetrics & Gynecology | Admitting: Obstetrics & Gynecology

## 2013-10-30 ENCOUNTER — Inpatient Hospital Stay (HOSPITAL_COMMUNITY): Payer: Medicaid Other | Admitting: Anesthesiology

## 2013-10-30 ENCOUNTER — Encounter (HOSPITAL_COMMUNITY): Payer: Self-pay | Admitting: *Deleted

## 2013-10-30 ENCOUNTER — Ambulatory Visit (INDEPENDENT_AMBULATORY_CARE_PROVIDER_SITE_OTHER): Payer: Medicaid Other | Admitting: Obstetrics & Gynecology

## 2013-10-30 ENCOUNTER — Encounter: Payer: Self-pay | Admitting: *Deleted

## 2013-10-30 VITALS — BP 117/78 | Temp 98.2°F | Wt 214.3 lb

## 2013-10-30 DIAGNOSIS — O99333 Smoking (tobacco) complicating pregnancy, third trimester: Secondary | ICD-10-CM

## 2013-10-30 DIAGNOSIS — O09899 Supervision of other high risk pregnancies, unspecified trimester: Secondary | ICD-10-CM

## 2013-10-30 DIAGNOSIS — O0942 Supervision of pregnancy with grand multiparity, second trimester: Secondary | ICD-10-CM

## 2013-10-30 DIAGNOSIS — IMO0002 Reserved for concepts with insufficient information to code with codable children: Secondary | ICD-10-CM

## 2013-10-30 DIAGNOSIS — O99213 Obesity complicating pregnancy, third trimester: Secondary | ICD-10-CM

## 2013-10-30 DIAGNOSIS — D693 Immune thrombocytopenic purpura: Secondary | ICD-10-CM | POA: Diagnosis present

## 2013-10-30 DIAGNOSIS — D689 Coagulation defect, unspecified: Principal | ICD-10-CM | POA: Diagnosis present

## 2013-10-30 DIAGNOSIS — O99334 Smoking (tobacco) complicating childbirth: Secondary | ICD-10-CM | POA: Diagnosis present

## 2013-10-30 DIAGNOSIS — Z302 Encounter for sterilization: Secondary | ICD-10-CM

## 2013-10-30 DIAGNOSIS — N979 Female infertility, unspecified: Secondary | ICD-10-CM

## 2013-10-30 LAB — CBC
HCT: 31.8 % — ABNORMAL LOW (ref 36.0–46.0)
MCHC: 33.3 g/dL (ref 30.0–36.0)
MCV: 82.6 fL (ref 78.0–100.0)
Platelets: 87 10*3/uL — ABNORMAL LOW (ref 150–400)
RBC: 3.85 MIL/uL — ABNORMAL LOW (ref 3.87–5.11)
WBC: 11.3 10*3/uL — ABNORMAL HIGH (ref 4.0–10.5)

## 2013-10-30 LAB — POCT URINALYSIS DIP (DEVICE)
Protein, ur: 30 mg/dL — AB
Specific Gravity, Urine: >=1.03 (ref 1.005–1.030)
Urobilinogen, UA: 1 mg/dL (ref 0.0–1.0)
pH: 5.5 (ref 5.0–8.0)

## 2013-10-30 LAB — TYPE AND SCREEN

## 2013-10-30 MED ORDER — ACETAMINOPHEN 325 MG PO TABS: 650.0000 mg | ORAL_TABLET | ORAL | Status: DC | PRN

## 2013-10-30 MED ORDER — PREDNISONE 20 MG PO TABS: 20.0000 mg | ORAL_TABLET | Freq: Every day | ORAL | Status: DC

## 2013-10-30 MED ORDER — TERBUTALINE SULFATE 1 MG/ML IJ SOLN: 0.2500 mg | Freq: Once | INTRAMUSCULAR | Status: AC | PRN

## 2013-10-30 MED ORDER — LIDOCAINE HCL (PF) 1 % IJ SOLN
30.0000 mL | INTRAMUSCULAR | Status: DC | PRN
  Filled 2013-10-30 (×2): qty 30

## 2013-10-30 MED ORDER — OXYCODONE-ACETAMINOPHEN 5-325 MG PO TABS
1.0000 | ORAL_TABLET | ORAL | Status: DC | PRN
  Administered 2013-10-31: 1 via ORAL
  Filled 2013-10-30: qty 1

## 2013-10-30 MED ORDER — PHENYLEPHRINE 40 MCG/ML (10ML) SYRINGE FOR IV PUSH (FOR BLOOD PRESSURE SUPPORT)
80.0000 ug | PREFILLED_SYRINGE | INTRAVENOUS | Status: DC | PRN
  Filled 2013-10-30 (×2): qty 10
  Filled 2013-10-30: qty 2

## 2013-10-30 MED ORDER — FENTANYL 2.5 MCG/ML BUPIVACAINE 1/10 % EPIDURAL INFUSION (WH - ANES)
INTRAMUSCULAR | Status: DC | PRN
  Administered 2013-10-30: 14 mL/h via EPIDURAL

## 2013-10-30 MED ORDER — OXYTOCIN BOLUS FROM INFUSION
500.0000 mL | INTRAVENOUS | Status: DC
  Administered 2013-10-31: 500 mL via INTRAVENOUS

## 2013-10-30 MED ORDER — CITRIC ACID-SODIUM CITRATE 334-500 MG/5ML PO SOLN: 30.0000 mL | ORAL | Status: DC | PRN

## 2013-10-30 MED ORDER — LACTATED RINGERS IV SOLN
500.0000 mL | Freq: Once | INTRAVENOUS | Status: AC
  Administered 2013-10-30: 500 mL via INTRAVENOUS

## 2013-10-30 MED ORDER — FLEET ENEMA 7-19 GM/118ML RE ENEM: 1.0000 | ENEMA | RECTAL | Status: DC | PRN

## 2013-10-30 MED ORDER — PHENYLEPHRINE 40 MCG/ML (10ML) SYRINGE FOR IV PUSH (FOR BLOOD PRESSURE SUPPORT)
80.0000 ug | PREFILLED_SYRINGE | INTRAVENOUS | Status: DC | PRN
  Filled 2013-10-30: qty 2

## 2013-10-30 MED ORDER — OXYTOCIN 40 UNITS IN LACTATED RINGERS INFUSION - SIMPLE MED: 62.5000 mL/h | INTRAVENOUS | Status: DC

## 2013-10-30 MED ORDER — EPHEDRINE 5 MG/ML INJ
10.0000 mg | INTRAVENOUS | Status: DC | PRN
  Filled 2013-10-30: qty 2
  Filled 2013-10-30 (×2): qty 4

## 2013-10-30 MED ORDER — LACTATED RINGERS IV SOLN
500.0000 mL | INTRAVENOUS | Status: DC | PRN
  Administered 2013-10-30: 500 mL via INTRAVENOUS

## 2013-10-30 MED ORDER — EPHEDRINE 5 MG/ML INJ
10.0000 mg | INTRAVENOUS | Status: DC | PRN
  Filled 2013-10-30: qty 2

## 2013-10-30 MED ORDER — PREDNISONE 20 MG PO TABS
40.0000 mg | ORAL_TABLET | Freq: Every day | ORAL | Status: DC
  Filled 2013-10-30: qty 2

## 2013-10-30 MED ORDER — IBUPROFEN 600 MG PO TABS
600.0000 mg | ORAL_TABLET | Freq: Four times a day (QID) | ORAL | Status: DC | PRN
  Administered 2013-10-31: 600 mg via ORAL
  Filled 2013-10-30: qty 1

## 2013-10-30 MED ORDER — FENTANYL 2.5 MCG/ML BUPIVACAINE 1/10 % EPIDURAL INFUSION (WH - ANES)
14.0000 mL/h | INTRAMUSCULAR | Status: DC | PRN
  Filled 2013-10-30: qty 125

## 2013-10-30 MED ORDER — DIPHENHYDRAMINE HCL 50 MG/ML IJ SOLN: 12.5000 mg | INTRAMUSCULAR | Status: DC | PRN

## 2013-10-30 MED ORDER — ONDANSETRON HCL 4 MG/2ML IJ SOLN: 4.0000 mg | Freq: Four times a day (QID) | INTRAMUSCULAR | Status: DC | PRN

## 2013-10-30 MED ORDER — PREDNISONE 20 MG PO TABS
20.0000 mg | ORAL_TABLET | Freq: Two times a day (BID) | ORAL | Status: DC
  Administered 2013-10-30 – 2013-11-01 (×4): 20 mg via ORAL
  Filled 2013-10-30 (×6): qty 1

## 2013-10-30 MED ORDER — LACTATED RINGERS IV SOLN
INTRAVENOUS | Status: DC
  Administered 2013-10-30: 10:00:00 via INTRAVENOUS
  Administered 2013-10-30: 125 mL/h via INTRAVENOUS

## 2013-10-30 MED ORDER — OXYTOCIN 40 UNITS IN LACTATED RINGERS INFUSION - SIMPLE MED
1.0000 m[IU]/min | INTRAVENOUS | Status: DC
  Administered 2013-10-30: 2 m[IU]/min via INTRAVENOUS
  Filled 2013-10-30: qty 1000

## 2013-10-30 MED ORDER — LIDOCAINE HCL (PF) 1 % IJ SOLN
INTRAMUSCULAR | Status: DC | PRN
  Administered 2013-10-30: 4 mL
  Administered 2013-10-30: 3 mL
  Administered 2013-10-30 (×3): 4 mL

## 2013-10-30 NOTE — Progress Notes (Signed)
P= 100 Pt. C/o of pelvic/vaginal pressure.

## 2013-10-30 NOTE — Progress Notes (Signed)
[name] [name] is a 27 y.o. [id number] at [redacted]w[redacted]d  admitted for induction of labor due to thrombocytopenia and planning for BTL.  Subjective: Pt feeling more discomfort, especially in lower back. No other complaints at this time  Objective: BP 115/68  Pulse 87  Temp(Src) 98.4 F (36.9 C) (Oral)  Resp 20  Ht 5\' 7"  (1.702 m)  Wt 97.07 kg (214 lb)  BMI 33.51 kg/m2  LMP 12/11/2012      FHT:  FHR: 130s bpm, variability: moderate,  accelerations:  Present,  decelerations:  Absent UC:   regular, every 2-3 minutes SVE:   Dilation: 4 Effacement (%): 70 Station: -2 Exam by:: Dr. Michail Jewels  Labs: Lab Results  Component Value Date   WBC 11.3* 10/30/2013   HGB 10.6* 10/30/2013   HCT 31.8* 10/30/2013   MCV 82.6 10/30/2013   PLT 87* 10/30/2013    Assessment / Plan: Induction of labor due to ITP,  progressing well on pitocin  Labor: progressing slowly on pit, will continue to increase then AROM, pit at 14 Preeclampsia:  no signs or symptoms of toxicity Fetal Wellbeing:  Category I Pain Control:  IV pain meds, epidural prn I/D:  n/a Anticipated MOD:  NSVD  Anselm Lis 10/30/2013, 7:31 PM

## 2013-10-30 NOTE — Anesthesia Preprocedure Evaluation (Signed)
Anesthesia Evaluation    Airway Mallampati: III TM Distance: >3 FB Neck ROM: Full    Dental no notable dental hx. (+) Teeth Intact   Pulmonary former smoker,  breath sounds clear to auscultation  Pulmonary exam normal       Cardiovascular negative cardio ROS  Rhythm:Regular Rate:Normal     Neuro/Psych  Headaches, PSYCHIATRIC DISORDERS    GI/Hepatic Neg liver ROS, GERD-  Medicated and Controlled,  Endo/Other  negative endocrine ROS  Renal/GU negative Renal ROS  negative genitourinary   Musculoskeletal   Abdominal   Peds  Hematology  (+) Blood dyscrasia, anemia , ITP since age 67. Diagnosed at [facility]. No bleeding diasthesis in past. Has been on steroids since 10/21/2013.   Anesthesia Other Findings   Reproductive/Obstetrics (+) Pregnancy Desires permanent sterilization                           Anesthesia Physical Anesthesia Plan  ASA: II  Anesthesia Plan: Epidural   Post-op Pain Management:    Induction:   Airway Management Planned: Natural Airway  Additional Equipment:   Intra-op Plan:   Post-operative Plan:   Informed Consent: I have reviewed the patients History and Physical, chart, labs and discussed the procedure including the risks, benefits and alternatives for the proposed anesthesia with the patient or authorized representative who has indicated his/her understanding and acceptance.     Plan Discussed with: Anesthesiologist  Anesthesia Plan Comments:         Anesthesia Quick Evaluation

## 2013-10-30 NOTE — Progress Notes (Signed)
Pt on steroids for thrombocytopenia; pt to be induced between 39-40 weeks in hopes that plts are up for epidural (esp for BTL).  Dr. Malen Gauze is willing to place spinal at 66 plt.

## 2013-10-30 NOTE — Anesthesia Procedure Notes (Addendum)
Epidural Patient location during procedure: OB Start time: 10/30/2013 11:31 AM End time: 10/30/2013 11:45 AM  Staffing Anesthesiologist: Cassady Stanczak A. Performed by: anesthesiologist   Preanesthetic Checklist Completed: patient identified, site marked, surgical consent, pre-op evaluation, timeout performed, IV checked, risks and benefits discussed and monitors and equipment checked  Epidural Patient position: sitting Prep: site prepped and draped and DuraPrep Patient monitoring: continuous pulse ox and blood pressure Approach: midline Injection technique: LOR air  Needle:  Needle type: Tuohy  Needle gauge: 17 G Needle length: 9 cm and 9 Needle insertion depth: 6 cm Catheter type: closed end flexible Catheter size: 19 Gauge Catheter at skin depth: 11 cm Test dose: negative and Other  Assessment Events: blood not aspirated, injection not painful, no injection resistance, negative IV test and no paresthesia  Additional Notes Patient identified. Risks and benefits discussed including failed block, incomplete  Pain control, post dural puncture headache, nerve damage, paralysis, blood pressure Changes, nausea, vomiting, reactions to medications-both toxic and allergic and post Partum back pain. All questions were answered. Patient expressed understanding and wished to proceed. Sterile technique was used throughout procedure. Epidural site was Dressed with sterile barrier dressing. No paresthesias, signs of intravascular injection. Epidural infusion not started at this time. Will start infusion when patient becomes  Uncomfortable. Will leave epidural catheter in place and use for PPTL. Or signs of intrathecal spread were encountered.  Patient was more comfortable after the epidural was dosed. Please see RN's note for documentation of vital signs and FHR which are stable.

## 2013-10-30 NOTE — H&P (Signed)
[name] [name] is a 27 y.o. female [id number] at 39w1 presenting for IOL 2/2 thrombocytopenia. Per pt was dx at age 66 years. Idiopathic. No known fmhx but is distant from family so unsure. No trauma this preg. Has had scattered bruising but no more than baseline for her. Denies LOC/headache/dizziness/chest palps or pain. No vag bleeding, LOF, discharge, contraction pain. Still with adequate fetal movement.   PNC at [facility]. Neg quad. Nml anatomic [place] except for echogenic focus in left ventricle. Nml DM. GBS neg. Desires to breast and formula feed. Would like BTL if plt allow. Has signed 30day papers.     Maternal Medical History:  Reason for admission: Nausea.    OB History   Grav Para Term Preterm Abortions TAB SAB Ect Mult Living   11 6 6  0 4 2 1 1  6      1. Term 40w0, svd, m 2. Term 40w0, svd, m 3. Term, 40w0, svd, m  4. Term, 40w0, svd, m 5. SAB 6. Term 40w0, svd, f 7. TAB 8. Term, 39w1, 7lb4oz, svd, f 9. TAB 10. Ectopic 11. Current  Past Medical History  Diagnosis Date  . ITP (idiopathic thrombocytopenic purpura)   . ITP (idiopathic thrombocytopenic purpura)    Past Surgical History  Procedure Laterality Date  . Surgical repair to wrist     Family History: family history includes Cancer in her father; Fibroids in her mother; Thrombocytopenia in her brother. Social History:  reports that she quit smoking about 6 months ago. Her smoking use included Cigarettes. She has a 5 pack-year smoking history. She does not have any smokeless tobacco history on file. She reports that she does not drink alcohol or use illicit drugs.   Prenatal Transfer Tool  Maternal Diabetes: No Genetic Screening: Normal Maternal Ultrasounds/Referrals: Normal , echogenic focus in left ventricle Fetal Ultrasounds or other Referrals:  None Maternal Substance Abuse:  No Significant Maternal Medications:  Meds include: Zantac Other: prednisone taper Significant Maternal Lab Results:  Lab values include:  Group B Strep negative, Other: thrombocytopenia Other Comments:  None  [facility] Genetic Screen  quad screen negative  Anatomic [place]  Nml except echogenic focus in left ventricle  Glucose Screen 68  GBS  neg  Feeding Preference  breast  Contraception  BTS consent signed 09/18/13  Circumcision      Review of Systems  Constitutional: Negative for fever.  HENT: Negative for congestion, nosebleeds and sore throat.   Eyes: Negative for blurred vision, double vision and redness.  Respiratory: Negative for cough, hemoptysis and shortness of breath.   Cardiovascular: Negative for chest pain, palpitations and leg swelling.  Gastrointestinal: Positive for heartburn and diarrhea. Negative for nausea, vomiting, abdominal pain, blood in stool and melena.  Genitourinary: Negative for dysuria, urgency and frequency.  Musculoskeletal: Negative for falls and myalgias.  Neurological: Positive for headaches. Negative for dizziness, sensory change, speech change, seizures, loss of consciousness and weakness.  Endo/Heme/Allergies: Bruises/bleeds easily.  Psychiatric/Behavioral: Negative for depression.    Blood pressure 107/71, pulse 107, temperature 98.1 F (36.7 C), temperature source Oral, resp. rate 20, height 5\' 7"  (1.702 m), weight 97.07 kg (214 lb), last menstrual period 12/11/2012. Exam Physical Exam  Constitutional: She is oriented to person, place, and time. She appears well-developed and well-nourished.  HENT:  Head: Normocephalic and atraumatic.  Eyes: EOM are normal. Pupils are equal, round, and reactive to light.  Neck: Normal range of motion. Neck supple.  Cardiovascular: Normal rate, regular rhythm and normal heart sounds.  No murmur heard. Respiratory: Effort normal and breath sounds normal. No respiratory distress. She has no wheezes.  GI: Soft. Bowel sounds are normal. She exhibits no distension. There is no tenderness.  gravid  Musculoskeletal: Normal range of motion. She exhibits  no edema.  Neurological: She is alert and oriented to person, place, and time. She has normal reflexes.  Skin: Skin is warm and dry. No erythema.  Scattered ecchymosis over upper arms and back   Psychiatric: She has a normal mood and affect. Her behavior is normal.    Dilation: 3 Effacement (%): 60 Station: Ballotable Presentation: Vertex Exam by:: Dr. Michail Jewels  Prenatal labs: ABO, Rh: --/--/A POS (07/19 1642) Antibody: NEG (07/19 1642) Rubella: 3.53 (05/19 0842) RPR: NON REAC (08/25 1209)  HBsAg: NEGATIVE (05/19 1610)  HIV: NON REACTIVE (08/25 1209)  GBS: Negative (10/13 0000)   FHR- 135, mod v, post accel, no decel Toco: none  Assessment/Plan: Ms Coventry is a 27y,o [id number] who presents at 39w1 for IOL 2/2 thrombocytopenia  1. IOL- unchanged from cervical exam in clinic, per pt has hx of progressing quickly, will augment with pit 2x2. Plan for epidural immediately, per documentation from Dr. Penne Lash, Dr Malen Gauze willing to place epidural at plt of 66. And repeat CBC here today with plt of 87.  -routine orders -GBS neg, HIV NR -augment with pit -epidural for pain control  -expect NSVD  2. ITP- since age of 26yrs. No hx of trauma during this preg and no evidence of deep bruising or bleeding, scattered ecchymosis which is nml per pt. Will cont to monitor. Plt in acceptable range at this time per prev documentation.  -plan for epidural asap and will proceed with BTL post partum -will monitor for signs of neuro deficit  3. FHR- reassuring -cat I tracing  4. Postpartum- f/up in [facility] in 4-6 weeks -desires to breast and supplement with bottle feeds -would like BTL for contraception  5. Current smoker- attests to one cigarette every week or so but suspect more, growth AGA at this time -encourage cessation   Anselm Lis 10/30/2013, 10:46 AM  [name] [name] is a 27 y.o. [id number] at [redacted]w[redacted]d here for IOL in order to get epidural and BTL #labor: IOL with pit #Pain: Epidural   #FWB: Cat I #ID:  Gbs Neg #MOF: Breast #MOC: BTL #ITP: continue steroid taper  Tawana Scale, MD OB Fellow

## 2013-10-30 NOTE — Progress Notes (Signed)
[name] [name] is a 27 y.o. [id number] at [redacted]w[redacted]d  admitted for induction of labor due to thrombocytopenia and planning for BTL.  Subjective: Pt starting to feel ctx but minimal. Pt has had dry needle placed 2/2 ITP and will have epidural when needed.  Dry Cath placed for epidural when needed  Objective: BP 117/71  Pulse 77  Temp(Src) 98.2 F (36.8 C) (Oral)  Resp 20  Ht 5\' 7"  (1.702 m)  Wt 97.07 kg (214 lb)  BMI 33.51 kg/m2  LMP 12/11/2012      FHT:  FHR: 130s bpm, variability: moderate,  accelerations:  Present,  decelerations:  Absent UC:   regular, every 2-3 minutes SVE:   Dilation: 3.5 Effacement (%): 70 Station: -2 Exam by:: Dr. Michail Jewels  Labs: Lab Results  Component Value Date   WBC 11.3* 10/30/2013   HGB 10.6* 10/30/2013   HCT 31.8* 10/30/2013   MCV 82.6 10/30/2013   PLT 87* 10/30/2013    Assessment / Plan: Induction of labor due to ITP,  progressing well on pitocin  Labor: Progressing on Pitocin, will continue to increase then AROM Preeclampsia:  no signs or symptoms of toxicity Fetal Wellbeing:  Category I Pain Control:  Epidural and IV pain meds I/D:  n/a Anticipated MOD:  NSVD  [name] [name] 10/30/2013, 3:25 PM

## 2013-10-31 ENCOUNTER — Encounter (HOSPITAL_COMMUNITY): Payer: Medicaid Other | Admitting: Anesthesiology

## 2013-10-31 ENCOUNTER — Encounter (HOSPITAL_COMMUNITY)
Admission: AD | Disposition: A | Discharge status: Home / Self Care | Payer: Self-pay | Source: Ambulatory Visit | Attending: Obstetrics & Gynecology

## 2013-10-31 ENCOUNTER — Encounter (HOSPITAL_COMMUNITY): Payer: Self-pay | Admitting: Family Medicine

## 2013-10-31 ENCOUNTER — Inpatient Hospital Stay (HOSPITAL_COMMUNITY): Payer: Medicaid Other | Admitting: Anesthesiology

## 2013-10-31 DIAGNOSIS — O9912 Other diseases of the blood and blood-forming organs and certain disorders involving the immune mechanism complicating childbirth: Secondary | ICD-10-CM

## 2013-10-31 DIAGNOSIS — D693 Immune thrombocytopenic purpura: Secondary | ICD-10-CM

## 2013-10-31 DIAGNOSIS — D689 Coagulation defect, unspecified: Secondary | ICD-10-CM

## 2013-10-31 DIAGNOSIS — O99334 Smoking (tobacco) complicating childbirth: Secondary | ICD-10-CM

## 2013-10-31 DIAGNOSIS — Z302 Encounter for sterilization: Secondary | ICD-10-CM | POA: Diagnosis not present

## 2013-10-31 DIAGNOSIS — O0940 Supervision of pregnancy with grand multiparity, unspecified trimester: Secondary | ICD-10-CM

## 2013-10-31 HISTORY — PX: TUBAL LIGATION: SHX77

## 2013-10-31 LAB — CBC
HCT: 27.1 % — ABNORMAL LOW (ref 36.0–46.0)
Hemoglobin: 9.1 g/dL — ABNORMAL LOW (ref 12.0–15.0)
Hemoglobin: 9.1 g/dL — ABNORMAL LOW (ref 12.0–15.0)
MCH: 27.4 pg (ref 26.0–34.0)
MCHC: 33 g/dL (ref 30.0–36.0)
MCV: 83.1 fL (ref 78.0–100.0)
Platelets: 62 10*3/uL — ABNORMAL LOW (ref 150–400)
RBC: 3.28 MIL/uL — ABNORMAL LOW (ref 3.87–5.11)
WBC: 10.1 10*3/uL (ref 4.0–10.5)

## 2013-10-31 LAB — SURGICAL PCR SCREEN: MRSA, PCR: NEGATIVE

## 2013-10-31 SURGERY — LIGATION, FALLOPIAN TUBE, POSTPARTUM
Anesthesia: Epidural | Site: Abdomen | Laterality: Bilateral | Wound class: Clean Contaminated

## 2013-10-31 MED ORDER — BENZOCAINE-MENTHOL 20-0.5 % EX AERO: 1.0000 "application " | INHALATION_SPRAY | CUTANEOUS | Status: DC | PRN

## 2013-10-31 MED ORDER — MAGNESIUM HYDROXIDE 400 MG/5ML PO SUSP: 30.0000 mL | ORAL | Status: DC | PRN

## 2013-10-31 MED ORDER — DIBUCAINE 1 % RE OINT: 1.0000 "application " | TOPICAL_OINTMENT | RECTAL | Status: DC | PRN

## 2013-10-31 MED ORDER — MEASLES, MUMPS & RUBELLA VAC ~~LOC~~ INJ
0.5000 mL | INJECTION | Freq: Once | SUBCUTANEOUS | Status: DC
  Filled 2013-10-31: qty 0.5

## 2013-10-31 MED ORDER — ZOLPIDEM TARTRATE 5 MG PO TABS: 5.0000 mg | ORAL_TABLET | Freq: Every evening | ORAL | Status: DC | PRN

## 2013-10-31 MED ORDER — MEPERIDINE HCL 25 MG/ML IJ SOLN: 6.2500 mg | INTRAMUSCULAR | Status: DC | PRN

## 2013-10-31 MED ORDER — SODIUM BICARBONATE 8.4 % IV SOLN
INTRAVENOUS | Status: AC
  Filled 2013-10-31: qty 50

## 2013-10-31 MED ORDER — FENTANYL CITRATE 0.05 MG/ML IJ SOLN
INTRAMUSCULAR | Status: DC | PRN
  Administered 2013-10-31 (×2): 50 ug via INTRAVENOUS

## 2013-10-31 MED ORDER — FAMOTIDINE 20 MG PO TABS
40.0000 mg | ORAL_TABLET | Freq: Once | ORAL | Status: AC
  Administered 2013-10-31: 40 mg via ORAL
  Filled 2013-10-31: qty 2

## 2013-10-31 MED ORDER — MIDAZOLAM HCL 5 MG/5ML IJ SOLN
INTRAMUSCULAR | Status: DC | PRN
  Administered 2013-10-31 (×2): 1 mg via INTRAVENOUS

## 2013-10-31 MED ORDER — BUPIVACAINE HCL 0.5 % IJ SOLN
INTRAMUSCULAR | Status: DC | PRN
  Administered 2013-10-31: 20 mL

## 2013-10-31 MED ORDER — IBUPROFEN 600 MG PO TABS: 600.0000 mg | ORAL_TABLET | Freq: Four times a day (QID) | ORAL | Status: DC

## 2013-10-31 MED ORDER — LACTATED RINGERS IV SOLN
INTRAVENOUS | Status: DC | PRN
  Administered 2013-10-31: 13:00:00 via INTRAVENOUS

## 2013-10-31 MED ORDER — PRENATAL MULTIVITAMIN CH
1.0000 | ORAL_TABLET | Freq: Every day | ORAL | Status: DC
  Administered 2013-11-01: 1 via ORAL
  Filled 2013-10-31: qty 1

## 2013-10-31 MED ORDER — SODIUM BICARBONATE 8.4 % IV SOLN
INTRAVENOUS | Status: DC | PRN
  Administered 2013-10-31 (×2): 5 mL via EPIDURAL
  Administered 2013-10-31: 10 mL via EPIDURAL
  Administered 2013-10-31: 5 mL via EPIDURAL

## 2013-10-31 MED ORDER — SIMETHICONE 80 MG PO CHEW
80.0000 mg | CHEWABLE_TABLET | ORAL | Status: DC | PRN
  Administered 2013-11-01: 80 mg via ORAL
  Filled 2013-10-31: qty 1

## 2013-10-31 MED ORDER — LIDOCAINE-EPINEPHRINE (PF) 2 %-1:200000 IJ SOLN
INTRAMUSCULAR | Status: AC
  Filled 2013-10-31: qty 20

## 2013-10-31 MED ORDER — FENTANYL CITRATE 0.05 MG/ML IJ SOLN
INTRAMUSCULAR | Status: AC
  Filled 2013-10-31: qty 2

## 2013-10-31 MED ORDER — MIDAZOLAM HCL 2 MG/2ML IJ SOLN
INTRAMUSCULAR | Status: AC
  Filled 2013-10-31: qty 2

## 2013-10-31 MED ORDER — DIPHENHYDRAMINE HCL 25 MG PO CAPS: 25.0000 mg | ORAL_CAPSULE | Freq: Four times a day (QID) | ORAL | Status: DC | PRN

## 2013-10-31 MED ORDER — OXYCODONE-ACETAMINOPHEN 5-325 MG PO TABS
1.0000 | ORAL_TABLET | ORAL | Status: DC | PRN
  Administered 2013-10-31 – 2013-11-01 (×5): 2 via ORAL
  Filled 2013-10-31 (×5): qty 2

## 2013-10-31 MED ORDER — PHENYLEPHRINE 40 MCG/ML (10ML) SYRINGE FOR IV PUSH (FOR BLOOD PRESSURE SUPPORT)
PREFILLED_SYRINGE | INTRAVENOUS | Status: AC
  Filled 2013-10-31: qty 5

## 2013-10-31 MED ORDER — WITCH HAZEL-GLYCERIN EX PADS: 1.0000 "application " | MEDICATED_PAD | CUTANEOUS | Status: DC | PRN

## 2013-10-31 MED ORDER — FENTANYL CITRATE 0.05 MG/ML IJ SOLN
25.0000 ug | INTRAMUSCULAR | Status: DC | PRN
  Administered 2013-10-31 (×2): 50 ug via INTRAVENOUS

## 2013-10-31 MED ORDER — LACTATED RINGERS IV SOLN
INTRAVENOUS | Status: DC
  Administered 2013-10-31: 20 mL/h via INTRAVENOUS

## 2013-10-31 MED ORDER — ONDANSETRON HCL 4 MG/2ML IJ SOLN
INTRAMUSCULAR | Status: AC
  Filled 2013-10-31: qty 2

## 2013-10-31 MED ORDER — METOCLOPRAMIDE HCL 5 MG/ML IJ SOLN: 10.0000 mg | Freq: Once | INTRAMUSCULAR | Status: DC | PRN

## 2013-10-31 MED ORDER — LANOLIN HYDROUS EX OINT: 1.0000 "application " | TOPICAL_OINTMENT | CUTANEOUS | Status: DC | PRN

## 2013-10-31 MED ORDER — ONDANSETRON HCL 4 MG PO TABS: 4.0000 mg | ORAL_TABLET | ORAL | Status: DC | PRN

## 2013-10-31 MED ORDER — ONDANSETRON HCL 4 MG/2ML IJ SOLN
INTRAMUSCULAR | Status: DC | PRN
  Administered 2013-10-31: 4 mg via INTRAVENOUS

## 2013-10-31 MED ORDER — ONDANSETRON HCL 4 MG/2ML IJ SOLN: 4.0000 mg | INTRAMUSCULAR | Status: DC | PRN

## 2013-10-31 MED ORDER — FERROUS SULFATE 325 (65 FE) MG PO TABS
325.0000 mg | ORAL_TABLET | Freq: Two times a day (BID) | ORAL | Status: DC
  Administered 2013-11-01: 325 mg via ORAL
  Filled 2013-10-31: qty 1

## 2013-10-31 MED ORDER — METHYLERGONOVINE MALEATE 0.2 MG/ML IJ SOLN: 0.2000 mg | INTRAMUSCULAR | Status: DC | PRN

## 2013-10-31 MED ORDER — FENTANYL CITRATE 0.05 MG/ML IJ SOLN
INTRAMUSCULAR | Status: AC
  Administered 2013-10-31: 50 ug via INTRAVENOUS
  Filled 2013-10-31: qty 2

## 2013-10-31 MED ORDER — METOCLOPRAMIDE HCL 10 MG PO TABS
10.0000 mg | ORAL_TABLET | Freq: Once | ORAL | Status: AC
  Administered 2013-10-31: 10 mg via ORAL
  Filled 2013-10-31: qty 1

## 2013-10-31 MED ORDER — SENNOSIDES-DOCUSATE SODIUM 8.6-50 MG PO TABS
2.0000 | ORAL_TABLET | ORAL | Status: DC
  Administered 2013-11-01: 2 via ORAL
  Filled 2013-10-31: qty 2

## 2013-10-31 MED ORDER — TETANUS-DIPHTH-ACELL PERTUSSIS 5-2.5-18.5 LF-MCG/0.5 IM SUSP: 0.5000 mL | Freq: Once | INTRAMUSCULAR | Status: DC

## 2013-10-31 MED ORDER — METHYLERGONOVINE MALEATE 0.2 MG PO TABS: 0.2000 mg | ORAL_TABLET | ORAL | Status: DC | PRN

## 2013-10-31 SURGICAL SUPPLY — 20 items
BANDAGE ADHESIVE 1X3 (GAUZE/BANDAGES/DRESSINGS) ×1 IMPLANT
CATH ROBINSON RED A/P 16FR (CATHETERS) ×2 IMPLANT
CHLORAPREP W/TINT 26ML (MISCELLANEOUS) ×2 IMPLANT
CLIP FILSHIE TUBAL LIGA STRL (Clip) ×1 IMPLANT
CLOTH BEACON ORANGE TIMEOUT ST (SAFETY) ×2 IMPLANT
GLOVE ECLIPSE 7.0 STRL STRAW (GLOVE) ×2 IMPLANT
GLOVE INDICATOR 7.0 STRL GRN (GLOVE) ×4 IMPLANT
GOWN PREVENTION PLUS LG XLONG (DISPOSABLE) ×3 IMPLANT
GOWN STRL REIN XL XLG (GOWN DISPOSABLE) ×2 IMPLANT
NDL HYPO 25X1 1.5 SAFETY (NEEDLE) IMPLANT
NEEDLE HYPO 25X1 1.5 SAFETY (NEEDLE) ×2 IMPLANT
NS IRRIG 1000ML POUR BTL (IV SOLUTION) ×2 IMPLANT
PACK ABDOMINAL MINOR (CUSTOM PROCEDURE TRAY) ×2 IMPLANT
STRIP CLOSURE SKIN 1/4X3 (GAUZE/BANDAGES/DRESSINGS) ×1 IMPLANT
SUT VIC AB 0 CT1 27 (SUTURE) ×2
SUT VIC AB 0 CT1 27XBRD ANBCTR (SUTURE) ×1 IMPLANT
SUT VIC AB 4-0 PS2 27 (SUTURE) ×2 IMPLANT
SYR CONTROL 10ML LL (SYRINGE) ×1 IMPLANT
TOWEL OR 17X24 6PK STRL BLUE (TOWEL DISPOSABLE) ×4 IMPLANT
WATER STERILE IRR 1000ML POUR (IV SOLUTION) ×1 IMPLANT

## 2013-10-31 NOTE — Transfer of Care (Deleted)
Immediate Anesthesia Transfer of Care Note  Patient: [name] [name]  Procedure(s) Performed: Procedure(s) with comments: POST PARTUM TUBAL LIGATION (Bilateral) - filshie clips used  Patient Location: PACU  Anesthesia Type:Spinal  Level of Consciousness: awake, alert  and oriented  Airway & Oxygen Therapy: Patient Spontanous Breathing  Post-op Assessment: Report given to PACU RN and Post -op Vital signs reviewed and stable  Post vital signs: stable  Complications: No apparent anesthesia complications

## 2013-10-31 NOTE — Anesthesia Postprocedure Evaluation (Signed)
  Anesthesia Post-op Note  Patient: [name] [name]  Procedure(s) Performed: Procedure(s) with comments: POST PARTUM TUBAL LIGATION (Bilateral) - filshie clips used  Patient Location: PACU  Anesthesia Type:Epidural  Level of Consciousness: awake and alert   Airway and Oxygen Therapy: Patient Spontanous Breathing  Post-op Pain: none  Post-op Assessment: Post-op Vital signs reviewed, Patient's Cardiovascular Status Stable, Respiratory Function Stable, Patent Airway, No signs of Nausea or vomiting, Pain level controlled, No headache and No backache  Post-op Vital Signs: Reviewed and stable  Complications: No apparent anesthesia complications

## 2013-10-31 NOTE — Anesthesia Preprocedure Evaluation (Addendum)
Anesthesia Evaluation    Airway Mallampati: III TM Distance: >3 FB Neck ROM: Full    Dental no notable dental hx. (+) Teeth Intact   Pulmonary former smoker,  breath sounds clear to auscultation  Pulmonary exam normal       Cardiovascular negative cardio ROS  Rhythm:Regular Rate:Normal     Neuro/Psych  Headaches, PSYCHIATRIC DISORDERS    GI/Hepatic Neg liver ROS, GERD-  Medicated and Controlled,  Endo/Other  negative endocrine ROS  Renal/GU negative Renal ROS  negative genitourinary   Musculoskeletal   Abdominal   Peds  Hematology  (+) Blood dyscrasia, anemia , ITP since age 60. Diagnosed at [facility]. No bleeding diasthesis in past. Has been on steroids since 10/21/2013.   Anesthesia Other Findings   Reproductive/Obstetrics Desires permanent sterilization                           Anesthesia Physical  Anesthesia Plan  ASA: II  Anesthesia Plan: Epidural   Post-op Pain Management:    Induction:   Airway Management Planned: Natural Airway  Additional Equipment:   Intra-op Plan:   Post-operative Plan:   Informed Consent: I have reviewed the patients History and Physical, chart, labs and discussed the procedure including the risks, benefits and alternatives for the proposed anesthesia with the patient or authorized representative who has indicated his/her understanding and acceptance.     Plan Discussed with: Anesthesiologist, CRNA and Surgeon  Anesthesia Plan Comments: (Patient presents for PPTL with epidural catheter in-situ.)       Anesthesia Quick Evaluation                                   Anesthesia Evaluation    Airway Mallampati: III TM Distance: >3 FB Neck ROM: Full    Dental no notable dental hx. (+) Teeth Intact   Pulmonary former smoker,  breath sounds clear to auscultation  Pulmonary exam normal       Cardiovascular negative cardio  ROS  Rhythm:Regular Rate:Normal     Neuro/Psych  Headaches, PSYCHIATRIC DISORDERS    GI/Hepatic Neg liver ROS, GERD-  Medicated and Controlled,  Endo/Other  negative endocrine ROS  Renal/GU negative Renal ROS  negative genitourinary   Musculoskeletal   Abdominal   Peds  Hematology  (+) Blood dyscrasia, anemia , ITP since age 51. Diagnosed at [facility]. No bleeding diasthesis in past. Has been on steroids since 10/21/2013.   Anesthesia Other Findings   Reproductive/Obstetrics (+) Pregnancy Desires permanent sterilization                           Anesthesia Physical Anesthesia Plan  ASA: II  Anesthesia Plan: Epidural   Post-op Pain Management:    Induction:   Airway Management Planned: Natural Airway  Additional Equipment:   Intra-op Plan:   Post-operative Plan:   Informed Consent: I have reviewed the patients History and Physical, chart, labs and discussed the procedure including the risks, benefits and alternatives for the proposed anesthesia with the patient or authorized representative who has indicated his/her understanding and acceptance.     Plan Discussed with: Anesthesiologist  Anesthesia Plan Comments:         Anesthesia Quick Evaluation

## 2013-10-31 NOTE — Anesthesia Postprocedure Evaluation (Signed)
  Anesthesia Post-op Note  Patient: [name] [name]  Procedure(s) Performed: Lumbar Epidural for L&D  Patient Location: Mother/Baby  Anesthesia Type:Epidural  Level of Consciousness: awake, alert  and oriented  Airway and Oxygen Therapy: Patient Spontanous Breathing  Post-op Pain: none  Post-op Assessment: Post-op Vital signs reviewed, Patient's Cardiovascular Status Stable, Respiratory Function Stable, Patent Airway, No signs of Nausea or vomiting, Pain level controlled, No headache, No backache, No residual numbness and No residual motor weakness  Post-op Vital Signs: Reviewed and stable  Complications: No apparent anesthesia complications

## 2013-10-31 NOTE — Brief Op Note (Signed)
10/30/2013 - 10/31/2013  2:08 PM  PATIENT:  [name] [name]  27 y.o. female  PRE-OPERATIVE DIAGNOSIS:  Desires Sterilization  POST-OPERATIVE DIAGNOSIS:  Desires Sterilization  PROCEDURE:  Procedure(s) with comments: POST PARTUM TUBAL LIGATION (Bilateral) - filshie clips used and bilateral partial salpingectomy  SURGEON:  Surgeon(s) and Role:    * Willodean Rosenthal, MD - Primary  ANESTHESIA:   epidural  EBL:   <10cc  BLOOD ADMINISTERED:none  DRAINS: none   LOCAL MEDICATIONS USED:  MARCAINE     SPECIMEN:  Source of Specimen:  bilateral fallopian tubes  DISPOSITION OF SPECIMEN:  PATHOLOGY  COUNTS:  YES  TOURNIQUET:  * No tourniquets in log *  DICTATION: .Note written in EPIC  PLAN OF CARE: patient is already an in patient   PATIENT DISPOSITION:  PACU - hemodynamically stable.   Delay start of Pharmacological VTE agent (>24hrs) due to surgical blood loss or risk of bleeding: not applicable

## 2013-10-31 NOTE — Op Note (Signed)
[name] [name] 10/30/2013 - 10/31/2013  PREOPERATIVE DIAGNOSIS:  Multiparity, undesired fertility  POSTOPERATIVE DIAGNOSIS:  Multiparity, undesired fertility  PROCEDURE:  Postpartum Bilateral Tubal Sterilization using Filshie Clips and bilateral partial salpingectomy  ANESTHESIA:  Epidural and local analgesia using 0.5% Marcaine  COMPLICATIONS:  None immediate.  ESTIMATED BLOOD LOSS: 5 ml.   INDICATIONS: 27 y.o. [id number]  with undesired fertility,status post vaginal delivery, desires permanent sterilization.  Other reversible forms of contraception were discussed with patient; she declines all other modalities. Risks of procedure discussed with patient including but not limited to: risk of regret, permanence of method, bleeding, infection, injury to surrounding organs and need for additional procedures.  Failure risk of 3-03/999 with increased risk of ectopic gestation if pregnancy occurs was also discussed with patient.     FINDINGS:  Normal uterus, tubes, and ovaries.  PROCEDURE DETAILS: The patient was taken to the operating room where her epidural anesthesia was dosed up to surgical level and found to be adequate.  She was then placed in the dorsal supine position and prepped and draped in sterile fashion.  After an adequate timeout was performed, attention was turned to the patient's abdomen where a small transverse skin incision was made under the umbilical fold. The incision was taken down to the layer of fascia using the scalpel, and fascia was incised, and extended bilaterally using Mayo scissors. The peritoneum was entered in a sharp fashion. Attention was then turned to the patient's uterus, and left fallopian tube was identified and followed out to the fimbriated end.  A Filshie clip was placed on the left fallopian tube about 3 cm from the cornual attachment, with care given to incorporate the underlying mesosalpinx a Kelly clamp was then placed across the fimbriated end of the tube.   The tube was then transected and ligated.  Excellent hemostasis was noted.  A similar process was carried out on the right side allowing for bilateral tubal sterilization.  Good hemostasis was noted overall.  The instruments were then removed from the patient's abdomen and the fascial incision was repaired with 0 Vicryl, and the skin was closed with a 4-0 Vicryl subcuticular stitch. 20cc of 0.5% Marcaine was injected into the umbilicus.The patient tolerated the procedure well.  Instrument, sponge, and needle counts were correct times two.  The patient was then taken to the recovery room awake and in stable condition.

## 2013-10-31 NOTE — Progress Notes (Signed)
UR chart review completed.  

## 2013-10-31 NOTE — Transfer of Care (Signed)
Immediate Anesthesia Transfer of Care Note  Patient: [name] [name]  Procedure(s) Performed: Procedure(s) with comments: POST PARTUM TUBAL LIGATION (Bilateral) - filshie clips used  Patient Location: PACU  Anesthesia Type:Epidural  Level of Consciousness: awake, alert  and oriented  Airway & Oxygen Therapy: Patient Spontanous Breathing  Post-op Assessment: Report given to PACU RN and Post -op Vital signs reviewed and stable.   Germain Osgood RN,  informed of M. Foster MD's order to obtain CBC/call him results prior to removing epidural catheter.  Post vital signs: Reviewed and stable  Complications: No apparent anesthesia complications

## 2013-11-01 ENCOUNTER — Encounter (HOSPITAL_COMMUNITY): Payer: Self-pay | Admitting: Obstetrics & Gynecology

## 2013-11-01 MED ORDER — OXYCODONE-ACETAMINOPHEN 5-325 MG PO TABS: 1.0000 | ORAL_TABLET | ORAL | Status: DC | PRN

## 2013-11-01 MED ORDER — SENNOSIDES-DOCUSATE SODIUM 8.6-50 MG PO TABS: 2.0000 | ORAL_TABLET | ORAL | Status: DC

## 2013-11-01 MED ORDER — OXYCODONE-ACETAMINOPHEN 5-325 MG PO TABS: 1.0000 | ORAL_TABLET | Freq: Four times a day (QID) | ORAL | Status: DC | PRN

## 2013-11-01 MED ORDER — FERROUS SULFATE 325 (65 FE) MG PO TABS: 325.0000 mg | ORAL_TABLET | Freq: Two times a day (BID) | ORAL | Status: DC

## 2013-11-01 NOTE — Discharge Summary (Signed)
Obstetric Discharge Summary Reason for Admission: induction of labor Prenatal Procedures: NST Intrapartum Procedures: spontaneous vaginal delivery Postpartum Procedures: P.P. tubal ligation Complications-Operative and Postpartum: none HGB  Date Value Range Status  10/10/2013 10.3* 11.6 - 15.9 g/dL Final     Hemoglobin  Date Value Range Status  10/31/2013 9.1* 12.0 - 15.0 g/dL Final     HCT  Date Value Range Status  10/31/2013 27.6* 36.0 - 46.0 % Final  10/10/2013 30.2* 34.8 - 46.6 % Final    Physical Exam:  General: alert, cooperative and no distress Lochia: appropriate Uterine Fundus: firm Incision: no significant drainage, no significant erythema DVT Evaluation: No evidence of DVT seen on physical exam.  Discharge Diagnoses: Term Pregnancy-delivered and BTL  Discharge Information: Date: 11/01/2013 Activity: pelvic rest Diet: routine Medications: Ibuprofen and Percocet Condition: stable Instructions: refer to practice specific booklet Discharge to: home Follow-up Information   Follow up with [facility]. Schedule an appointment as soon as possible for a visit in 6 weeks. (for postpartum follow up)       Newborn Data: Live born female  Birth Weight: 8 lb 11 oz (3941 g) APGAR: 8, 9  Home with mother.  Hospital course: Pt was admitted for IOL due to Idiopathic Thrombocytopenia. Her labor was augmented with Pitocin and AROM. She is GBS negative and did not require antibiotic treatment. She progressed well and had a NSVD. She had a postpartum BTL for contraception. Her postpartum course was uncomplicated. She was given a steroid taper for her thrombocytopenia. She is breast and bottle feeding. She desires an outpatient circumcision for her son. She plans to follow up at [facility] for her [facility] care.   [name], [name] 11/01/2013, 9:02 AM  I have seen and examined this patient and agree with above documentation in the resident's note.   Rulon Abide, M.D. [facility]  Fellow 11/01/2013 9:18 AM

## 2013-11-01 NOTE — Anesthesia Postprocedure Evaluation (Signed)
  Anesthesia Post-op Note  Patient: [name] [name]  Procedure(s) Performed: Procedure(s) with comments: POST PARTUM TUBAL LIGATION (Bilateral) - filshie clips used  Patient Location: Mother/Baby  Anesthesia Type:Epidural  Level of Consciousness: awake  Airway and Oxygen Therapy: Patient Spontanous Breathing  Post-op Pain: none  Post-op Assessment: Patient's Cardiovascular Status Stable, Respiratory Function Stable, Patent Airway, No signs of Nausea or vomiting, Adequate PO intake, Pain level controlled, No headache, No backache, No residual numbness and No residual motor weakness  Post-op Vital Signs: Reviewed and stable  Complications: No apparent anesthesia complications

## 2013-11-02 ENCOUNTER — Other Ambulatory Visit: Payer: Self-pay

## 2013-11-14 ENCOUNTER — Encounter: Payer: Self-pay | Admitting: *Deleted

## 2013-11-15 ENCOUNTER — Ambulatory Visit: Payer: Medicaid Other | Admitting: Obstetrics & Gynecology

## 2013-11-27 ENCOUNTER — Ambulatory Visit (INDEPENDENT_AMBULATORY_CARE_PROVIDER_SITE_OTHER): Payer: Medicaid Other | Admitting: Obstetrics and Gynecology

## 2013-11-27 NOTE — Progress Notes (Signed)
  Subjective:     [name] [name] is a 27 y.o. female who presents for a postpartum visit. She is 4 weeks postpartum following a spontaneous vaginal delivery. I have fully reviewed the prenatal and intrapartum course. The delivery was at 39 gestational weeks. Outcome: spontaneous vaginal delivery. Anesthesia: epidural. Postpartum course has been uncomplicated. Baby's course has been uncomplicated. Baby is feeding by breast and formula. Bleeding thin lochia. Bowel function is normal. Bladder function is normal. Patient is sexually active. Contraception method is tubal ligation. Postpartum depression screening: negative.     Review of Systems A comprehensive review of systems was negative.   Objective:    BP 112/77  Pulse 80  Temp(Src) 97.8 F (36.6 C)  Ht 5\' 7"  (1.702 m)  Wt 203 lb (92.08 kg)  BMI 31.79 kg/m2  Breastfeeding? Yes  General:  alert, cooperative and no distress   Breasts:  inspection negative, no nipple discharge or bleeding, no masses or nodularity palpable  Lungs: clear to auscultation bilaterally  Heart:  regular rate and rhythm  Abdomen: soft, non-tender; bowel sounds normal; no masses,  no organomegaly   Vulva:  normal  Vagina: normal vagina  Cervix:  multiparous appearance  Corpus: normal size, contour, position, consistency, mobility, non-tender  Adnexa:  no mass, fullness, tenderness  Rectal Exam: Not performed.        Assessment:     Normal postpartum exam. Pap smear not done at today's visit.   Plan:    1. Contraception: tubal ligation 2. Patient doing well without any complaints. Patient medically cleared to resume all activities of daily living 3. Follow up in: several months (MAY for annual exam) or as needed.

## 2013-11-29 ENCOUNTER — Ambulatory Visit: Payer: Medicaid Other | Admitting: Physician Assistant

## 2013-11-29 ENCOUNTER — Other Ambulatory Visit: Payer: Medicaid Other | Admitting: Lab

## 2013-12-08 ENCOUNTER — Encounter: Payer: Self-pay | Admitting: *Deleted

## 2014-07-20 ENCOUNTER — Other Ambulatory Visit: Payer: Self-pay | Admitting: Medical Oncology

## 2014-07-20 ENCOUNTER — Telehealth: Payer: Self-pay | Admitting: Medical Oncology

## 2014-07-20 ENCOUNTER — Telehealth: Payer: Self-pay | Admitting: Internal Medicine

## 2014-07-20 DIAGNOSIS — D693 Immune thrombocytopenic purpura: Secondary | ICD-10-CM

## 2014-07-20 NOTE — Telephone Encounter (Signed)
no answer or vm....maield pt appt sched adn letter

## 2014-07-20 NOTE — Telephone Encounter (Signed)
Missed last f/u due to not having insurance. She has insurance now and wants to be seen. Note to mohamed.

## 2014-08-03 ENCOUNTER — Ambulatory Visit (HOSPITAL_BASED_OUTPATIENT_CLINIC_OR_DEPARTMENT_OTHER): Payer: Medicaid Other | Admitting: Physician Assistant

## 2014-08-03 ENCOUNTER — Encounter: Payer: Self-pay | Admitting: Physician Assistant

## 2014-08-03 ENCOUNTER — Other Ambulatory Visit (HOSPITAL_BASED_OUTPATIENT_CLINIC_OR_DEPARTMENT_OTHER): Payer: Medicaid Other

## 2014-08-03 VITALS — BP 129/85 | HR 77 | Temp 98.2°F | Resp 20 | Ht 67.0 in | Wt 200.2 lb

## 2014-08-03 DIAGNOSIS — D693 Immune thrombocytopenic purpura: Secondary | ICD-10-CM

## 2014-08-03 DIAGNOSIS — O9989 Other specified diseases and conditions complicating pregnancy, childbirth and the puerperium: Secondary | ICD-10-CM

## 2014-08-03 DIAGNOSIS — O99891 Other specified diseases and conditions complicating pregnancy: Secondary | ICD-10-CM

## 2014-08-03 LAB — COMPREHENSIVE METABOLIC PANEL (CC13)
ALBUMIN: 3.7 g/dL (ref 3.5–5.0)
ALK PHOS: 50 U/L (ref 40–150)
ALT: 21 U/L (ref 0–55)
AST: 23 U/L (ref 5–34)
Anion Gap: 7 mEq/L (ref 3–11)
BUN: 13.6 mg/dL (ref 7.0–26.0)
CO2: 26 mEq/L (ref 22–29)
Calcium: 9 mg/dL (ref 8.4–10.4)
Chloride: 108 mEq/L (ref 98–109)
Creatinine: 0.8 mg/dL (ref 0.6–1.1)
GLUCOSE: 79 mg/dL (ref 70–140)
POTASSIUM: 3.6 meq/L (ref 3.5–5.1)
SODIUM: 140 meq/L (ref 136–145)
TOTAL PROTEIN: 7 g/dL (ref 6.4–8.3)
Total Bilirubin: 0.29 mg/dL (ref 0.20–1.20)

## 2014-08-03 LAB — CBC WITH DIFFERENTIAL/PLATELET
BASO%: 0.5 % (ref 0.0–2.0)
BASOS ABS: 0 10*3/uL (ref 0.0–0.1)
EOS%: 1.5 % (ref 0.0–7.0)
Eosinophils Absolute: 0.1 10*3/uL (ref 0.0–0.5)
HCT: 34.7 % — ABNORMAL LOW (ref 34.8–46.6)
HEMOGLOBIN: 11.7 g/dL (ref 11.6–15.9)
LYMPH#: 2.4 10*3/uL (ref 0.9–3.3)
LYMPH%: 39.5 % (ref 14.0–49.7)
MCH: 27.7 pg (ref 25.1–34.0)
MCHC: 33.7 g/dL (ref 31.5–36.0)
MCV: 82 fL (ref 79.5–101.0)
MONO#: 0.3 10*3/uL (ref 0.1–0.9)
MONO%: 5.3 % (ref 0.0–14.0)
NEUT%: 53.2 % (ref 38.4–76.8)
NEUTROS ABS: 3.2 10*3/uL (ref 1.5–6.5)
Platelets: 98 10*3/uL — ABNORMAL LOW (ref 145–400)
RBC: 4.23 10*6/uL (ref 3.70–5.45)
RDW: 13.9 % (ref 11.2–14.5)
WBC: 6 10*3/uL (ref 3.9–10.3)
nRBC: 0 % (ref 0–0)

## 2014-08-03 NOTE — Progress Notes (Signed)
[place] [phone]   [phone]  OFFICE PROGRESS NOTE   DIAGNOSIS: 28 year old woman with history of ITP   PRIOR THERAPY: None   CURRENT THERAPY: Observation  INTERVAL HISTORY: [name] [name] 28 y.o. female returns to the clinic today for follow up visit. She is status post delivery of a female child on 10/31/2013. She has 7 children Since that time she's had no issues with bleeding but continues to have issues with spontaneous areas of bruising with pain. She reports that these areas start out small but become larger and painful. She denies any specific trauma to these areas that would result in bruising. She reports that her body aches whenever she is in the sun. She complains of occasional low back pain as well as occasional shooting pains in her neck. She works as a Barrister's clerk at [employer]. She  denied having any significant chest pain, shortness breath, cough or hemoptysis. She had a CBC and differential C. met performed earlier today and she is here for evaluation and discussion of her lab results.  MEDICAL HISTORY: Past Medical History  Diagnosis Date  . ITP (idiopathic thrombocytopenic purpura)   . ITP (idiopathic thrombocytopenic purpura)     ALLERGIES:  has No Known Allergies.  MEDICATIONS:  Current Outpatient Prescriptions  Medication Sig Dispense Refill  . acetaminophen (TYLENOL) 500 MG tablet Take 1,000 mg by mouth daily as needed for pain.      . ferrous sulfate 325 (65 FE) MG tablet Take 1 tablet (325 mg total) by mouth 2 (two) times daily with a meal.  60 tablet  0  . oxyCODONE-acetaminophen (PERCOCET/ROXICET) 5-325 MG per tablet Take 1-2 tablets by mouth every 4 (four) hours as needed for severe pain (moderate - severe pain).  15 tablet  0  . predniSONE (DELTASONE) 20 MG tablet 3 tabs (60 mg) daily, starting 10/25 until 11/02, then 2 tabs daily, starting 11/03 until 11/05 then 1 tab daily, starting 11/06 until 11/09 then  d/c.  40 tablet  0  . Prenatal Vit-Fe Fumarate-FA (PRENATAL MULTIVITAMIN) TABS Take 1 tablet by mouth daily at 12 noon.      . ranitidine (ZANTAC) 150 MG tablet Take 150 mg by mouth 2 (two) times daily as needed for heartburn.      . senna-docusate (SENOKOT-S) 8.6-50 MG per tablet Take 2 tablets by mouth daily.  60 tablet  0   Current Facility-Administered Medications  Medication Dose Route Frequency Provider Last Rate Last Dose  . TDaP (BOOSTRIX) injection 0.5 mL  0.5 mL Intramuscular Once Donnamae Jude, MD        SURGICAL HISTORY:  Past Surgical History  Procedure Laterality Date  . Surgical repair to wrist    . Tubal ligation Bilateral 10/31/2013    Procedure: POST PARTUM TUBAL LIGATION;  Surgeon: Lavonia Drafts, MD;  Location: [facility];  Service: Gynecology;  Laterality: Bilateral;  filshie clips used    REVIEW OF SYSTEMS:  Pertinent items are noted in HPI.   PHYSICAL EXAMINATION: General appearance: alert, cooperative, fatigued and no distress Head: Normocephalic, without obvious abnormality, atraumatic Neck: no adenopathy, no JVD, supple, symmetrical, trachea midline and thyroid not enlarged, symmetric, no tenderness/mass/nodules Lymph nodes: Cervical, supraclavicular, and axillary nodes normal. Resp: clear to auscultation bilaterally Cardio: regular rate and rhythm, S1, S2 normal, no murmur, click, rub or gallop GI: soft, non-tender; bowel sounds normal; no masses,  no organomegaly Extremities: extremities normal, atraumatic, no cyanosis or edema  Skin: scattered areas of ecchymosis in various states of resolution somewhat areas of point tenderness without any distinct masses or subcutaneous nodules  ECOG PERFORMANCE STATUS: 1 - Symptomatic but completely ambulatory  Blood pressure 129/85, pulse 77, temperature 98.2 F (36.8 C), temperature source Oral, resp. rate 20, height 5' 7"  (1.702 m), weight 200 lb 3.2 oz (90.81 kg), SpO2 100.00%, currently  breastfeeding.  LABORATORY DATA: Lab Results  Component Value Date   WBC 6.0 08/03/2014   HGB 11.7 08/03/2014   HCT 34.7* 08/03/2014   MCV 82.0 08/03/2014   PLT 98* 08/03/2014      Chemistry      Component Value Date/Time   NA 140 08/03/2014 1015   NA 136 06/05/2013 1030   K 3.6 08/03/2014 1015   K 3.5 06/05/2013 1030   CL 108* 06/12/2013 1448   CL 103 06/05/2013 1030   CO2 26 08/03/2014 1015   CO2 25 06/05/2013 1030   BUN 13.6 08/03/2014 1015   BUN 8 06/05/2013 1030   CREATININE 0.8 08/03/2014 1015   CREATININE 0.62 06/07/2013 1037   CREATININE 0.62 06/05/2013 1030   CREATININE 0.61 04/09/2013 0915      Component Value Date/Time   CALCIUM 9.0 08/03/2014 1015   CALCIUM 8.9 06/05/2013 1030   ALKPHOS 50 08/03/2014 1015   ALKPHOS 52 06/05/2013 1030   AST 23 08/03/2014 1015   AST 15 06/05/2013 1030   ALT 21 08/03/2014 1015   ALT 15 06/05/2013 1030   BILITOT 0.29 08/03/2014 1015   BILITOT 0.2* 06/05/2013 1030       RADIOGRAPHIC STUDIES: No results found.  ASSESSMENT AND PLAN: this is a very pleasant 28 years old [place] American female who is currently [redacted] weeks pregnant with history of ITP. The patient was reviewed with Dr. Alen Blew. Her platelet count of 98,000 is adequate and we will continue her on observation. The amount of ecchymosis that she is experiencing may be related to her history of ITP. She currently does not have a primary care provider. I will refer her to Dr. Liston Alba for primary care as well as further evaluation and management of her other complaints.  The patient was advised to call immediately if she has any concerning symptoms in the interval. The patient voices understanding of current disease status and treatment options and is in agreement with the current care plan.  All questions were answered. The patient knows to call the clinic with any problems, questions or concerns. We can certainly see the patient much sooner if necessary.  [name], [name], PA-C

## 2014-08-06 NOTE — Patient Instructions (Signed)
Followup with Dr. Arbutus PedMohamed in 4 months Keep the appointment with Dr. Ashley RoyaltyMatthews to establish care.

## 2014-10-12 ENCOUNTER — Other Ambulatory Visit: Payer: Self-pay

## 2014-10-13 ENCOUNTER — Encounter (HOSPITAL_COMMUNITY): Payer: Self-pay | Admitting: Emergency Medicine

## 2014-10-13 ENCOUNTER — Emergency Department (HOSPITAL_COMMUNITY)
Admission: EM | Admit: 2014-10-13 | Discharge: 2014-10-13 | Disposition: A | Discharge status: Home / Self Care | Payer: BC Managed Care – PPO | Attending: Emergency Medicine | Admitting: Emergency Medicine

## 2014-10-13 DIAGNOSIS — Z79899 Other long term (current) drug therapy: Secondary | ICD-10-CM | POA: Insufficient documentation

## 2014-10-13 DIAGNOSIS — K088 Other specified disorders of teeth and supporting structures: Secondary | ICD-10-CM | POA: Insufficient documentation

## 2014-10-13 DIAGNOSIS — Z862 Personal history of diseases of the blood and blood-forming organs and certain disorders involving the immune mechanism: Secondary | ICD-10-CM | POA: Insufficient documentation

## 2014-10-13 DIAGNOSIS — Z87891 Personal history of nicotine dependence: Secondary | ICD-10-CM | POA: Diagnosis not present

## 2014-10-13 DIAGNOSIS — K0889 Other specified disorders of teeth and supporting structures: Secondary | ICD-10-CM

## 2014-10-13 DIAGNOSIS — H9209 Otalgia, unspecified ear: Secondary | ICD-10-CM | POA: Diagnosis not present

## 2014-10-13 HISTORY — DX: Dental caries, unspecified: K02.9

## 2014-10-13 HISTORY — DX: Periapical abscess without sinus: K04.7

## 2014-10-13 MED ORDER — HYDROCODONE-ACETAMINOPHEN 5-325 MG PO TABS: 2.0000 | ORAL_TABLET | ORAL | Status: DC | PRN

## 2014-10-13 MED ORDER — PENICILLIN V POTASSIUM 500 MG PO TABS: 500.0000 mg | ORAL_TABLET | Freq: Four times a day (QID) | ORAL | Status: AC

## 2014-10-13 NOTE — ED Provider Notes (Signed)
CSN: [id number]     Arrival date & time 10/13/14  1059 History   First MD Initiated Contact with Patient 10/13/14 1147     Chief Complaint  Patient presents with  . Dental Pain    r/facial pain for a few months     (Consider location/radiation/quality/duration/timing/severity/associated sxs/prior Treatment) Patient is a 28 y.o. female presenting with tooth pain.  Dental Pain Associated symptoms: no fever    [name] [name] is a 28 y.o. female with a history of dental abscess comes in for evaluation of right jaw pain. Patient states she has had this jaw pain intermittently for the past 4 months. She characterizes the jaw pain as an ache soreness that radiates up into her ear. She reports she has dental insurance but has not been to the dentist because she has 7 kids and is unable to find a time. She does admit to poor dentition. She says she only wants antibiotics while she is here. She reports having old prescriptions for cephalexin at home that she has taken and that has helped with the pain. She denies fevers, shortness of breath, difficulty swallowing, eating or drinking.  Past Medical History  Diagnosis Date  . ITP (idiopathic thrombocytopenic purpura)   . ITP (idiopathic thrombocytopenic purpura)   . Dental caries   . Dental abscess   . NVD (normal vaginal delivery)    Past Surgical History  Procedure Laterality Date  . Surgical repair to wrist    . Tubal ligation Bilateral 10/31/2013    Procedure: POST PARTUM TUBAL LIGATION;  Surgeon: Willodean Rosenthalarolyn Harraway-Smith, MD;  Location: [facility];  Service: Gynecology;  Laterality: Bilateral;  filshie clips used   Family History  Problem Relation Age of Onset  . Cancer Father     leukemia  . Fibroids Mother   . Thrombocytopenia Brother     ITP   History  Substance Use Topics  . Smoking status: Former Smoker -- 0.50 packs/day for 10 years    Types: Cigarettes    Quit date: 04/12/2013  . Smokeless tobacco: Not on file  . Alcohol Use:  No   OB History   Grav Para Term Preterm Abortions TAB SAB Ect Mult Living   11 7 7  0 4 2 1 1  7      Review of Systems  Constitutional: Negative for fever.  HENT: Positive for dental problem and ear pain.   Respiratory: Negative for shortness of breath.   Cardiovascular: Negative for chest pain.  Skin: Negative for rash.      Allergies  Review of patient's allergies indicates no known allergies.  Home Medications   Prior to Admission medications   Medication Sig Start Date End Date Taking? Authorizing Provider  acetaminophen (TYLENOL) 500 MG tablet Take 1,000 mg by mouth daily as needed for pain.    Historical Provider, MD  ferrous sulfate 325 (65 FE) MG tablet Take 1 tablet (325 mg total) by mouth 2 (two) times daily with a meal. 11/01/13   Charlane FerrettiMelanie C Marsh, MD  oxyCODONE-acetaminophen (PERCOCET/ROXICET) 5-325 MG per tablet Take 1-2 tablets by mouth every 4 (four) hours as needed for severe pain (moderate - severe pain). 11/01/13   Hal Neeryann Cowart, MD  predniSONE (DELTASONE) 20 MG tablet 3 tabs (60 mg) daily, starting 10/25 until 11/02, then 2 tabs daily, starting 11/03 until 11/05 then 1 tab daily, starting 11/06 until 11/09 then d/c. 10/10/13   Si GaulMohamed Mohamed, MD  Prenatal Vit-Fe Fumarate-FA (PRENATAL MULTIVITAMIN) TABS Take 1 tablet by mouth daily  at 12 noon.    Historical Provider, MD  ranitidine (ZANTAC) 150 MG tablet Take 150 mg by mouth 2 (two) times daily as needed for heartburn.    Historical Provider, MD  senna-docusate (SENOKOT-S) 8.6-50 MG per tablet Take 2 tablets by mouth daily. 11/01/13   Charlane FerrettiMelanie C Marsh, MD   BP 140/84  Pulse 84  Temp(Src) 98.3 F (36.8 C) (Oral)  Resp 20  Wt 196 lb (88.905 kg)  SpO2 100%  LMP 10/07/2014  Breastfeeding? No Physical Exam  Nursing note and vitals reviewed. Constitutional:  Awake, alert, nontoxic appearance.  HENT:  Head: Atraumatic.  Overall poor dentition. Multiple caries and tooth fractures evident. Significant erosion of  multiple teeth. No gingival erythema or swelling. No fluctuance or evidence of drainable abscess. No tonsillar swelling, uvula midline. No evidence of subglossal infection. Both left and right tympanic membranes appear clear with no fluid or erythema.  Eyes: Right eye exhibits no discharge. Left eye exhibits no discharge.  Neck: Neck supple.  Pulmonary/Chest: Effort normal. She exhibits no tenderness.  Abdominal: Soft. There is no tenderness. There is no rebound.  Musculoskeletal: She exhibits no tenderness.  Baseline ROM, no obvious new focal weakness.  Neurological:  Mental status and motor strength appears baseline for patient and situation.  Skin: No rash noted.  Psychiatric: She has a normal mood and affect.    ED Course  Procedures (including critical care time) Labs Review Labs Reviewed - No data to display  Imaging Review No results found.   EKG Interpretation None      MDM  Vitals stable - WNL -afebrile Pt resting comfortably in ED. no evidence of acute or emergent pathology. Tooth pain appears to be chronic in nature. Patient states [facility] of [facility] when she returns from her brother's funeral. PE shows no sign of peritonsillar abscess, Ludwig angina or other emergent pathology.  Will DC with Norco for pain and Pen-Vee K for potential dental infection Discussed f/u with PCP and return precautions, pt very amenable to plan. Patient stable, in good condition and is appropriate for discharge   Final diagnoses:  Tooth pain        Sharlene MottsBenjamin W Sharmel Ballantine, PA-C 10/13/14 1217

## 2014-10-13 NOTE — ED Notes (Signed)
Pt reports pain in r/jaw radiating to r/ear. Pt has progressed over last few months

## 2014-10-13 NOTE — ED Provider Notes (Signed)
Medical screening examination/treatment/procedure(s) were performed by non-physician practitioner and as supervising physician I was immediately available for consultation/collaboration.   EKG Interpretation None        Richardean Canalavid H Courtnay Petrilla, MD 10/13/14 [phone]

## 2014-10-13 NOTE — Discharge Instructions (Signed)
Dental Pain °A tooth ache may be caused by cavities (tooth decay). Cavities expose the nerve of the tooth to air and hot or cold temperatures. It may come from an infection or abscess (also called a boil or furuncle) around your tooth. It is also often caused by dental caries (tooth decay). This causes the pain you are having. °DIAGNOSIS  °Your caregiver can diagnose this problem by exam. °TREATMENT  °· If caused by an infection, it may be treated with medications which kill germs (antibiotics) and pain medications as prescribed by your caregiver. Take medications as directed. °· Only take over-the-counter or prescription medicines for pain, discomfort, or fever as directed by your caregiver. °· Whether the tooth ache today is caused by infection or dental disease, you should see your dentist as soon as possible for further care. °SEEK MEDICAL CARE IF: °The exam and treatment you received today has been provided on an emergency basis only. This is not a substitute for complete medical or dental care. If your problem worsens or new problems (symptoms) appear, and you are unable to meet with your dentist, call or return to this location. °SEEK IMMEDIATE MEDICAL CARE IF:  °· You have a fever. °· You develop redness and swelling of your face, jaw, or neck. °· You are unable to open your mouth. °· You have severe pain uncontrolled by pain medicine. °MAKE SURE YOU:  °· Understand these instructions. °· Will watch your condition. °· Will get help right away if you are not doing well or get worse. °Document Released: 12/14/2005 Document Revised: 03/07/2012 Document Reviewed: 08/01/2008 °ExitCare® Patient Information ©2015 [employer]. This information is not intended to replace advice given to you by your health care provider. Make sure you discuss any questions you have with your health care provider. ° °Dental Care and Dentist Visits °Dental care supports good overall health. Regular dental visits can also help you  avoid dental pain, bleeding, infection, and other more serious health problems in the future. It is important to keep the mouth healthy because diseases in the teeth, gums, and other oral tissues can spread to other areas of the body. Some problems, such as diabetes, heart disease, and pre-term labor have been associated with poor oral health.  °See your dentist every 6 months. If you experience emergency problems such as a toothache or broken tooth, go to the dentist right away. If you see your dentist regularly, you may catch problems early. It is easier to be treated for problems in the early stages.  °WHAT TO EXPECT AT A DENTIST VISIT  °Your dentist will look for many common oral health problems and recommend proper treatment. At your regular dental visit, you can expect: °· Gentle cleaning of the teeth and gums. This includes scraping and polishing. This helps to remove the sticky substance around the teeth and gums (plaque). Plaque forms in the mouth shortly after eating. Over time, plaque hardens on the teeth as tartar. If tartar is not removed regularly, it can cause problems. Cleaning also helps remove stains. °· Periodic X-rays. These pictures of the teeth and supporting bone will help your dentist assess the health of your teeth. °· Periodic fluoride treatments. Fluoride is a natural mineral shown to help strengthen teeth. Fluoride treatment involves applying a fluoride gel or varnish to the teeth. It is most commonly done in children. °· Examination of the mouth, tongue, jaws, teeth, and gums to look for any oral health problems, such as: °¨ Cavities (dental caries). This is   decay on the tooth caused by plaque, sugar, and acid in the mouth. It is best to catch a cavity when it is small. °¨ Inflammation of the gums caused by plaque buildup (gingivitis). °¨ Problems with the mouth or malformed or misaligned teeth. °¨ Oral cancer or other diseases of the soft tissues or jaws.  °KEEP YOUR TEETH AND GUMS  HEALTHY °For healthy teeth and gums, follow these general guidelines as well as your dentist's specific advice: °· Have your teeth professionally cleaned at the dentist every 6 months. °· Brush twice daily with a fluoride toothpaste. °· Floss your teeth daily.  °· Ask your dentist if you need fluoride supplements, treatments, or fluoride toothpaste. °· Eat a healthy diet. Reduce foods and drinks with added sugar. °· Avoid smoking. °TREATMENT FOR ORAL HEALTH PROBLEMS °If you have oral health problems, treatment varies depending on the conditions present in your teeth and gums. °· Your caregiver will most likely recommend good oral hygiene at each visit. °· For cavities, gingivitis, or other oral health disease, your caregiver will perform a procedure to treat the problem. This is typically done at a separate appointment. Sometimes your caregiver will refer you to another dental specialist for specific tooth problems or for surgery. °SEEK IMMEDIATE DENTAL CARE IF: °· You have pain, bleeding, or soreness in the gum, tooth, jaw, or mouth area. °· A permanent tooth becomes loose or separated from the gum socket. °· You experience a blow or injury to the mouth or jaw area. °Document Released: 08/26/2011 Document Revised: 03/07/2012 Document Reviewed: 08/26/2011 °ExitCare® Patient Information ©2015 [employer]. This information is not intended to replace advice given to you by your health care provider. Make sure you discuss any questions you have with your health care provider. ° °

## 2014-10-29 ENCOUNTER — Encounter (HOSPITAL_COMMUNITY): Payer: Self-pay | Admitting: Emergency Medicine

## 2014-12-03 ENCOUNTER — Ambulatory Visit: Payer: Medicaid Other | Admitting: Internal Medicine

## 2014-12-03 ENCOUNTER — Other Ambulatory Visit: Payer: Medicaid Other

## 2015-03-08 ENCOUNTER — Inpatient Hospital Stay (HOSPITAL_COMMUNITY)
Admission: AD | Admit: 2015-03-08 | Discharge: 2015-03-08 | Disposition: A | Discharge status: Home / Self Care | Payer: BLUE CROSS/BLUE SHIELD | Source: Ambulatory Visit | Attending: Family Medicine | Admitting: Family Medicine

## 2015-03-08 ENCOUNTER — Encounter (HOSPITAL_COMMUNITY): Payer: Self-pay | Admitting: *Deleted

## 2015-03-08 DIAGNOSIS — Z87891 Personal history of nicotine dependence: Secondary | ICD-10-CM | POA: Diagnosis not present

## 2015-03-08 DIAGNOSIS — N390 Urinary tract infection, site not specified: Secondary | ICD-10-CM | POA: Diagnosis not present

## 2015-03-08 DIAGNOSIS — D693 Immune thrombocytopenic purpura: Secondary | ICD-10-CM | POA: Diagnosis not present

## 2015-03-08 DIAGNOSIS — R1030 Lower abdominal pain, unspecified: Secondary | ICD-10-CM | POA: Diagnosis present

## 2015-03-08 LAB — CBC
HCT: 35 % — ABNORMAL LOW (ref 36.0–46.0)
Hemoglobin: 11.6 g/dL — ABNORMAL LOW (ref 12.0–15.0)
MCH: 27.3 pg (ref 26.0–34.0)
MCHC: 33.1 g/dL (ref 30.0–36.0)
MCV: 82.4 fL (ref 78.0–100.0)
Platelets: 94 10*3/uL — ABNORMAL LOW (ref 150–400)
RBC: 4.25 MIL/uL (ref 3.87–5.11)
RDW: 14.2 % (ref 11.5–15.5)
WBC: 8.5 10*3/uL (ref 4.0–10.5)

## 2015-03-08 LAB — WET PREP, GENITAL
Clue Cells Wet Prep HPF POC: NONE SEEN
Trich, Wet Prep: NONE SEEN
Yeast Wet Prep HPF POC: NONE SEEN

## 2015-03-08 LAB — URINALYSIS, ROUTINE W REFLEX MICROSCOPIC
Bilirubin Urine: NEGATIVE
Glucose, UA: NEGATIVE mg/dL
KETONES UR: NEGATIVE mg/dL
Nitrite: NEGATIVE
PROTEIN: NEGATIVE mg/dL
SPECIFIC GRAVITY, URINE: 1.015 (ref 1.005–1.030)
UROBILINOGEN UA: 0.2 mg/dL (ref 0.0–1.0)
pH: 6 (ref 5.0–8.0)

## 2015-03-08 LAB — URINE MICROSCOPIC-ADD ON

## 2015-03-08 LAB — POCT PREGNANCY, URINE: Preg Test, Ur: NEGATIVE

## 2015-03-08 MED ORDER — KETOROLAC TROMETHAMINE 60 MG/2ML IM SOLN
60.0000 mg | Freq: Once | INTRAMUSCULAR | Status: AC
  Administered 2015-03-08: 60 mg via INTRAMUSCULAR
  Filled 2015-03-08: qty 2

## 2015-03-08 MED ORDER — NITROFURANTOIN MONOHYD MACRO 100 MG PO CAPS: 100.0000 mg | ORAL_CAPSULE | Freq: Two times a day (BID) | ORAL | Status: DC

## 2015-03-08 NOTE — MAU Note (Signed)
Pt presents complaining of left flank pain and pain and pressure with urination. States she is also having ongoing lower abdominal pain. States she was seen by her PCP yesterday for the pain in her arms and legs and was prescribed gabapentin. Denies vaginal bleeding or discharge.

## 2015-03-08 NOTE — MAU Provider Note (Signed)
History     CSN: [id number]  Arrival date and time: 03/08/15 1745   First Provider Initiated Contact with Patient 03/08/15 1819      Chief Complaint  Patient presents with  . Flank Pain  . Abdominal Pain   HPI  29 y.o. [id number] presents to the MAU with the complain of  left flank pain and pain and pressure with urination. States she is also having ongoing lower abdominal pain. States she was seen by her PCP yesterday for the pain in her arms and legs and was prescribed gabapentin. Denies vaginal bleeding or discharge.            Past Medical History  Diagnosis Date  . ITP (idiopathic thrombocytopenic purpura)   . ITP (idiopathic thrombocytopenic purpura)   . Dental caries   . Dental abscess   . NVD (normal vaginal delivery)     Past Surgical History  Procedure Laterality Date  . Surgical repair to wrist    . Tubal ligation Bilateral 10/31/2013    Procedure: POST PARTUM TUBAL LIGATION;  Surgeon: Willodean Rosenthalarolyn Harraway-Smith, MD;  Location: [facility];  Service: Gynecology;  Laterality: Bilateral;  filshie clips used    Family History  Problem Relation Age of Onset  . Cancer Father     leukemia  . Fibroids Mother   . Thrombocytopenia Brother     ITP    History  Substance Use Topics  . Smoking status: Former Smoker -- 0.50 packs/day for 10 years    Types: Cigarettes    Quit date: 04/12/2013  . Smokeless tobacco: Not on file  . Alcohol Use: No    Allergies: No Known Allergies  Facility-administered medications prior to admission  Medication Dose Route Frequency Provider Last Rate Last Dose  . TDaP (BOOSTRIX) injection 0.5 mL  0.5 mL Intramuscular Once Reva Boresanya S Pratt, MD       Prescriptions prior to admission  Medication Sig Dispense Refill Last Dose  . acetaminophen (TYLENOL) 500 MG tablet Take 1,000 mg by mouth daily as needed for pain.   Taking  . ferrous sulfate 325 (65 FE) MG tablet Take 1 tablet (325 mg total) by mouth 2 (two) times daily with a meal. 60  tablet 0 Not Taking  . HYDROcodone-acetaminophen (NORCO/VICODIN) 5-325 MG per tablet Take 2 tablets by mouth every 4 (four) hours as needed for moderate pain or severe pain. 10 tablet 0   . oxyCODONE-acetaminophen (PERCOCET/ROXICET) 5-325 MG per tablet Take 1-2 tablets by mouth every 4 (four) hours as needed for severe pain (moderate - severe pain). 15 tablet 0 Not Taking  . predniSONE (DELTASONE) 20 MG tablet 3 tabs (60 mg) daily, starting 10/25 until 11/02, then 2 tabs daily, starting 11/03 until 11/05 then 1 tab daily, starting 11/06 until 11/09 then d/c. 40 tablet 0 Not Taking  . Prenatal Vit-Fe Fumarate-FA (PRENATAL MULTIVITAMIN) TABS Take 1 tablet by mouth daily at 12 noon.   Not Taking  . ranitidine (ZANTAC) 150 MG tablet Take 150 mg by mouth 2 (two) times daily as needed for heartburn.   Not Taking  . senna-docusate (SENOKOT-S) 8.6-50 MG per tablet Take 2 tablets by mouth daily. 60 tablet 0 Not Taking    Review of Systems  Constitutional: Negative.  Negative for fever, chills, weight loss, malaise/fatigue and diaphoresis.  HENT: Negative.   Eyes: Negative.   Respiratory: Negative.  Negative for shortness of breath.   Cardiovascular: Negative.   Gastrointestinal: Negative.   Genitourinary: Positive for dysuria and flank pain.  Lower pelvic pain  Musculoskeletal: Positive for back pain.  Skin: Negative.  Negative for itching and rash.  Neurological: Negative.  Negative for weakness and headaches.  Endo/Heme/Allergies: Negative.   Psychiatric/Behavioral: Negative.    Physical Exam   Blood pressure 130/77, pulse 101, temperature 98.3 F (36.8 C), resp. rate 20, height  (1.702 m), weight 90.81 kg (200 lb 3.2 oz), last menstrual period 02/22/2015, SpO2 99 %, not currently breastfeeding.  Physical Exam  Constitutional: She is oriented to person, place, and time. She appears well-developed and well-nourished. No distress.  HENT:  Head: Normocephalic and atraumatic.   Cardiovascular: Normal rate.   Respiratory: Effort normal. No respiratory distress.  GI: Soft. She exhibits no distension and no mass. There is no tenderness. There is no rebound and no guarding.  Musculoskeletal: Normal range of motion.  Neurological: She is alert and oriented to person, place, and time.  Skin: Skin is warm and dry.  Psychiatric: She has a normal mood and affect. Her behavior is normal. Judgment and thought content normal.   Results for orders placed or performed during the hospital encounter of 03/08/15 (from the past 24 hour(s))  Urinalysis, Routine w reflex microscopic     Status: Abnormal   Collection Time: 03/08/15  5:50 PM  Result Value Ref Range   Color, Urine YELLOW YELLOW   APPearance HAZY (A) CLEAR   Specific Gravity, Urine 1.015 1.005 - 1.030   pH 6.0 5.0 - 8.0   Glucose, UA NEGATIVE NEGATIVE mg/dL   Hgb urine dipstick TRACE (A) NEGATIVE   Bilirubin Urine NEGATIVE NEGATIVE   Ketones, ur NEGATIVE NEGATIVE mg/dL   Protein, ur NEGATIVE NEGATIVE mg/dL   Urobilinogen, UA 0.2 0.0 - 1.0 mg/dL   Nitrite NEGATIVE NEGATIVE   Leukocytes, UA LARGE (A) NEGATIVE  Urine microscopic-add on     Status: Abnormal   Collection Time: 03/08/15  5:50 PM  Result Value Ref Range   Squamous Epithelial / LPF FEW (A) RARE   WBC, UA 21-50 <3 WBC/hpf  CBC     Status: Abnormal   Collection Time: 03/08/15  6:38 PM  Result Value Ref Range   WBC 8.5 4.0 - 10.5 K/uL   RBC 4.25 3.87 - 5.11 MIL/uL   Hemoglobin 11.6 (L) 12.0 - 15.0 g/dL   HCT 16.1 (L) 09.6 - 04.5 %   MCV 82.4 78.0 - 100.0 fL   MCH 27.3 26.0 - 34.0 pg   MCHC 33.1 30.0 - 36.0 g/dL   RDW 40.9 81.1 - 91.4 %   Platelets 94 (L) 150 - 400 K/uL  Pregnancy, urine POC     Status: None   Collection Time: 03/08/15  6:42 PM  Result Value Ref Range   Preg Test, Ur NEGATIVE NEGATIVE    MAU Course  Procedures UA CBC WET Prep GC [place] cultures Toradol  IM  Assessment and Plan  Urinary Tract  Infection ITP  Macrobid  PO BID Discharge to home  [name] [name],[name] [name] 03/08/2015, 6:20 PM

## 2015-03-08 NOTE — Discharge Instructions (Signed)

## 2015-03-09 LAB — HIV ANTIBODY (ROUTINE TESTING W REFLEX): HIV Screen 4th Generation wRfx: NONREACTIVE

## 2015-03-11 LAB — URINE CULTURE
Colony Count: >=100000
Special Requests: NORMAL

## 2015-03-11 LAB — GC/CHLAMYDIA PROBE AMP (~~LOC~~) NOT AT ARMC
Chlamydia: NEGATIVE
Neisseria Gonorrhea: NEGATIVE

## 2015-11-06 ENCOUNTER — Emergency Department (HOSPITAL_COMMUNITY)
Admission: EM | Admit: 2015-11-06 | Discharge: 2015-11-06 | Disposition: A | Discharge status: Home / Self Care | Payer: BLUE CROSS/BLUE SHIELD | Attending: Emergency Medicine | Admitting: Emergency Medicine

## 2015-11-06 ENCOUNTER — Emergency Department (HOSPITAL_COMMUNITY): Payer: BLUE CROSS/BLUE SHIELD

## 2015-11-06 ENCOUNTER — Encounter (HOSPITAL_COMMUNITY): Payer: Self-pay | Admitting: Emergency Medicine

## 2015-11-06 DIAGNOSIS — Z8719 Personal history of other diseases of the digestive system: Secondary | ICD-10-CM | POA: Insufficient documentation

## 2015-11-06 DIAGNOSIS — Z79899 Other long term (current) drug therapy: Secondary | ICD-10-CM | POA: Insufficient documentation

## 2015-11-06 DIAGNOSIS — R059 Cough, unspecified: Secondary | ICD-10-CM

## 2015-11-06 DIAGNOSIS — R05 Cough: Secondary | ICD-10-CM | POA: Diagnosis present

## 2015-11-06 DIAGNOSIS — R000 Tachycardia, unspecified: Secondary | ICD-10-CM | POA: Insufficient documentation

## 2015-11-06 DIAGNOSIS — J069 Acute upper respiratory infection, unspecified: Secondary | ICD-10-CM | POA: Insufficient documentation

## 2015-11-06 DIAGNOSIS — Z87891 Personal history of nicotine dependence: Secondary | ICD-10-CM | POA: Insufficient documentation

## 2015-11-06 DIAGNOSIS — Z862 Personal history of diseases of the blood and blood-forming organs and certain disorders involving the immune mechanism: Secondary | ICD-10-CM | POA: Diagnosis not present

## 2015-11-06 LAB — CBC
HEMATOCRIT: 38.5 % (ref 36.0–46.0)
Hemoglobin: 13.1 g/dL (ref 12.0–15.0)
MCH: 27.9 pg (ref 26.0–34.0)
MCHC: 34 g/dL (ref 30.0–36.0)
MCV: 82.1 fL (ref 78.0–100.0)
Platelets: 112 10*3/uL — ABNORMAL LOW (ref 150–400)
RBC: 4.69 MIL/uL (ref 3.87–5.11)
RDW: 13.7 % (ref 11.5–15.5)
WBC: 7.1 10*3/uL (ref 4.0–10.5)

## 2015-11-06 LAB — BASIC METABOLIC PANEL
ANION GAP: 9 (ref 5–15)
BUN: 11 mg/dL (ref 6–20)
CHLORIDE: 107 mmol/L (ref 101–111)
CO2: 25 mmol/L (ref 22–32)
Calcium: 9.5 mg/dL (ref 8.9–10.3)
Creatinine, Ser: 0.72 mg/dL (ref 0.44–1.00)
GFR calc Af Amer: >60 mL/min (ref >60)
GFR calc non Af Amer: >60 mL/min (ref >60)
GLUCOSE: 84 mg/dL (ref 65–99)
POTASSIUM: 3.4 mmol/L — AB (ref 3.5–5.1)
Sodium: 141 mmol/L (ref 135–145)

## 2015-11-06 LAB — TSH: TSH: 3.236 u[IU]/mL (ref 0.350–4.500)

## 2015-11-06 LAB — I-STAT TROPONIN, ED: Troponin i, poc: 0 ng/mL (ref 0.00–0.08)

## 2015-11-06 IMAGING — CR DG CHEST 2V
2 series · 2 of 2 positions shown · non-contrast
Comparison: [DATE]

CLINICAL DATA: One week history of shortness of breath and cough

EXAM:
CHEST  2 VIEW

[w chest pa]
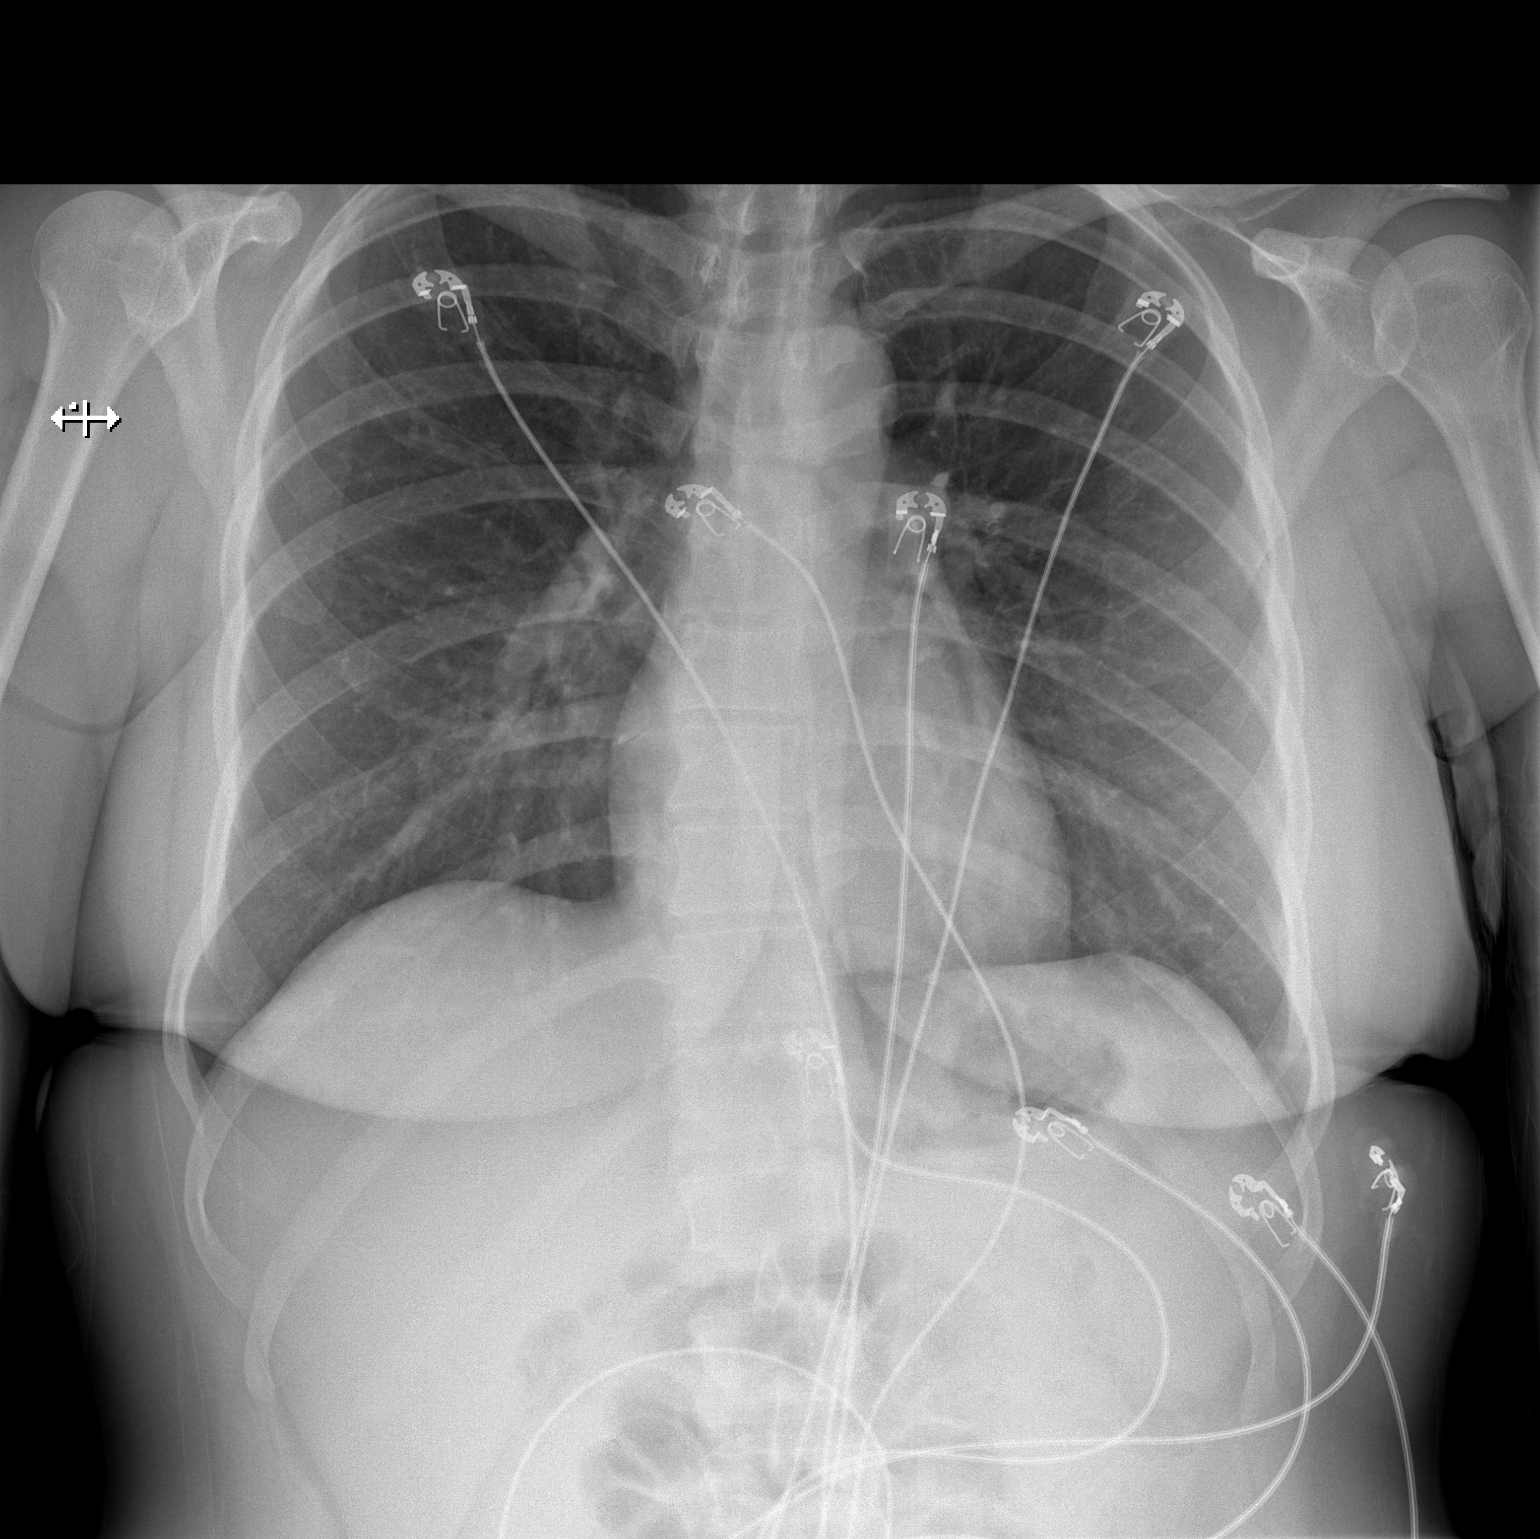

[w chest lat]
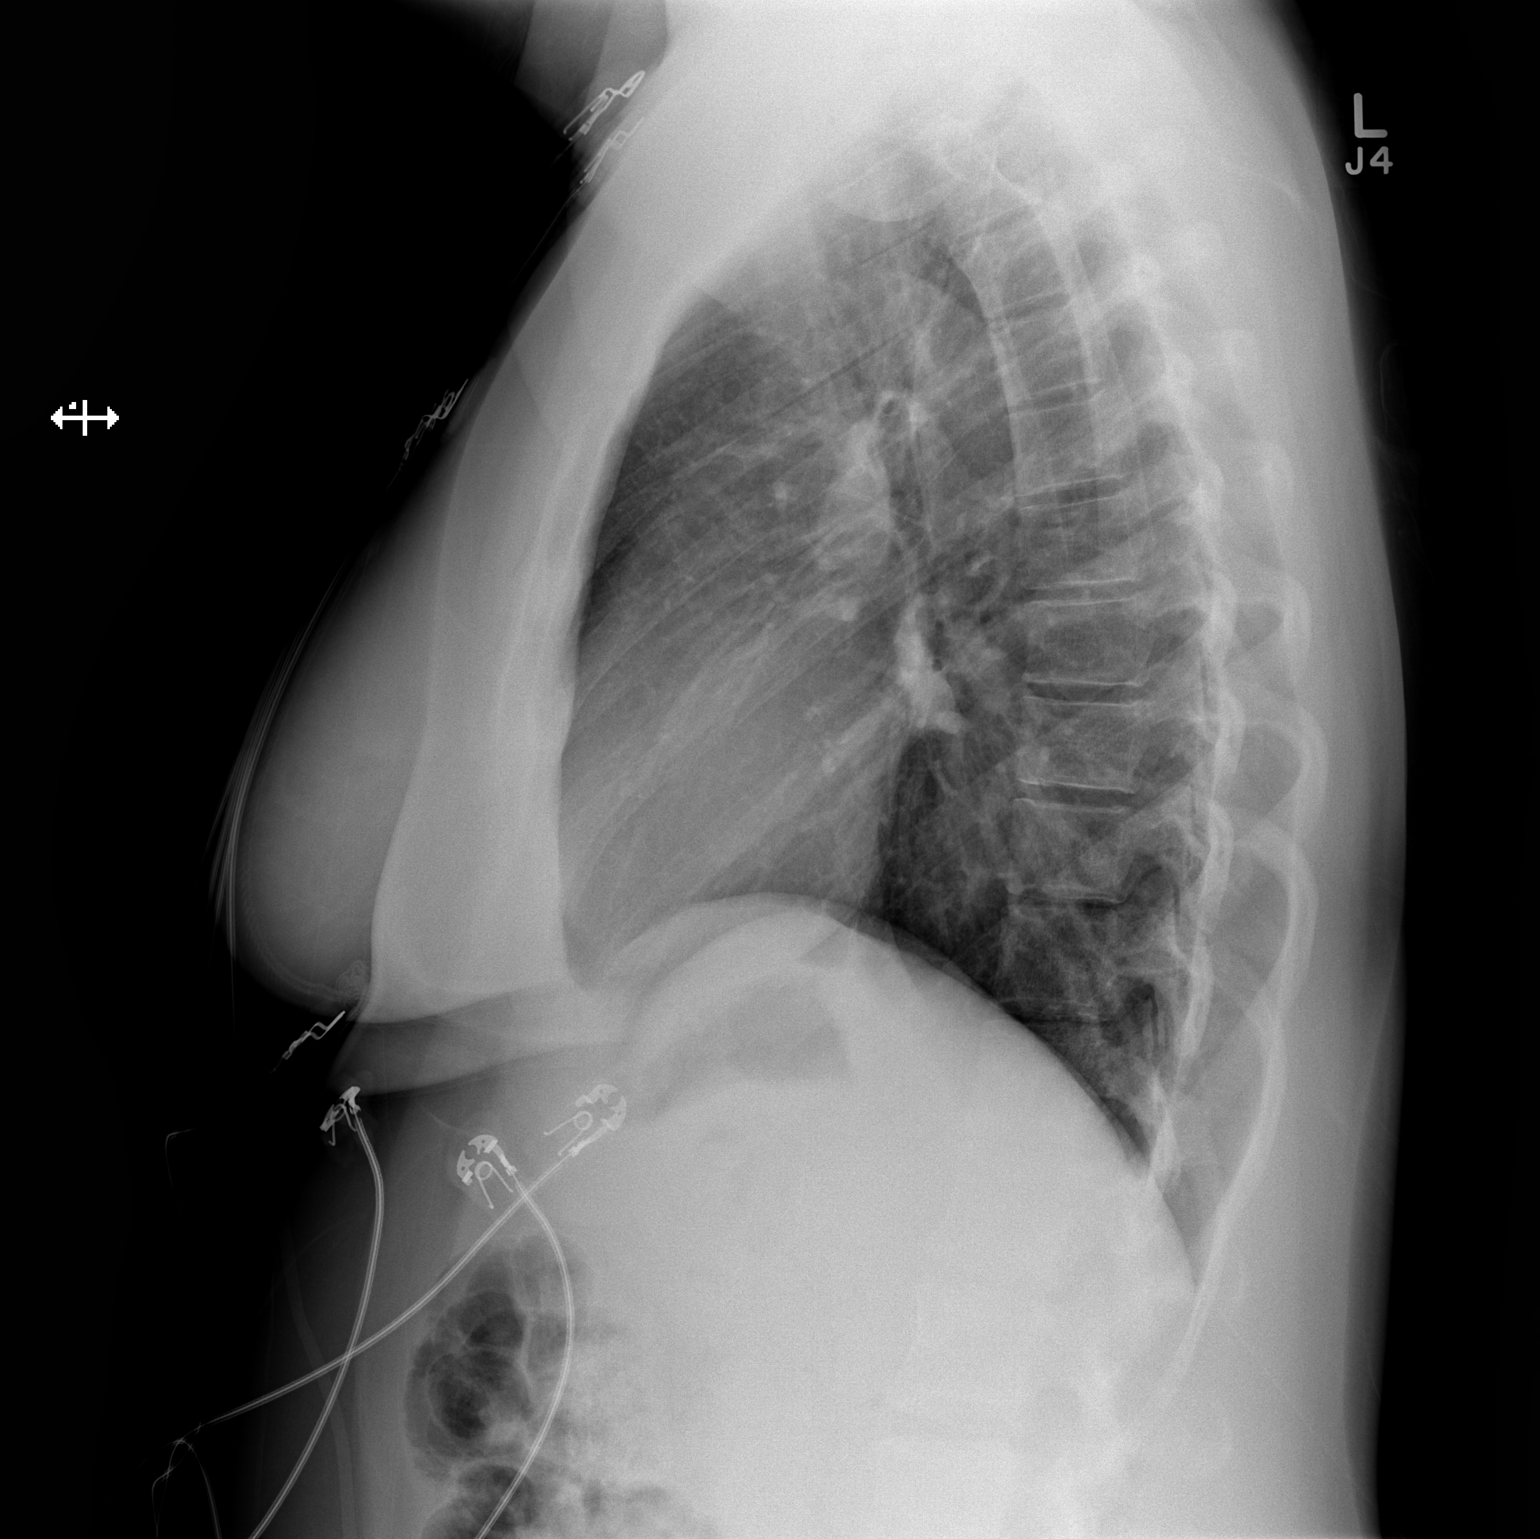

[2 of 2 positions shown; findings below may reference images not displayed]

FINDINGS: Lungs are clear. Heart size and pulmonary vascularity are normal. No
adenopathy. There is slight lower thoracic dextroscoliosis.
IMPRESSION: No edema or consolidation.

## 2015-11-06 IMAGING — CT CT ANGIO CHEST
2 of 6 series · 18 of 36 positions shown · IV contrast (OMNIPAQUE 350)
Comparison: None.

CLINICAL DATA: Persistent dry cough since [DATE]. Occasional
shortness of breath and right-sided chest pain.

EXAM:
CT ANGIOGRAPHY CHEST WITH CONTRAST
TECHNIQUE: Multidetector CT imaging of the chest was performed using the
standard protocol during bolus administration of intravenous
contrast. Multiplanar CT image reconstructions and MIPs were
obtained to evaluate the vascular anatomy.
CONTRAST:  100mL OMNIPAQUE IOHEXOL 350 MG/ML SOLN

[Series 6: thins for pacs · axial · 0.67mm/px · z∈[+1499,+1711]mm · 17 of 236 slices shown]
[im 12/236  lung]
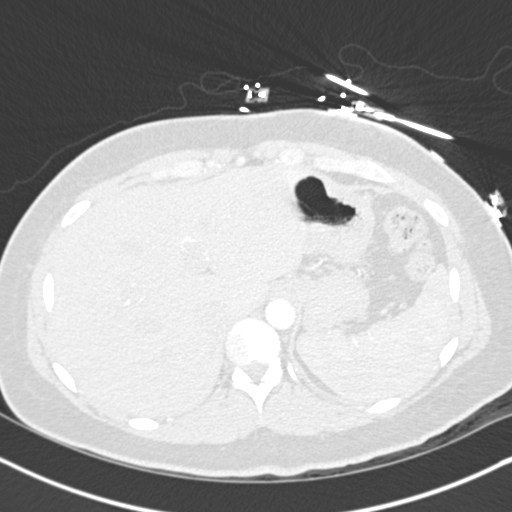
[im 24/236  mediastinal]
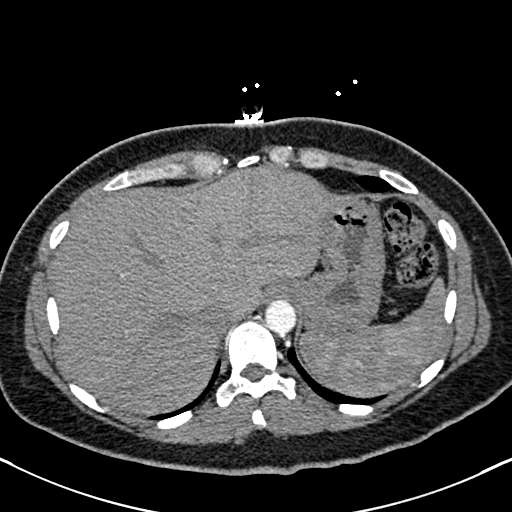
[im 36/236  lung]
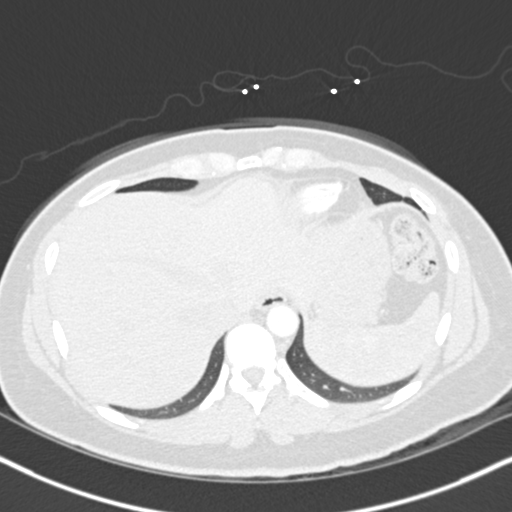
[im 48/236  mediastinal]
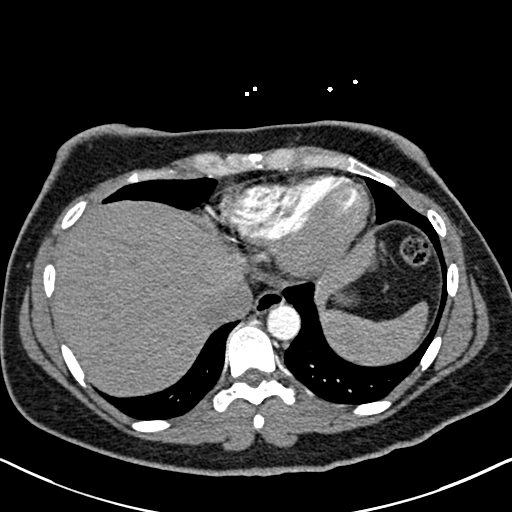
[im 71/236  lung]
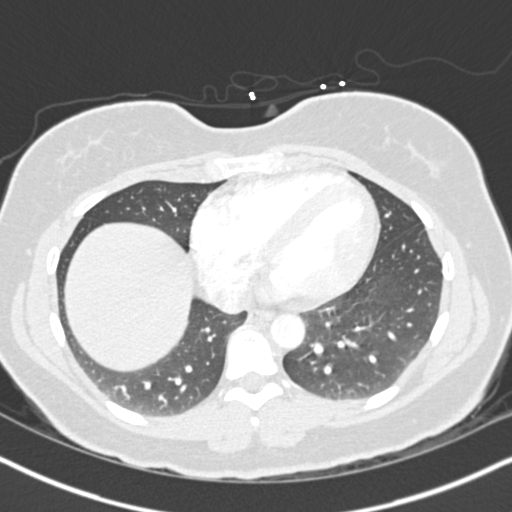
[im 83/236  mediastinal]
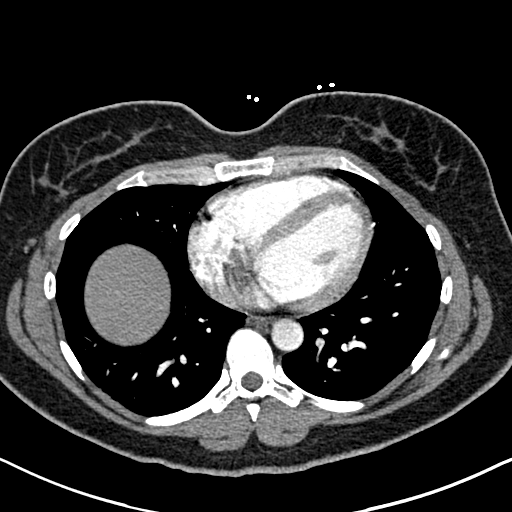
[im 95/236  lung]
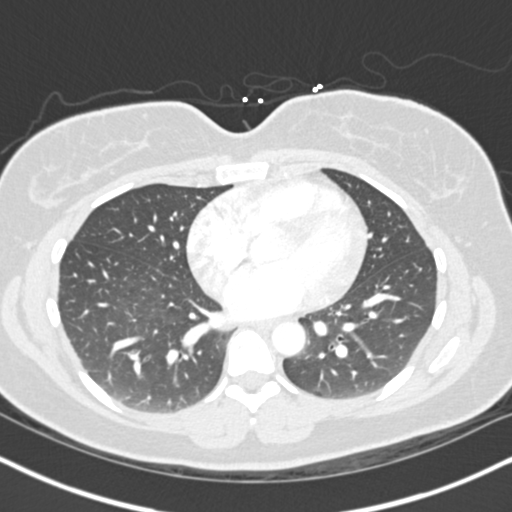
[im 106/236  mediastinal]
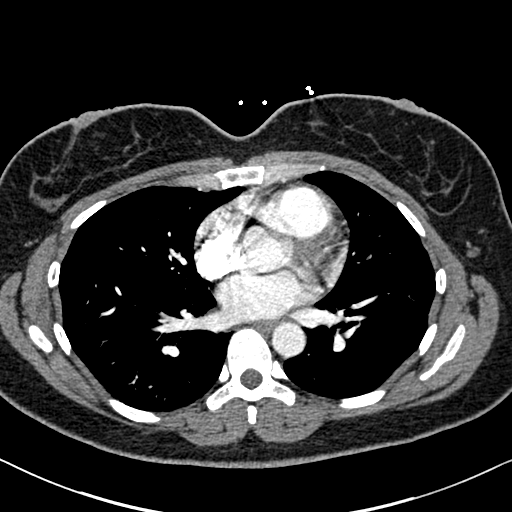
[im 118/236  lung]
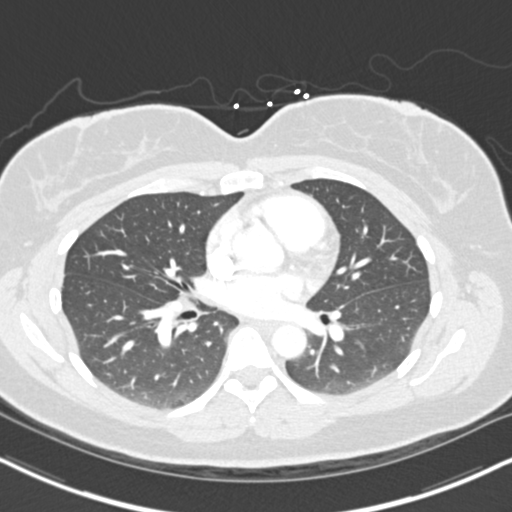
[im 130/236  mediastinal]
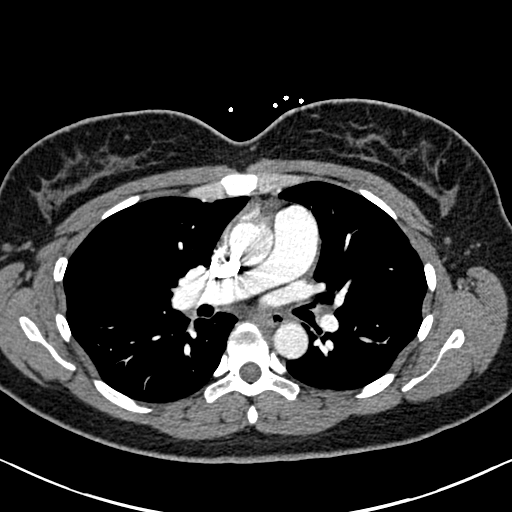
[im 142/236  lung]
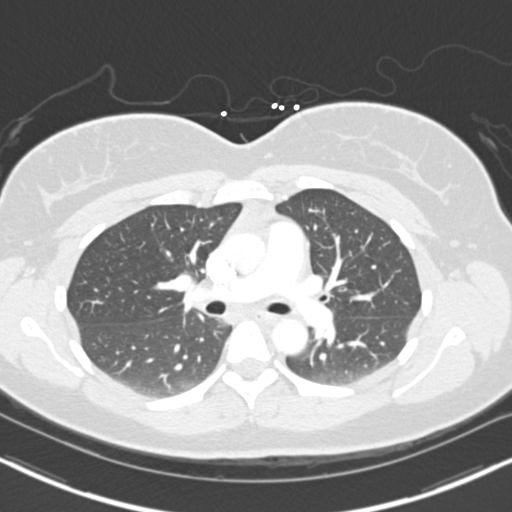
[im 153/236  mediastinal]
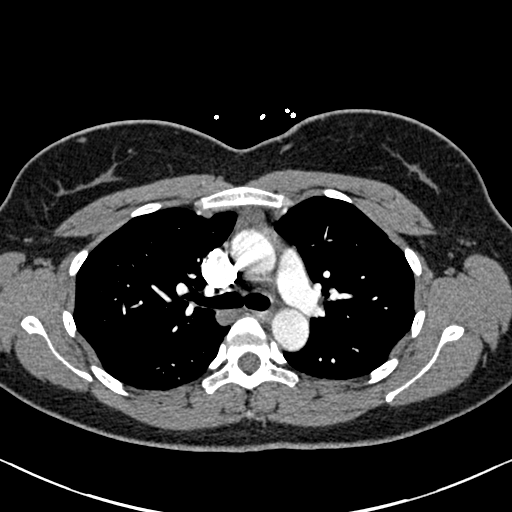
[im 165/236  lung]
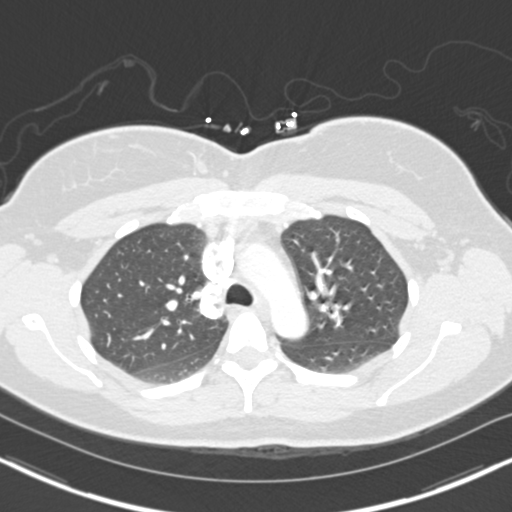
[im 189/236  mediastinal]
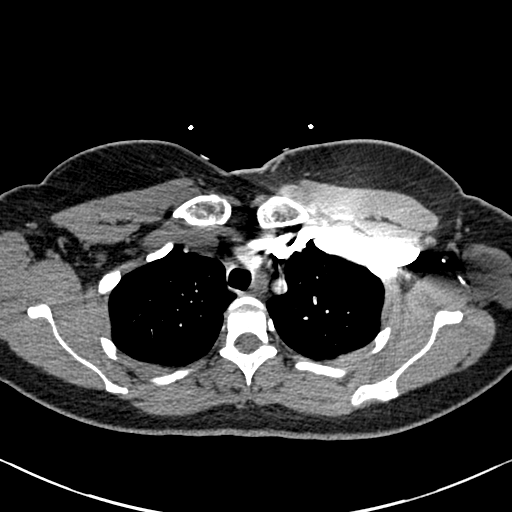
[im 200/236  lung]
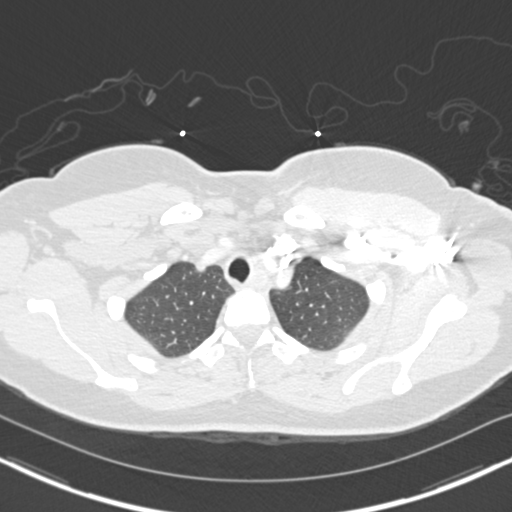
[im 212/236  mediastinal]
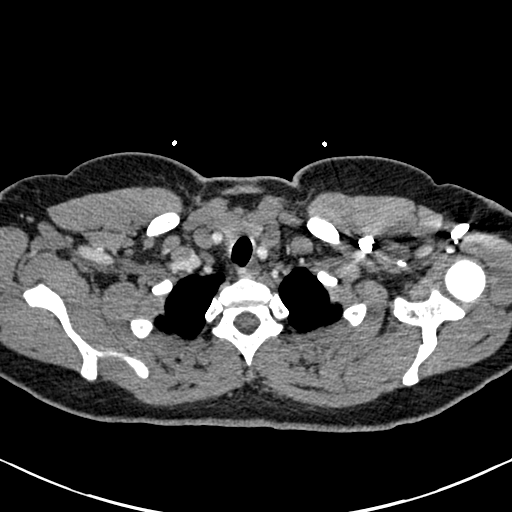
[im 224/236  lung]
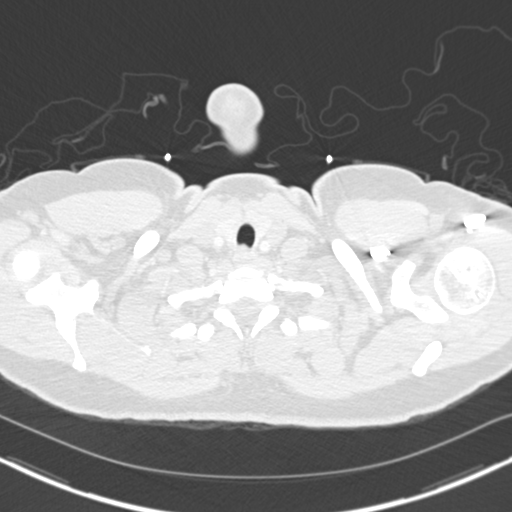

[Series 8: coronal mpr · coronal · 0.46mm/px · 1 of 127 slices shown]
[im 64/127  mediastinal]
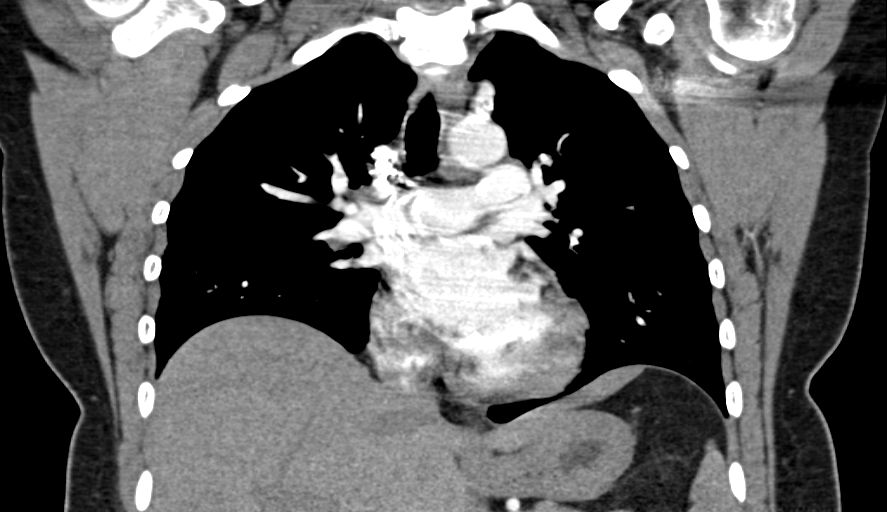

[18 of 36 positions shown; findings below may reference images not displayed]

FINDINGS: Mediastinum/Nodes: No breast masses, supraclavicular or axillary
lymphadenopathy. Small scattered lymph nodes are noted. The thyroid
gland is grossly normal.

The heart is normal in size. No pericardial effusion. Small amount
of residual thymic tissue is noted in the anterior mediastinum. No
mediastinal or hilar mass or adenopathy. The esophagus is normal.

The aorta is normal in caliber. No dissection. Branch vessels are
patent.

The pulmonary arterial tree is well opacified. No filling defects to
suggest pulmonary embolism.

Lungs/Pleura: The lungs are clear. No pleural effusion or pulmonary
nodule. No interstitial lung disease or bronchiectasis. The
tracheobronchial tree is unremarkable.

Upper abdomen: No significant findings.

Musculoskeletal: No significant findings.

Review of the MIP images confirms the above findings.
IMPRESSION: 1. No CT findings for pulmonary embolism.
2. Normal thoracic aorta.
3. No significant pulmonary findings.

## 2015-11-06 MED ORDER — AZITHROMYCIN 250 MG PO TABS: 250.0000 mg | ORAL_TABLET | Freq: Every day | ORAL | Status: AC

## 2015-11-06 MED ORDER — LIDOCAINE VISCOUS 2 % MT SOLN
15.0000 mL | Freq: Once | OROMUCOSAL | Status: AC
  Administered 2015-11-06: 15 mL via OROMUCOSAL
  Filled 2015-11-06: qty 15

## 2015-11-06 MED ORDER — LORAZEPAM 2 MG/ML IJ SOLN
0.5000 mg | Freq: Once | INTRAMUSCULAR | Status: DC
  Filled 2015-11-06: qty 1

## 2015-11-06 MED ORDER — IOHEXOL 350 MG/ML SOLN
100.0000 mL | Freq: Once | INTRAVENOUS | Status: AC | PRN
  Administered 2015-11-06: 100 mL via INTRAVENOUS

## 2015-11-06 MED ORDER — SODIUM CHLORIDE 0.9 % IV BOLUS (SEPSIS)
1000.0000 mL | Freq: Once | INTRAVENOUS | Status: AC
  Administered 2015-11-06: 1000 mL via INTRAVENOUS

## 2015-11-06 MED ORDER — LORAZEPAM 2 MG/ML IJ SOLN
0.5000 mg | Freq: Once | INTRAMUSCULAR | Status: AC
  Administered 2015-11-06: 0.5 mg via INTRAVENOUS
  Filled 2015-11-06: qty 1

## 2015-11-06 MED ORDER — LORAZEPAM 2 MG/ML IJ SOLN: 1.0000 mg | Freq: Once | INTRAMUSCULAR | Status: DC

## 2015-11-06 NOTE — ED Notes (Signed)
Patient d/c'd to home.  Reviewed f/u and medications.  Patient verbalized understanding. 

## 2015-11-06 NOTE — ED Notes (Signed)
Pt states she's had an on-going dry cough since August. States this is occasionally accompanied with SOB, right sided chest pain. Denies chest pain at the moment, N/V/D, fever chills.

## 2015-11-06 NOTE — ED Provider Notes (Signed)
CSN: [id number]     Arrival date & time 11/06/15  1249 History   First MD Initiated Contact with Patient 11/06/15 1308     Chief Complaint  Patient presents with  . Cough  . Shortness of Breath     (Consider location/radiation/quality/duration/timing/severity/associated sxs/prior Treatment) Patient is a 29 y.o. female presenting with cough and shortness of breath. The history is provided by the patient.  Cough Cough characteristics:  Non-productive Severity:  Severe Onset quality:  Gradual Duration: 4 months. Timing:  Constant Progression:  Worsening Chronicity:  New Smoker: no   Relieved by:  Nothing Worsened by:  Nothing tried Ineffective treatments:  None tried Associated symptoms: no chest pain, no chills, no fever, no headaches, no myalgias, no rhinorrhea, no shortness of breath and no wheezing   Shortness of Breath Associated symptoms: cough   Associated symptoms: no chest pain, no fever, no headaches, no vomiting and no wheezing    29 yo F with a chief complaint of cough. This been going on for the past 4 months. Patient has had fevers and chills off and on his however none recently. Patient denies any lower extremity edema denies recent long travel denies birth control use. Patient denies any chest pain. Has had some mild shortness of breath with this. Denies sick contacts. Denies history of prior blood clots. Patient quit smoking the year ago. Denies hypertension hyperlipidemia diabetes. She feels like her throat is closing in on her when she takes a big deep breath. Also has had some subjective paresthesias to bilateral lower lateral aspects of her legs. This has been seen by PCP and started on gabapentin with some relief.   Past Medical History  Diagnosis Date  . ITP (idiopathic thrombocytopenic purpura)   . ITP (idiopathic thrombocytopenic purpura)   . Dental caries   . Dental abscess   . NVD (normal vaginal delivery)    Past Surgical History  Procedure Laterality  Date  . Surgical repair to wrist    . Tubal ligation Bilateral 10/31/2013    Procedure: POST PARTUM TUBAL LIGATION;  Surgeon: Willodean Rosenthal, MD;  Location: [facility];  Service: Gynecology;  Laterality: Bilateral;  filshie clips used   Family History  Problem Relation Age of Onset  . Cancer Father     leukemia  . Fibroids Mother   . Thrombocytopenia Brother     ITP   Social History  Substance Use Topics  . Smoking status: Former Smoker -- 0.50 packs/day for 10 years    Types: Cigarettes    Quit date: 04/12/2013  . Smokeless tobacco: None  . Alcohol Use: No   OB History    Gravida Para Term Preterm AB TAB SAB Ectopic Multiple Living   0 Review of Systems  Constitutional: Negative for fever and chills.  HENT: Negative for congestion and rhinorrhea.   Eyes: Negative for redness and visual disturbance.  Respiratory: Positive for cough. Negative for shortness of breath and wheezing.   Cardiovascular: Negative for chest pain and palpitations.  Gastrointestinal: Negative for nausea and vomiting.  Genitourinary: Negative for dysuria and urgency.  Musculoskeletal: Negative for myalgias and arthralgias.  Skin: Negative for pallor and wound.  Neurological: Negative for dizziness and headaches.      Allergies  Review of patient's allergies indicates no known allergies.  Home Medications   Prior to Admission medications   Medication Sig Start Date End Date Taking? Authorizing Provider  Diphenhydramine-PE-APAP (  THERAFLU EXPRESSMAX) 12.5-5-325 MG/15ML LIQD Take 1 Dose by mouth daily as needed (cold symptoms).   Yes Historical Provider, MD  gabapentin (NEURONTIN) 300 MG capsule Take 300 mg by mouth 3 (three) times daily. 09/10/15  Yes Historical Provider, MD  ibuprofen (ADVIL,MOTRIN) 800 MG tablet Take 800 mg by mouth every 8 (eight) hours as needed for headache or moderate pain.   Yes Historical Provider, MD  Pseudoephedrine-APAP-DM 96-045-4030-325-15 MG/15ML LIQD  Take 1 Dose by mouth daily as needed (cold symptoms).   Yes Historical Provider, MD  RaNITidine HCl (ZANTAC PO) Take 1 tablet by mouth daily as needed (stomach acid).   Yes Historical Provider, MD  azithromycin (ZITHROMAX) 250 MG tablet Take 1 tablet (250 mg total) by mouth daily. Take first 2 tablets together, then 1 every day until finished. 11/06/15   Melene Planan Gaetan Spieker, DO   BP 106/63 mmHg  Pulse 84  Temp(Src) 97.9 F (36.6 C) (Oral)  Resp 16  SpO2 99%  LMP 10/17/2015 Physical Exam  Constitutional: She is oriented to person, place, and time. She appears well-developed and well-nourished. No distress.  HENT:  Head: Normocephalic and atraumatic.  Mouth/Throat:    Eyes: EOM are normal. Pupils are equal, round, and reactive to light.  Neck: Normal range of motion. Neck supple.  Cardiovascular: Regular rhythm.  Tachycardia present.  Exam reveals no gallop and no friction rub.   No murmur heard. Pulmonary/Chest: Effort normal. She has no wheezes. She has no rales.  Abdominal: Soft. She exhibits no distension. There is no tenderness.  Musculoskeletal: She exhibits no edema or tenderness.  Neurological: She is alert and oriented to person, place, and time.  Skin: Skin is warm and dry. She is not diaphoretic.  Psychiatric: She has a normal mood and affect. Her behavior is normal.  Nursing note and vitals reviewed.   ED Course  Procedures (including critical care time) Labs Review Labs Reviewed  BASIC METABOLIC PANEL - Abnormal; Notable for the following:    Potassium 3.4 (*)    All other components within normal limits  CBC - Abnormal; Notable for the following:    Platelets 112 (*)    All other components within normal limits  TSH  I-STAT TROPOININ, ED    Imaging Review Dg Chest 2 View  11/06/2015  CLINICAL DATA:  One week history of shortness of breath and cough EXAM: CHEST  2 VIEW COMPARISON:  May 16, 2008 FINDINGS: Lungs are clear. Heart size and pulmonary vascularity are normal.  No adenopathy. There is slight lower thoracic dextroscoliosis. IMPRESSION: No edema or consolidation. Electronically Signed   By: Bretta BangWilliam  Woodruff III M.D.   On: 11/06/2015 14:56   Ct Angio Chest Pe W/cm &/or Wo Cm  11/06/2015  CLINICAL DATA:  Persistent dry cough since August 2016. Occasional shortness of breath and right-sided chest pain. EXAM: CT ANGIOGRAPHY CHEST WITH CONTRAST TECHNIQUE: Multidetector CT imaging of the chest was performed using the standard protocol during bolus administration of intravenous contrast. Multiplanar CT image reconstructions and MIPs were obtained to evaluate the vascular anatomy. CONTRAST:  100mL OMNIPAQUE IOHEXOL 350 MG/ML SOLN COMPARISON:  None. FINDINGS: Mediastinum/Nodes: No breast masses, supraclavicular or axillary lymphadenopathy. Small scattered lymph nodes are noted. The thyroid gland is grossly normal. The heart is normal in size. No pericardial effusion. Small amount of residual thymic tissue is noted in the anterior mediastinum. No mediastinal or hilar mass or adenopathy. The esophagus is normal. The aorta is normal in caliber. No dissection. Branch vessels are patent. The  pulmonary arterial tree is well opacified. No filling defects to suggest pulmonary embolism. Lungs/Pleura: The lungs are clear. No pleural effusion or pulmonary nodule. No interstitial lung disease or bronchiectasis. The tracheobronchial tree is unremarkable. Upper abdomen: No significant findings. Musculoskeletal: No significant findings. Review of the MIP images confirms the above findings. IMPRESSION: 1. No CT findings for pulmonary embolism. 2. Normal thoracic aorta. 3. No significant pulmonary findings. Electronically Signed   By: Rudie Meyer M.D.   On: 11/06/2015 16:20   I have personally reviewed and evaluated these images and lab results as part of my medical decision-making.   EKG Interpretation   Date/Time:  Wednesday November 06 2015 13:00:26 EST Ventricular Rate:  128 PR  Interval:  73 QRS Duration: 121 QT Interval:  330 QTC Calculation: 481 R Axis:   0 Text Interpretation:  Sinus tachycardia Otherwise no significant change  Confirmed by Jahfari Ambers MD, Reuel Boom ([id number]) on 11/06/2015 1:09:45 PM      MDM   Final diagnoses:  Cough  URI (upper respiratory infection)    29 yo F with a chief complaint of cough and congestion. The going on for about 4 months. Patient is tachycardic into the 130s on arrival. Suspect some anxiety as a possible component of the tachycardia however will rule out PE with CT scan.  CT scan negative. Heart rate improved after fluid bolus and Ativan. Patient feeling mildly better after viscous lidocaine. Will treat for possible pertussis. DC home. PCP follow-up.  4:26 PM:  I have discussed the diagnosis/risks/treatment options with the patient and family and believe the pt to be eligible for discharge home to follow-up with PCP. We also discussed returning to the ED immediately if new or worsening sx occur. We discussed the sx which are most concerning (e.g., sudden worsening pain, fever, inability to tolerate by mouth) that necessitate immediate return. Medications administered to the patient during their visit and any new prescriptions provided to the patient are listed below.  Medications given during this visit Medications  sodium chloride 0.9 % bolus 1,000 mL (1,000 mLs Intravenous New Bag/Given 11/06/15 1342)  lidocaine (XYLOCAINE) 2 % viscous mouth solution 15 mL (15 mLs Mouth/Throat Given 11/06/15 1409)  LORazepam (ATIVAN) injection 0.5 mg (0.5 mg Intravenous Given 11/06/15 1409)  iohexol (OMNIPAQUE) 350 MG/ML injection 100 mL (100 mLs Intravenous Contrast Given 11/06/15 1603)    New Prescriptions   AZITHROMYCIN (ZITHROMAX) 250 MG TABLET    Take 1 tablet (250 mg total) by mouth daily. Take first 2 tablets together, then 1 every day until finished.    The patient appears reasonably screen and/or stabilized for discharge and I doubt any  other medical condition or other [facility] requiring further screening, evaluation, or treatment in the ED at this time prior to discharge.    Melene Plan, DO 11/06/15 1626

## 2015-11-06 NOTE — ED Notes (Signed)
Patient transported to CT 

## 2015-11-06 NOTE — ED Notes (Signed)
Patient transported to X-ray 

## 2015-11-06 NOTE — Discharge Instructions (Signed)

## 2018-05-30 ENCOUNTER — Emergency Department (HOSPITAL_COMMUNITY): Payer: BLUE CROSS/BLUE SHIELD

## 2018-05-30 ENCOUNTER — Other Ambulatory Visit: Payer: Self-pay

## 2018-05-30 ENCOUNTER — Encounter (HOSPITAL_COMMUNITY): Payer: Self-pay | Admitting: Emergency Medicine

## 2018-05-30 ENCOUNTER — Emergency Department (HOSPITAL_COMMUNITY)
Admission: EM | Admit: 2018-05-30 | Discharge: 2018-05-30 | Disposition: A | Discharge status: Home / Self Care | Payer: BLUE CROSS/BLUE SHIELD | Attending: Emergency Medicine | Admitting: Emergency Medicine

## 2018-05-30 DIAGNOSIS — Z79899 Other long term (current) drug therapy: Secondary | ICD-10-CM | POA: Insufficient documentation

## 2018-05-30 DIAGNOSIS — Y939 Activity, unspecified: Secondary | ICD-10-CM | POA: Insufficient documentation

## 2018-05-30 DIAGNOSIS — Y929 Unspecified place or not applicable: Secondary | ICD-10-CM | POA: Insufficient documentation

## 2018-05-30 DIAGNOSIS — S6991XA Unspecified injury of right wrist, hand and finger(s), initial encounter: Secondary | ICD-10-CM | POA: Diagnosis present

## 2018-05-30 DIAGNOSIS — Y999 Unspecified external cause status: Secondary | ICD-10-CM | POA: Diagnosis not present

## 2018-05-30 DIAGNOSIS — W228XXA Striking against or struck by other objects, initial encounter: Secondary | ICD-10-CM | POA: Insufficient documentation

## 2018-05-30 DIAGNOSIS — S61222A Laceration with foreign body of right middle finger without damage to nail, initial encounter: Secondary | ICD-10-CM | POA: Diagnosis not present

## 2018-05-30 DIAGNOSIS — Z87891 Personal history of nicotine dependence: Secondary | ICD-10-CM | POA: Insufficient documentation
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
## 2018-05-30 MED ORDER — MELOXICAM 15 MG PO TABS: 15.0000 mg | ORAL_TABLET | Freq: Every day | ORAL | 0 refills | Status: DC

## 2018-05-30 MED ORDER — LIDOCAINE HCL (PF) 1 % IJ SOLN
10.0000 mL | Freq: Once | INTRAMUSCULAR | Status: AC
  Administered 2018-05-30: 10 mL via INTRADERMAL
  Filled 2018-05-30: qty 10

## 2018-05-30 NOTE — Progress Notes (Signed)
Orthopedic Tech Progress Note Patient Details:  Pauline GoodFranconia Manfredo [birth year] [id number]  Ortho Devices Type of Ortho Device: Finger splint Ortho Device/Splint Location: rue Ortho Device/Splint Interventions: Application   Post Interventions Patient Tolerated: Well Instructions Provided: Care of device   Nikki DomCrawford, Nikolus Marczak 05/30/2018, 11:32 AM

## 2018-05-30 NOTE — ED Notes (Signed)
ED Provider at bedside. 
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
## 2018-05-30 NOTE — ED Notes (Signed)
Pt soaked hand for 20+ minutes. Hand removed from soak.
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
## 2018-05-30 NOTE — Discharge Instructions (Addendum)

## 2020-04-15 ENCOUNTER — Emergency Department (HOSPITAL_COMMUNITY): Payer: BC Managed Care – PPO

## 2020-04-15 ENCOUNTER — Emergency Department (HOSPITAL_COMMUNITY)
Admission: EM | Admit: 2020-04-15 | Discharge: 2020-04-15 | Disposition: A | Discharge status: Home / Self Care | Payer: BC Managed Care – PPO | Attending: Emergency Medicine | Admitting: Emergency Medicine

## 2020-04-15 ENCOUNTER — Other Ambulatory Visit: Payer: Self-pay

## 2020-04-15 ENCOUNTER — Encounter (HOSPITAL_COMMUNITY): Payer: Self-pay

## 2020-04-15 DIAGNOSIS — Z87891 Personal history of nicotine dependence: Secondary | ICD-10-CM | POA: Diagnosis not present

## 2020-04-15 DIAGNOSIS — Z79899 Other long term (current) drug therapy: Secondary | ICD-10-CM | POA: Insufficient documentation

## 2020-04-15 DIAGNOSIS — R4781 Slurred speech: Secondary | ICD-10-CM | POA: Diagnosis not present

## 2020-04-15 DIAGNOSIS — R200 Anesthesia of skin: Secondary | ICD-10-CM | POA: Diagnosis present

## 2020-04-15 DIAGNOSIS — R0789 Other chest pain: Secondary | ICD-10-CM | POA: Insufficient documentation

## 2020-04-15 DIAGNOSIS — M5124 Other intervertebral disc displacement, thoracic region: Secondary | ICD-10-CM | POA: Diagnosis not present

## 2020-04-15 DIAGNOSIS — R000 Tachycardia, unspecified: Secondary | ICD-10-CM | POA: Diagnosis not present

## 2020-04-15 DIAGNOSIS — F419 Anxiety disorder, unspecified: Secondary | ICD-10-CM | POA: Insufficient documentation

## 2020-04-15 DIAGNOSIS — D696 Thrombocytopenia, unspecified: Secondary | ICD-10-CM | POA: Diagnosis not present

## 2020-04-15 LAB — RAPID URINE DRUG SCREEN, HOSP PERFORMED
Amphetamines: NOT DETECTED
Barbiturates: NOT DETECTED
Benzodiazepines: NOT DETECTED
Cocaine: NOT DETECTED
Opiates: NOT DETECTED
Tetrahydrocannabinol: NOT DETECTED

## 2020-04-15 LAB — CBC
HCT: 40.6 % (ref 36.0–46.0)
Hemoglobin: 13.4 g/dL (ref 12.0–15.0)
MCH: 29.6 pg (ref 26.0–34.0)
MCHC: 33 g/dL (ref 30.0–36.0)
MCV: 89.8 fL (ref 80.0–100.0)
Platelets: 98 10*3/uL — ABNORMAL LOW (ref 150–400)
RBC: 4.52 MIL/uL (ref 3.87–5.11)
RDW: 13.2 % (ref 11.5–15.5)
WBC: 8.3 10*3/uL (ref 4.0–10.5)
nRBC: 0 % (ref 0.0–0.2)

## 2020-04-15 LAB — COMPREHENSIVE METABOLIC PANEL
ALT: 18 U/L (ref 0–44)
AST: 19 U/L (ref 15–41)
Albumin: 4 g/dL (ref 3.5–5.0)
Alkaline Phosphatase: 46 U/L (ref 38–126)
Anion gap: 10 (ref 5–15)
BUN: 12 mg/dL (ref 6–20)
CO2: 23 mmol/L (ref 22–32)
Calcium: 9 mg/dL (ref 8.9–10.3)
Chloride: 104 mmol/L (ref 98–111)
Creatinine, Ser: 0.82 mg/dL (ref 0.44–1.00)
GFR calc Af Amer: >60 mL/min (ref >60)
GFR calc non Af Amer: >60 mL/min (ref >60)
Glucose, Bld: 102 mg/dL — ABNORMAL HIGH (ref 70–99)
Potassium: 3.3 mmol/L — ABNORMAL LOW (ref 3.5–5.1)
Sodium: 137 mmol/L (ref 135–145)
Total Bilirubin: 0.3 mg/dL (ref 0.3–1.2)
Total Protein: 7.2 g/dL (ref 6.5–8.1)

## 2020-04-15 LAB — I-STAT BETA HCG BLOOD, ED (MC, WL, AP ONLY): I-stat hCG, quantitative: <5 m[IU]/mL (ref <5)

## 2020-04-15 LAB — URINALYSIS, ROUTINE W REFLEX MICROSCOPIC
Bilirubin Urine: NEGATIVE
Glucose, UA: NEGATIVE mg/dL
Ketones, ur: NEGATIVE mg/dL
Nitrite: NEGATIVE
Protein, ur: NEGATIVE mg/dL
Specific Gravity, Urine: 1.004 — ABNORMAL LOW (ref 1.005–1.030)
pH: 6 (ref 5.0–8.0)

## 2020-04-15 LAB — I-STAT CHEM 8, ED
BUN: 11 mg/dL (ref 6–20)
Calcium, Ion: 1.16 mmol/L (ref 1.15–1.40)
Chloride: 103 mmol/L (ref 98–111)
Creatinine, Ser: 0.8 mg/dL (ref 0.44–1.00)
Glucose, Bld: 97 mg/dL (ref 70–99)
HCT: 39 % (ref 36.0–46.0)
Hemoglobin: 13.3 g/dL (ref 12.0–15.0)
Potassium: 3.2 mmol/L — ABNORMAL LOW (ref 3.5–5.1)
Sodium: 139 mmol/L (ref 135–145)
TCO2: 26 mmol/L (ref 22–32)

## 2020-04-15 LAB — DIFFERENTIAL
Abs Immature Granulocytes: 0.02 10*3/uL (ref 0.00–0.07)
Basophils Absolute: 0.1 10*3/uL (ref 0.0–0.1)
Basophils Relative: 1 %
Eosinophils Absolute: 0.2 10*3/uL (ref 0.0–0.5)
Eosinophils Relative: 2 %
Immature Granulocytes: 0 %
Lymphocytes Relative: 28 %
Lymphs Abs: 2.3 10*3/uL (ref 0.7–4.0)
Monocytes Absolute: 0.5 10*3/uL (ref 0.1–1.0)
Monocytes Relative: 6 %
Neutro Abs: 5.2 10*3/uL (ref 1.7–7.7)
Neutrophils Relative %: 63 %

## 2020-04-15 LAB — TROPONIN I (HIGH SENSITIVITY): Troponin I (High Sensitivity): <2 ng/L (ref <18)

## 2020-04-15 LAB — ETHANOL: Alcohol, Ethyl (B): <10 mg/dL (ref <10)

## 2020-04-15 LAB — PROTIME-INR
INR: 1.1 (ref 0.8–1.2)
Prothrombin Time: 13.6 seconds (ref 11.4–15.2)

## 2020-04-15 LAB — APTT: aPTT: 28 seconds (ref 24–36)

## 2020-04-15 IMAGING — CT CT HEAD CODE STROKE
3 series · 15 of 47 positions shown, 18 images · non-contrast
Comparison: Face CT [DATE].
COMPARISON: Face CT [DATE].

Addendum:
CLINICAL DATA: Code stroke. 34-year-old female with left arm
weakness and numbness since [A5] hours.

EXAM:
CT HEAD WITHOUT CONTRAST
TECHNIQUE: Contiguous axial images were obtained from the base of the skull
through the vertex without intravenous contrast.

[Series 2: head wo · axial · 0.43mm/px · z∈[-156,-21]mm · 9 of 33 slices shown, 12 images]
[im 3/33  brain]
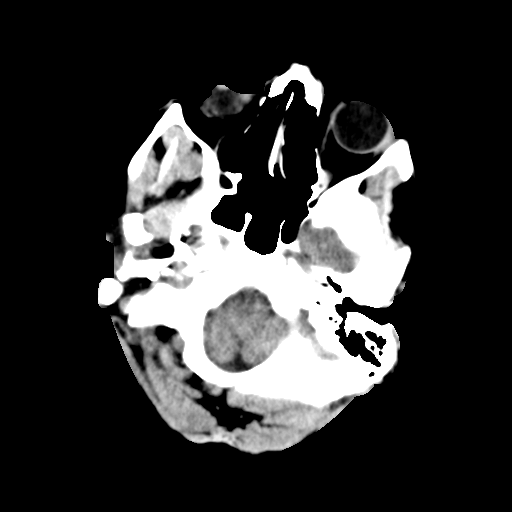
[im 3/33  bone]
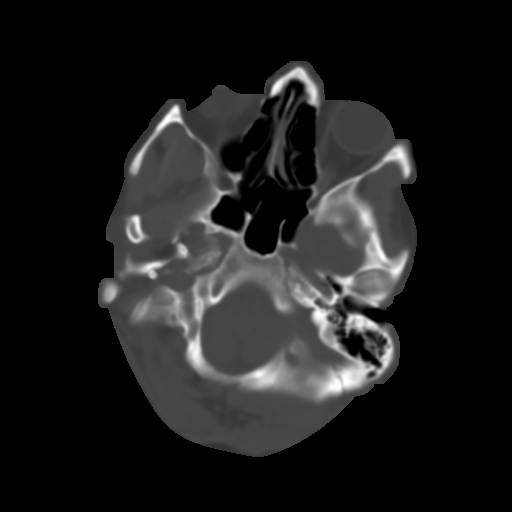
[im 6/33  brain]
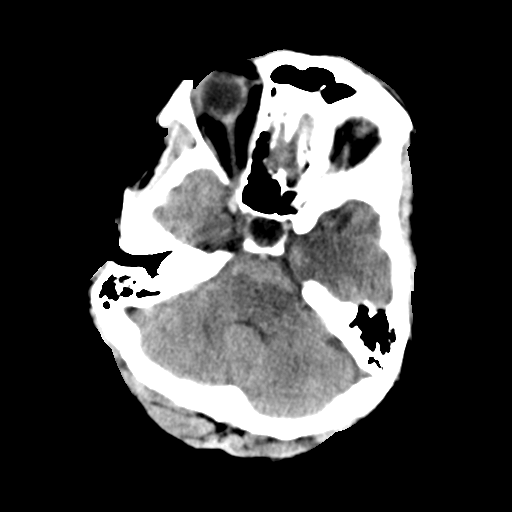
[im 9/33  brain]
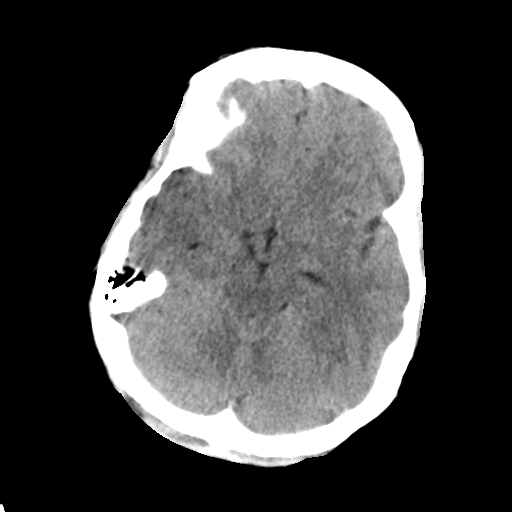
[im 13/33  brain]
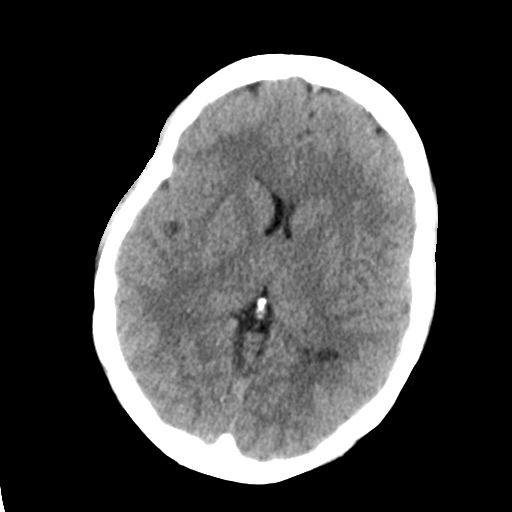
[im 17/33  brain]
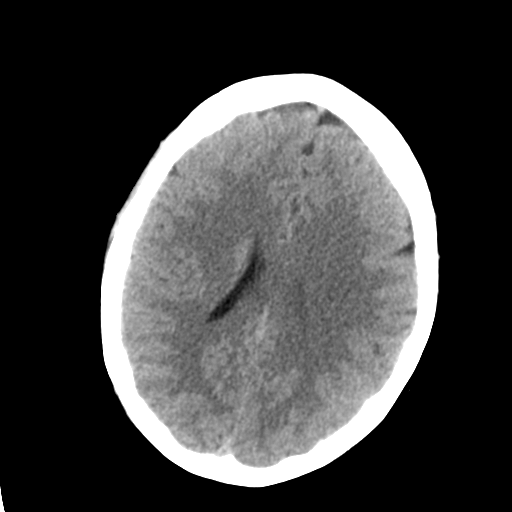
[im 17/33  bone]
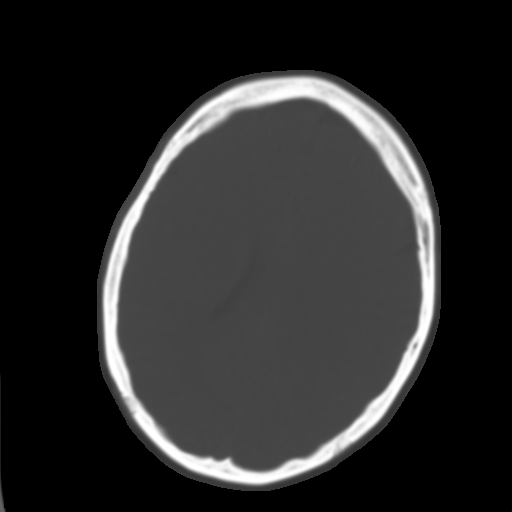
[im 20/33  brain]
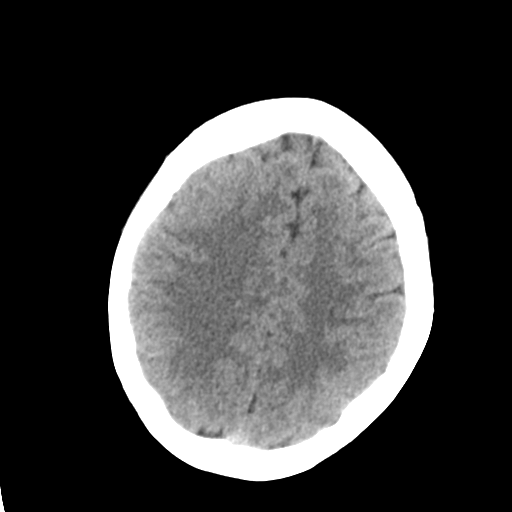
[im 24/33  brain]
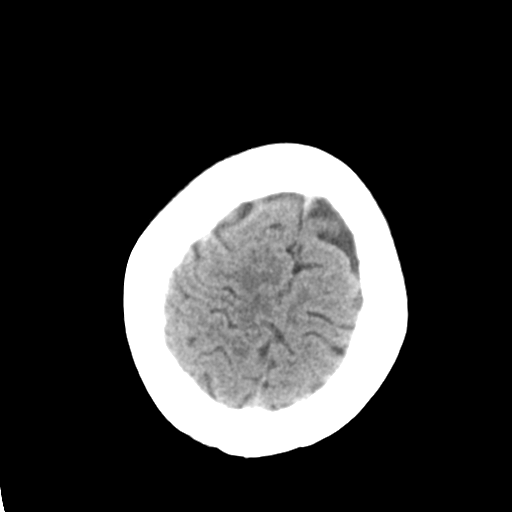
[im 27/33  brain]
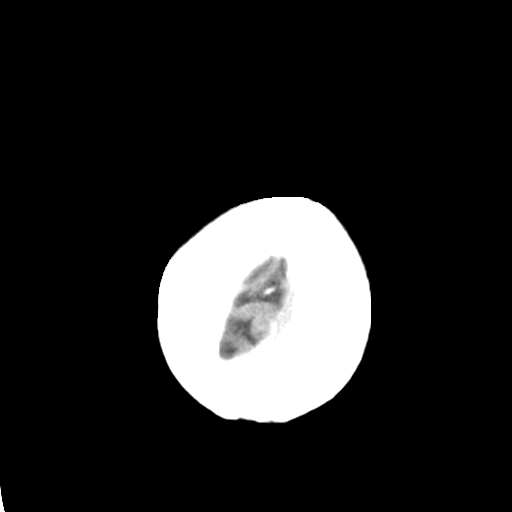
[im 30/33  brain]
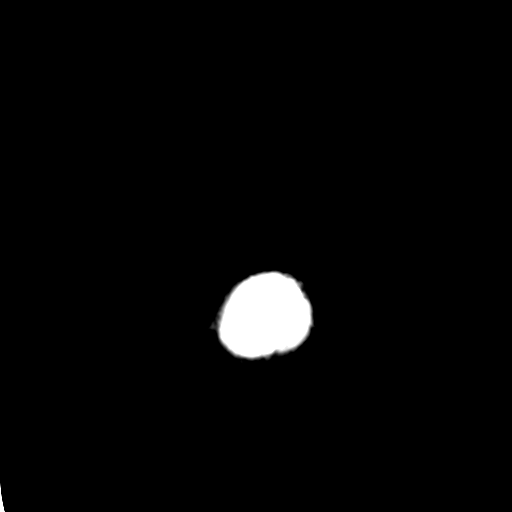
[im 30/33  bone]
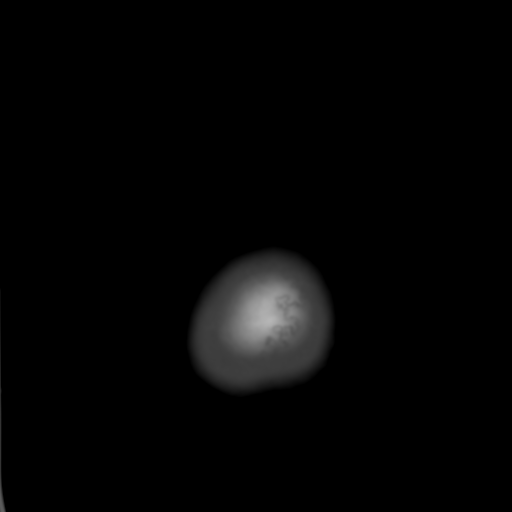

[Series 5: sagittal soft tissue · sagittal · 0.33mm/px · 3 of 59 slices shown]
[im 20/59  brain]
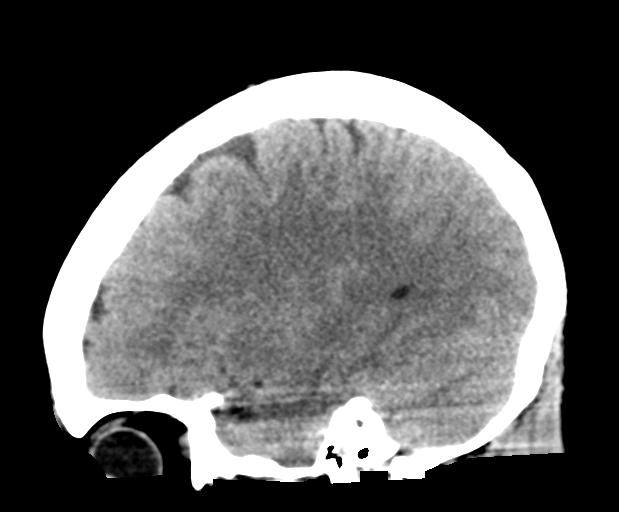
[im 30/59  brain]
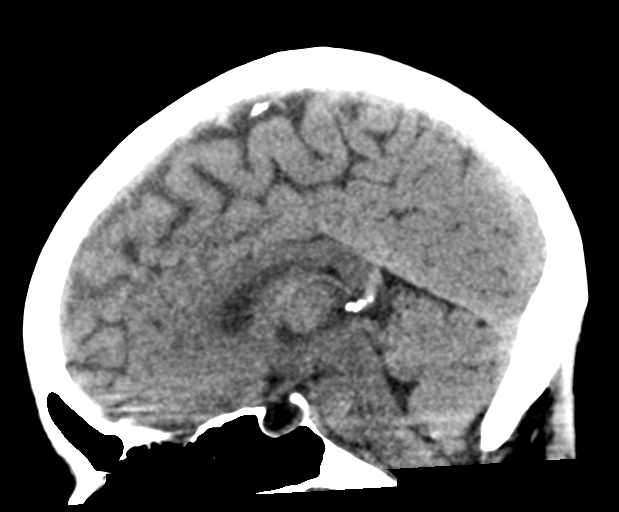
[im 39/59  brain]
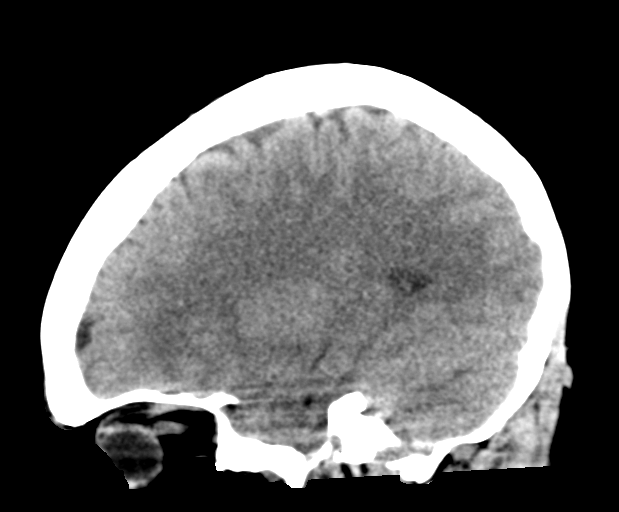

[Series 6: coronal soft tissue · coronal · 0.33mm/px · 3 of 73 slices shown]
[im 25/73  brain]
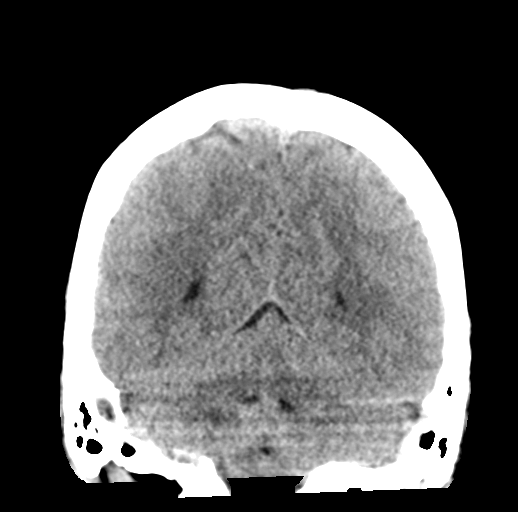
[im 33/73  brain]
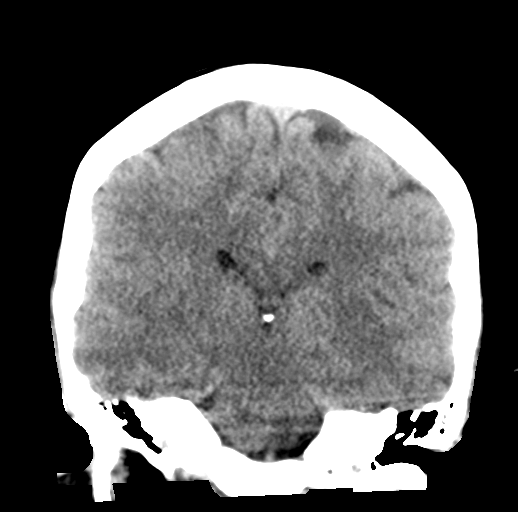
[im 41/73  brain]
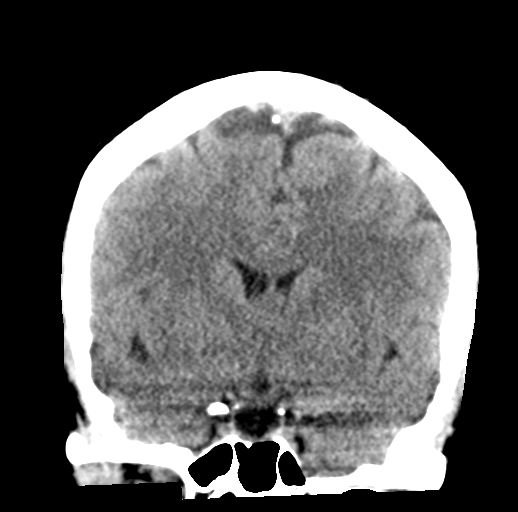

[15 of 47 positions shown; findings below may reference images not displayed]

FINDINGS: Brain: Normal cerebral volume. No midline shift, ventriculomegaly,
mass effect, evidence of mass lesion, intracranial hemorrhage or
evidence of cortically based acute infarction. Gray-white matter
differentiation is within normal limits throughout the brain.
Partially empty sella, similar to the [A5] CT.

Vascular: Mild Calcified atherosclerosis at the skull base. No
suspicious intracranial vascular hyperdensity.

Skull: Negative.

Sinuses/Orbits: Visualized paranasal sinuses and mastoids are stable
and well pneumatized.

Other: Visualized orbits and scalp soft tissues are within normal
limits.

ASPECTS (Alberta Stroke Program Early CT Score)

Total score (0-10 with 10 being normal): 10
IMPRESSION: 1. No acute intracranial hemorrhage or cortically based infarct
identified. ASPECTS 10.
2. Normal noncontrast CT appearance of the brain aside from chronic
partially empty sella, often a normal anatomic variant but can be
associated with idiopathic intracranial hypertension (pseudotumor
cerebri)

ADDENDUM:
Study discussed by telephone with Dr. BANTI ISILAM on [DATE]

*** End of Addendum ***
FINDINGS: Brain: Normal cerebral volume. No midline shift, ventriculomegaly,
mass effect, evidence of mass lesion, intracranial hemorrhage or
evidence of cortically based acute infarction. Gray-white matter
differentiation is within normal limits throughout the brain.
Partially empty sella, similar to the [A5] CT.

Vascular: Mild Calcified atherosclerosis at the skull base. No
suspicious intracranial vascular hyperdensity.

Skull: Negative.

Sinuses/Orbits: Visualized paranasal sinuses and mastoids are stable
and well pneumatized.

Other: Visualized orbits and scalp soft tissues are within normal
limits.

ASPECTS (Alberta Stroke Program Early CT Score)

Total score (0-10 with 10 being normal): 10
IMPRESSION: 1. No acute intracranial hemorrhage or cortically based infarct
identified. ASPECTS 10.
2. Normal noncontrast CT appearance of the brain aside from chronic
partially empty sella, often a normal anatomic variant but can be
associated with idiopathic intracranial hypertension (pseudotumor
cerebri)

## 2020-04-15 IMAGING — MR MR HEAD WO/W CM
9 of 10 series · 40 of 48 positions shown · IV contrast (gadavist)
Comparison: CT head without contrast [DATE]

CLINICAL DATA: Acute onset of left-sided weakness and numbness and
pain. Symptoms began at 4 a.m.

EXAM:
MRI HEAD WITHOUT AND WITH CONTRAST
TECHNIQUE: Multiplanar, multiecho pulse sequences of the brain and surrounding
structures were obtained without and with intravenous contrast.
CONTRAST:  7mL GADAVIST GADOBUTROL 1 MMOL/ML IV SOLN

[Series 5: T2 · sagittal · 5.0mm · 0.47mm/px · 3 of 24 slices shown (1 of 3)]
[im 1/24]
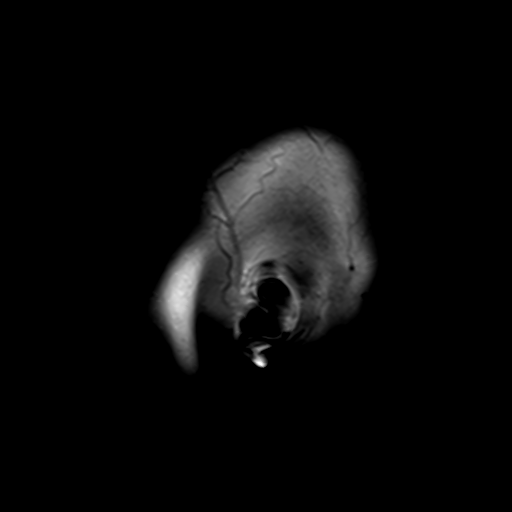
[im 12/24]
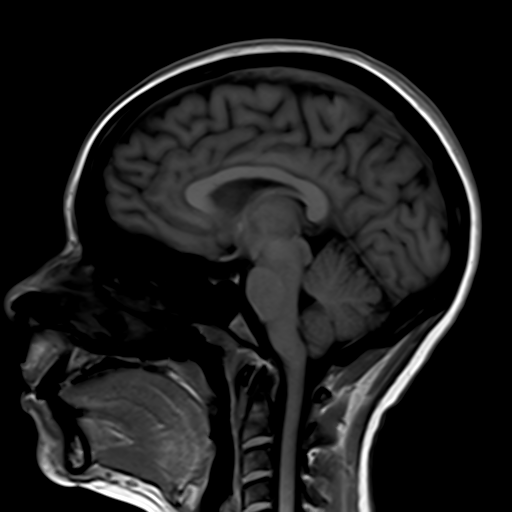
[im 24/24]
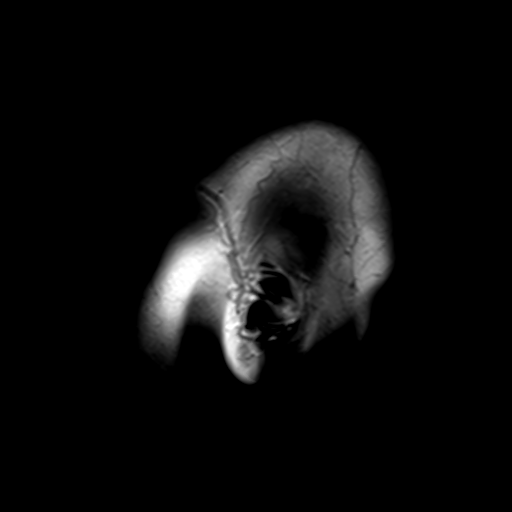

[Series 6: T2 · axial · 4.0mm · 0.45mm/px · z∈[-45,+116]mm · 3 of 32 slices shown (2 of 3)]
[im 1/32]
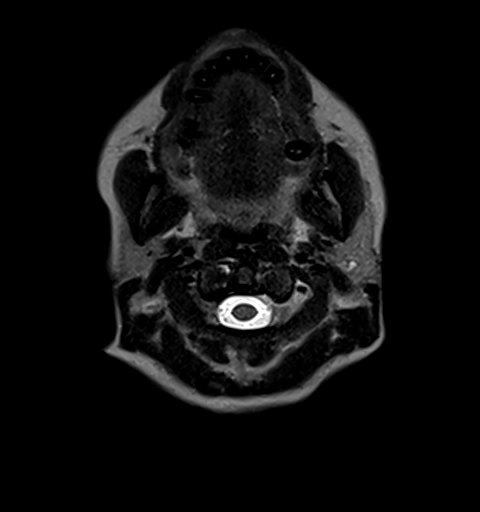
[im 16/32]
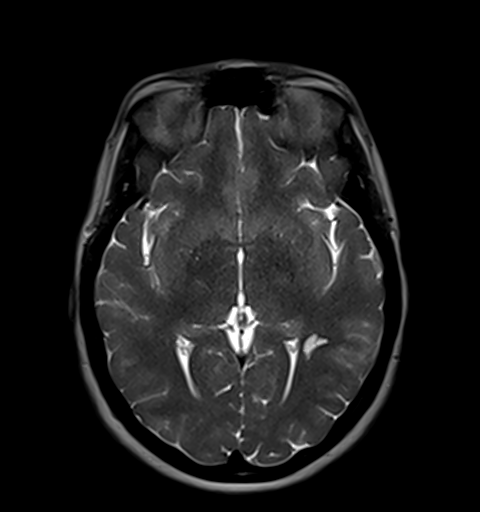
[im 32/32]
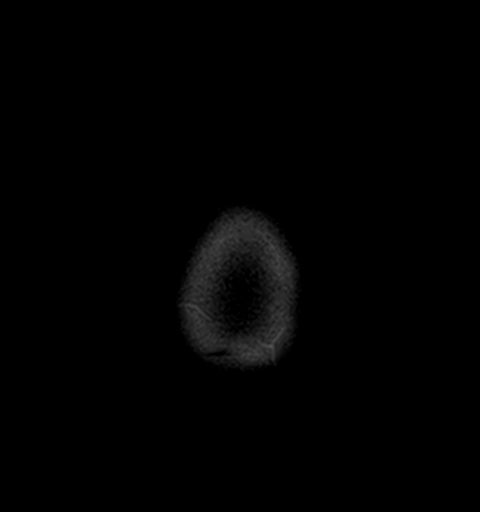

[Series 7: dwi_tracew · axial · 3.0mm · 1.08mm/px · z∈[-30,+101]mm · 7 of 102 slices shown]
[im 1/102]
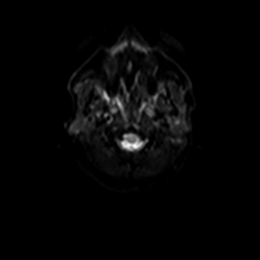
[im 12/102]
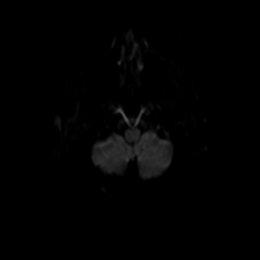
[im 34/102]
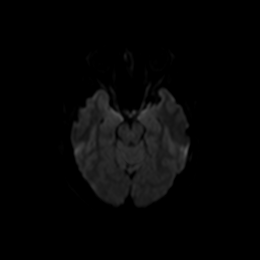
[im 45/102]
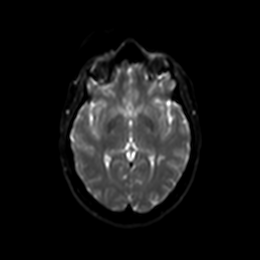
[im 57/102]
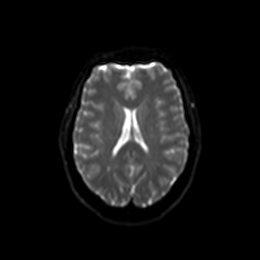
[im 68/102]
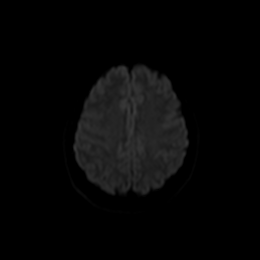
[im 90/102]
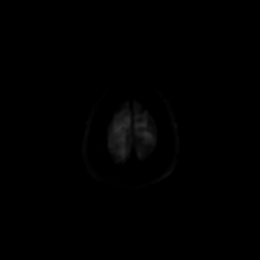

[Series 9: GRE · axial · 3.0mm · 0.45mm/px · z∈[-32,+118]mm · 5 of 51 slices shown]
[im 1/51]
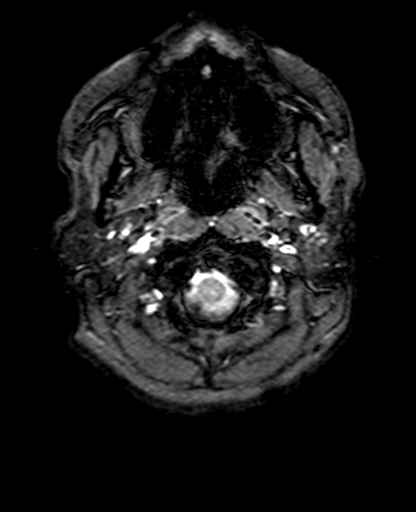
[im 13/51]
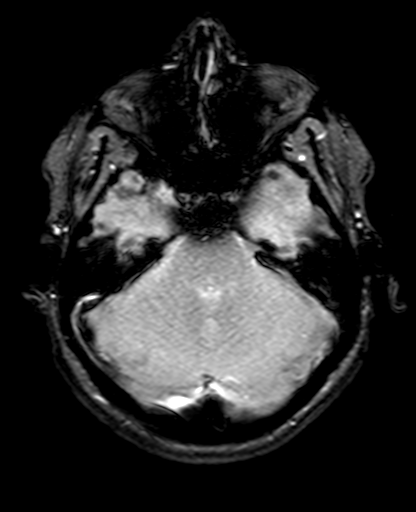
[im 26/51]
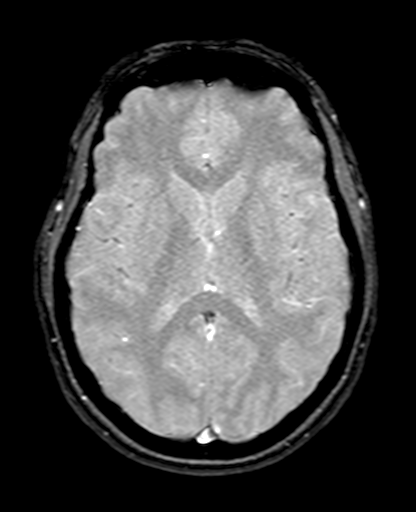
[im 38/51]
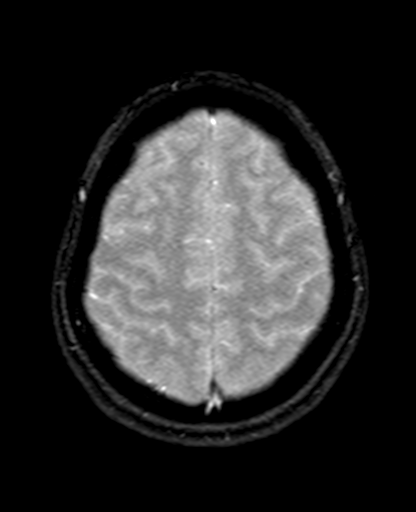
[im 51/51]
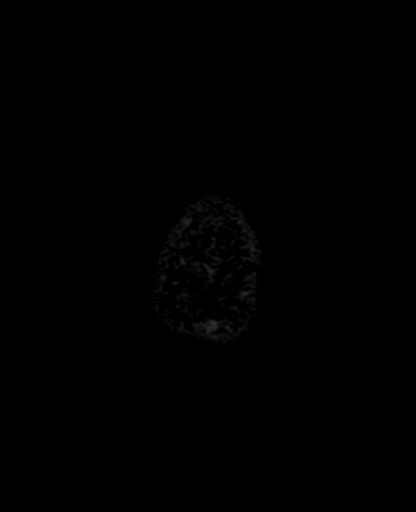

[Series 10: FLAIR · axial · 3.0mm · 0.86mm/px · z∈[-37,+113]mm · 5 of 51 slices shown]
[im 1/51]
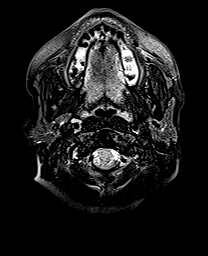
[im 13/51]
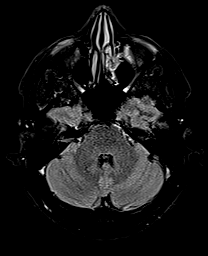
[im 26/51]
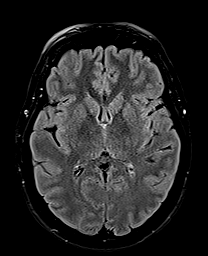
[im 38/51]
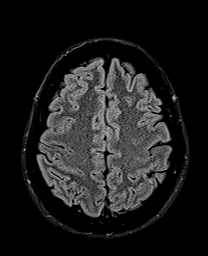
[im 51/51]
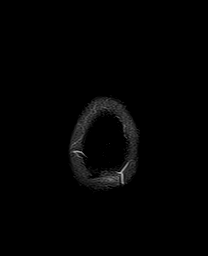

[Series 11: T1 · axial · 3.0mm · 0.45mm/px · z∈[-32,+118]mm · 5 of 51 slices shown]
[im 1/51]
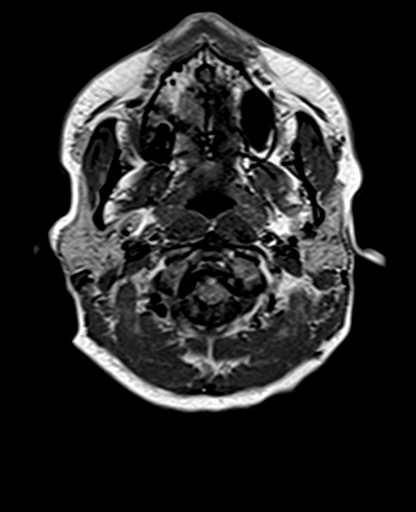
[im 13/51]
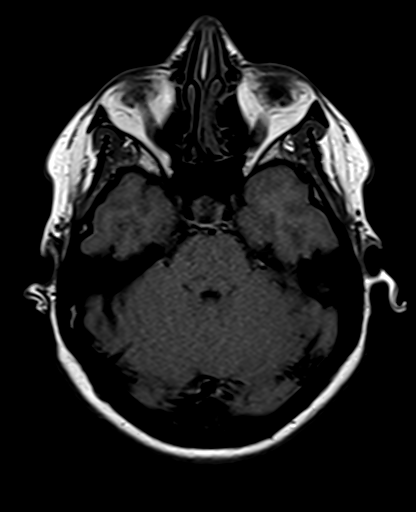
[im 26/51]
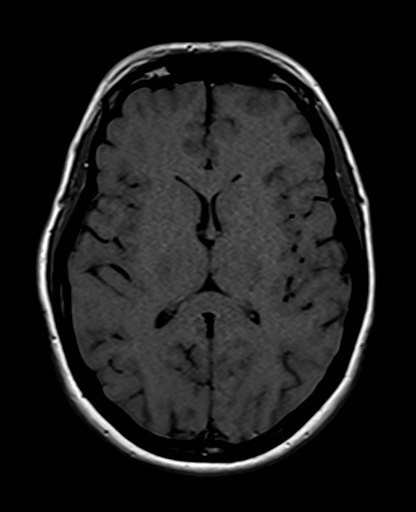
[im 38/51]
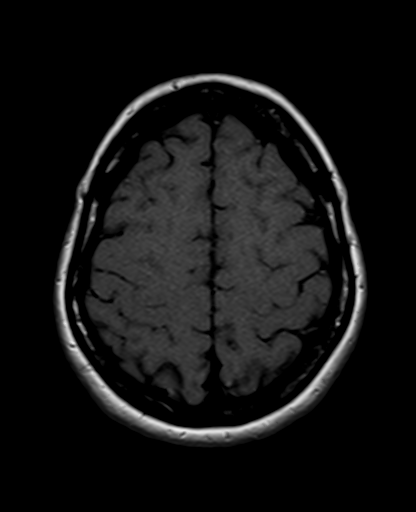
[im 51/51]
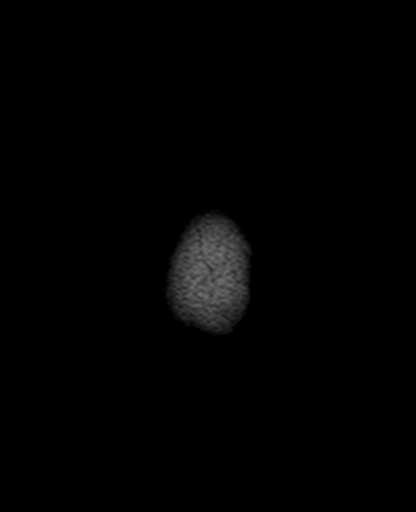

[Series 12: DWI · coronal · 5.0mm · 1.31mm/px · 6 of 56 slices shown (1 of 2)]
[im 1/56]
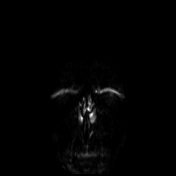
[im 12/56]
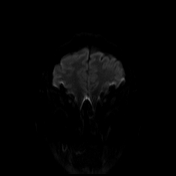
[im 23/56]
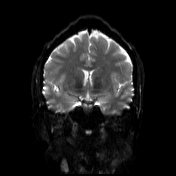
[im 34/56]
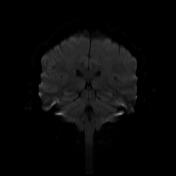
[im 45/56]
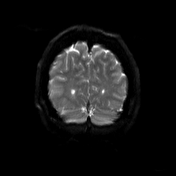
[im 56/56]
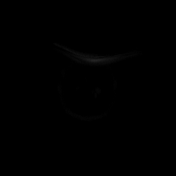

[Series 13: DWI · coronal · 5.0mm · 1.31mm/px · 3 of 28 slices shown (2 of 2)]
[im 1/28]
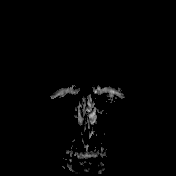
[im 14/28]
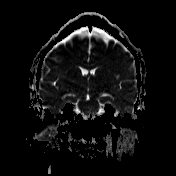
[im 28/28]
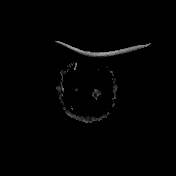

[Series 14: T2 · coronal · 5.0mm · 0.86mm/px · 3 of 26 slices shown (3 of 3)]
[im 1/26]
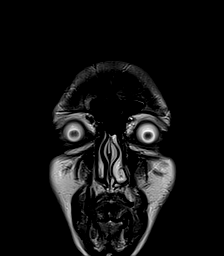
[im 13/26]
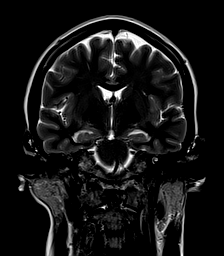
[im 26/26]
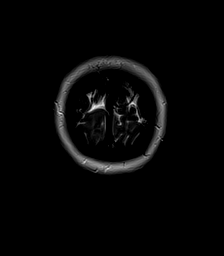

[40 of 48 positions shown; findings below may reference images not displayed]

FINDINGS: Brain: The diffusion-weighted images demonstrate no acute or
subacute infarction. No acute hemorrhage or mass lesion is present.
No significant white matter lesions are present. The ventricles are
of normal size. No significant extraaxial fluid collection is
present.

The internal auditory canals are within normal limits. The brainstem
and cerebellum are within normal limits.

Vascular: Flow is present in the major intracranial arteries.

Skull and upper cervical spine: The craniocervical junction is
normal. Upper cervical spine is within normal limits. Marrow signal
is unremarkable.

Sinuses/Orbits: The paranasal sinuses and mastoid air cells are
clear. The globes and orbits are within normal limits.
IMPRESSION: Normal MRI of the brain.

## 2020-04-15 IMAGING — MR MR HEAD WO/W CM
2 series · 48 of 48 positions shown · IV contrast (gadavist)
Comparison: CT head without contrast [DATE]

CLINICAL DATA: Acute onset of left-sided weakness and numbness and
pain. Symptoms began at 4 a.m.

EXAM:
MRI HEAD WITHOUT AND WITH CONTRAST
TECHNIQUE: Multiplanar, multiecho pulse sequences of the brain and surrounding
structures were obtained without and with intravenous contrast.
CONTRAST:  7mL GADAVIST GADOBUTROL 1 MMOL/ML IV SOLN

[Series 9: T1 post-contrast · axial · 1.0mm · 0.94mm/px · z∈[+54,+197]mm · 40 of 144 slices shown (1 of 2)]
[im 1/144]
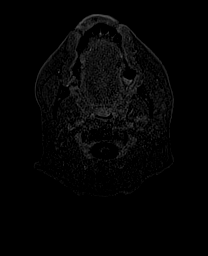
[im 4/144]
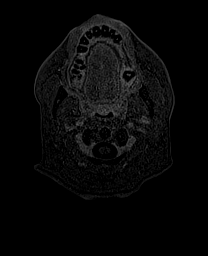
[im 8/144]
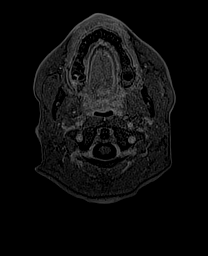
[im 12/144]
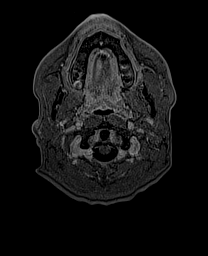
[im 15/144]
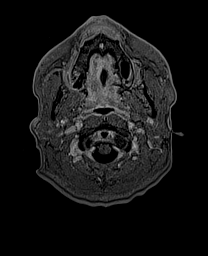
[im 19/144]
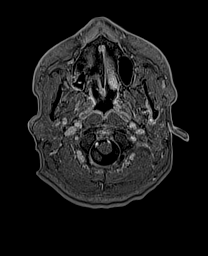
[im 23/144]
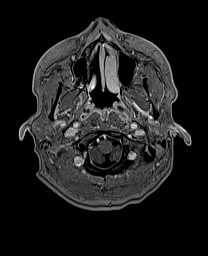
[im 26/144]
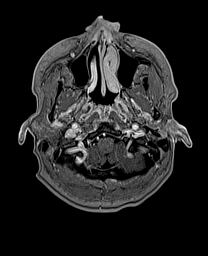
[im 30/144]
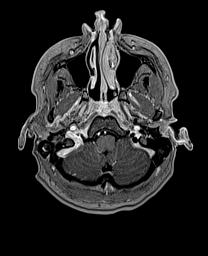
[im 34/144]
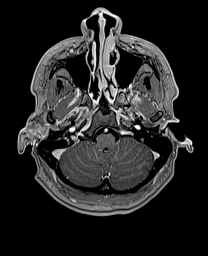
[im 37/144]
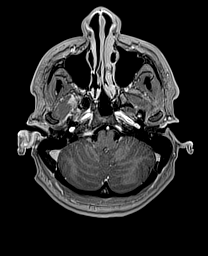
[im 41/144]
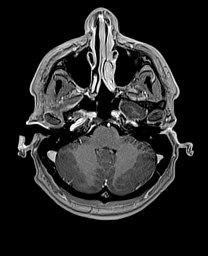
[im 45/144]
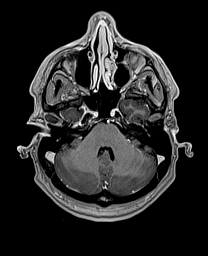
[im 48/144]
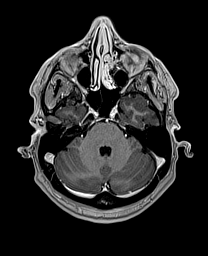
[im 52/144]
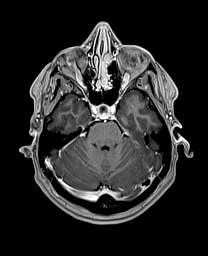
[im 56/144]
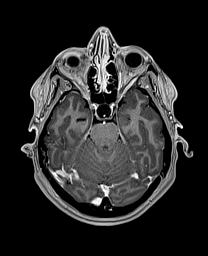
[im 59/144]
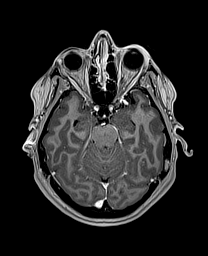
[im 63/144]
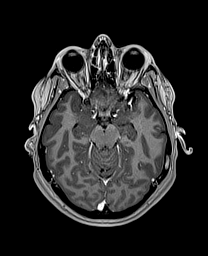
[im 67/144]
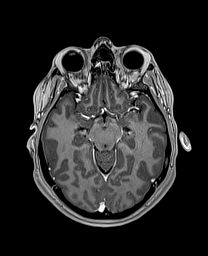
[im 70/144]
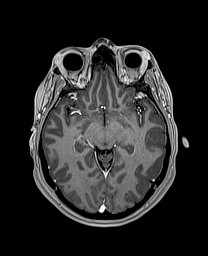
[im 74/144]
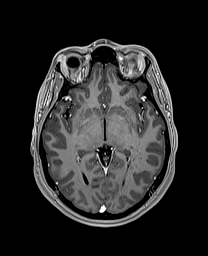
[im 78/144]
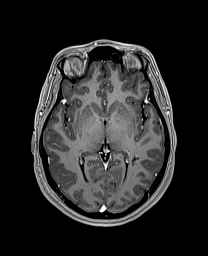
[im 81/144]
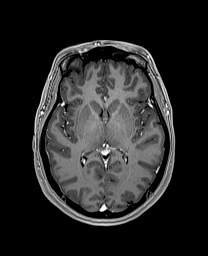
[im 85/144]
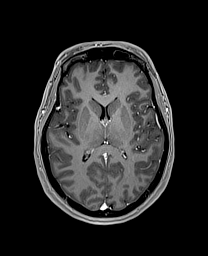
[im 89/144]
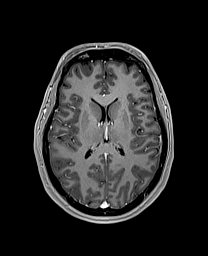
[im 92/144]
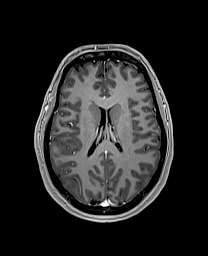
[im 96/144]
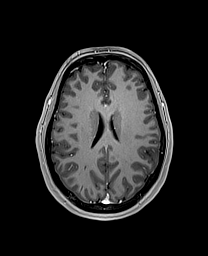
[im 100/144]
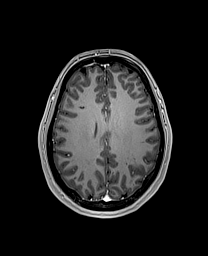
[im 103/144]
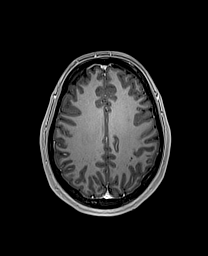
[im 107/144]
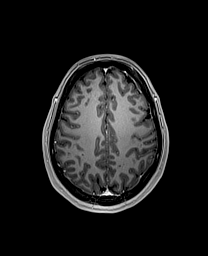
[im 111/144]
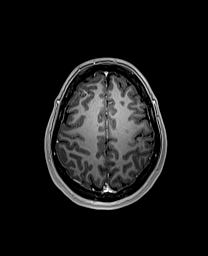
[im 114/144]
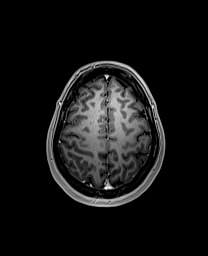
[im 118/144]
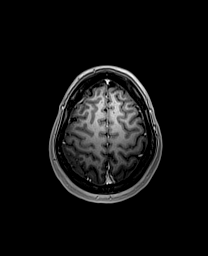
[im 122/144]
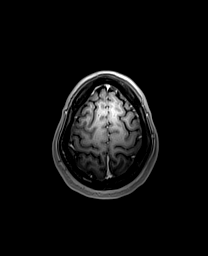
[im 125/144]
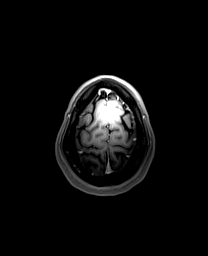
[im 129/144]
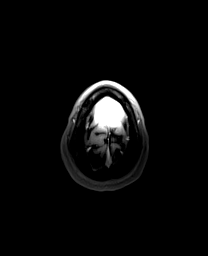
[im 133/144]
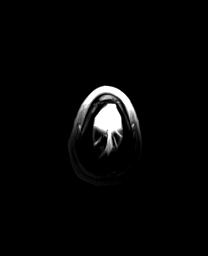
[im 136/144]
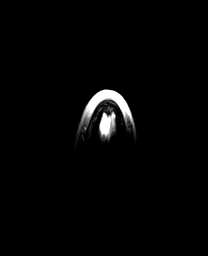
[im 140/144]
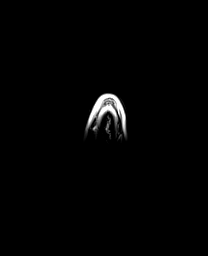
[im 144/144]
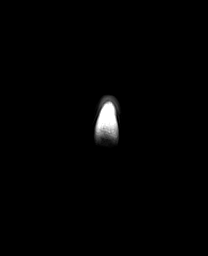

[Series 10: T1 post-contrast · coronal · 5.0mm · 0.43mm/px · 8 of 28 slices shown (2 of 2)]
[im 1/28]
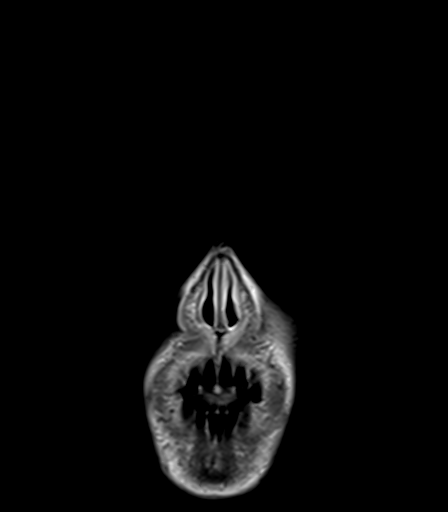
[im 4/28]
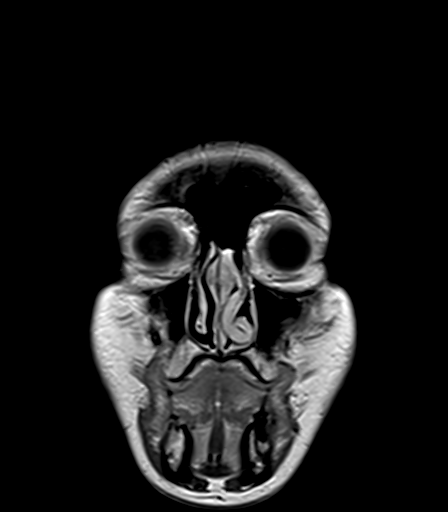
[im 8/28]
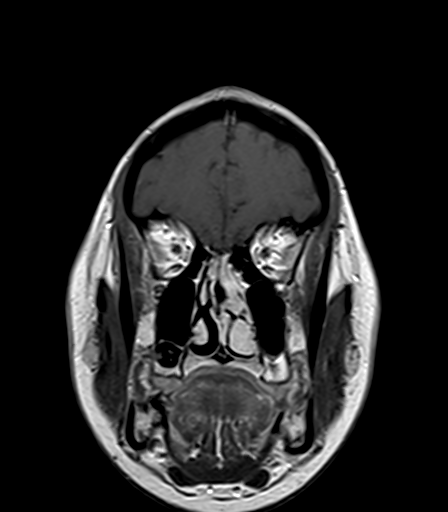
[im 12/28]
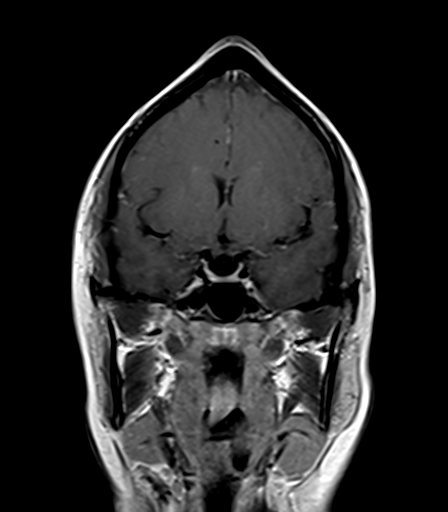
[im 16/28]
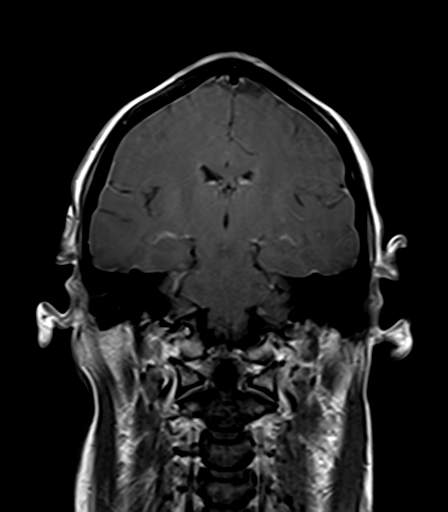
[im 20/28]
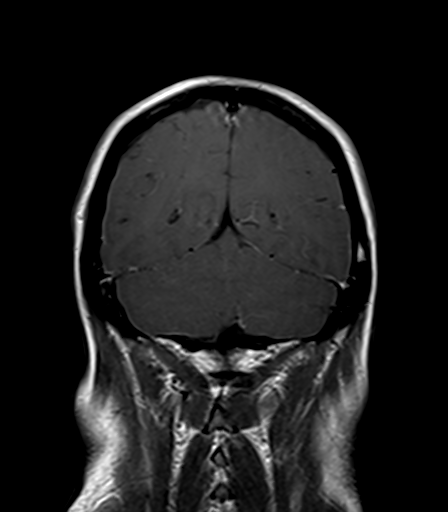
[im 24/28]
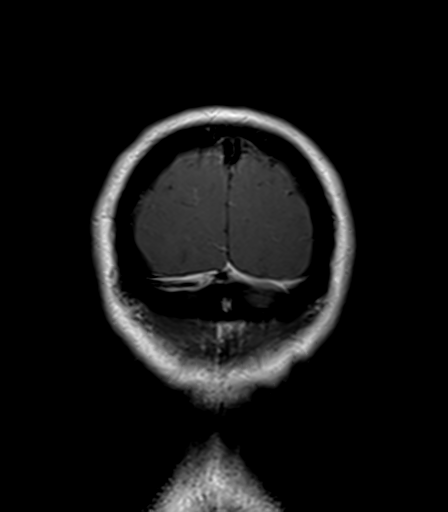
[im 28/28]
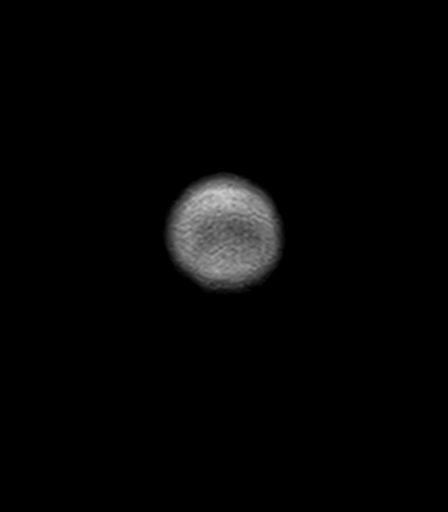

[48 of 48 positions shown; findings below may reference images not displayed]

FINDINGS: Brain: The diffusion-weighted images demonstrate no acute or
subacute infarction. No acute hemorrhage or mass lesion is present.
No significant white matter lesions are present. The ventricles are
of normal size. No significant extraaxial fluid collection is
present.

The internal auditory canals are within normal limits. The brainstem
and cerebellum are within normal limits.

Vascular: Flow is present in the major intracranial arteries.

Skull and upper cervical spine: The craniocervical junction is
normal. Upper cervical spine is within normal limits. Marrow signal
is unremarkable.

Sinuses/Orbits: The paranasal sinuses and mastoid air cells are
clear. The globes and orbits are within normal limits.
IMPRESSION: Normal MRI of the brain.

## 2020-04-15 IMAGING — CT CT CERVICAL SPINE W/O CM
3 of 4 series · 10 of 33 positions shown, 12 images · non-contrast
Comparison: Head CT today. Face CT [DATE].

CLINICAL DATA: Code stroke. 34-year-old female with left arm
weakness and numbness since [Y5] hours.

EXAM:
CT CERVICAL SPINE WITHOUT CONTRAST
TECHNIQUE: Multidetector CT imaging of the cervical spine was performed without
intravenous contrast. Multiplanar CT image reconstructions were also
generated.

[Series 6: orthogonal bone · axial · 0.23mm/px · z∈[-305,-219]mm · 2 of 110 slices shown, 3 images]
[im 32/110  soft-tissue]
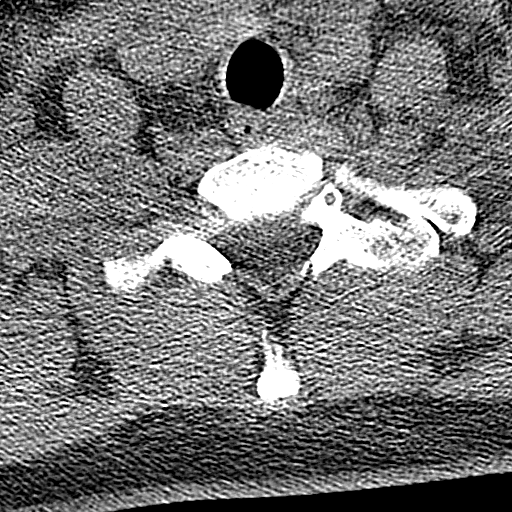
[im 32/110  bone]
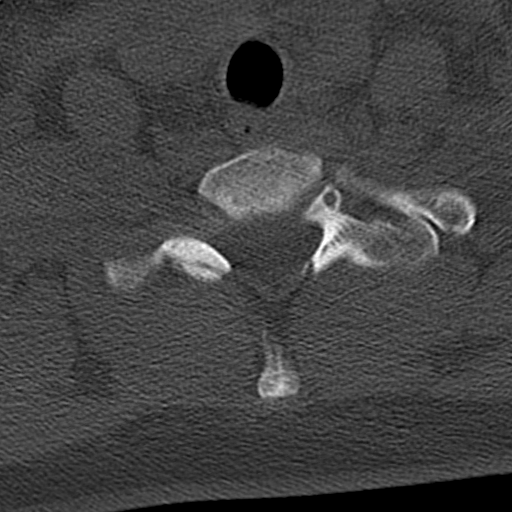
[im 78/110  bone]
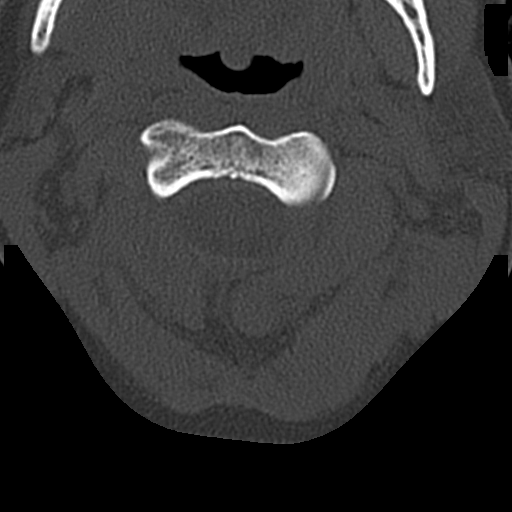

[Series 7: coronal bone · coronal · 0.22mm/px · 3 of 61 slices shown]
[im 13/61  bone]
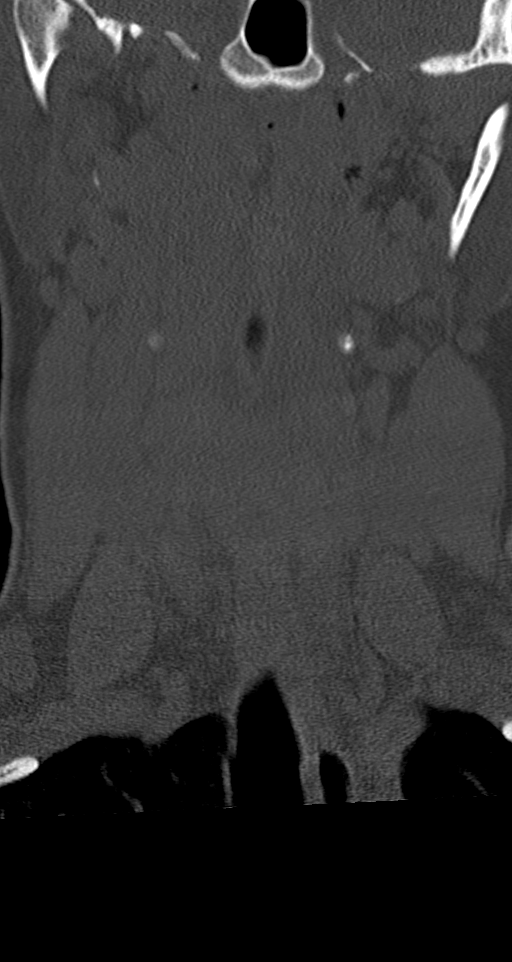
[im 25/61  bone]
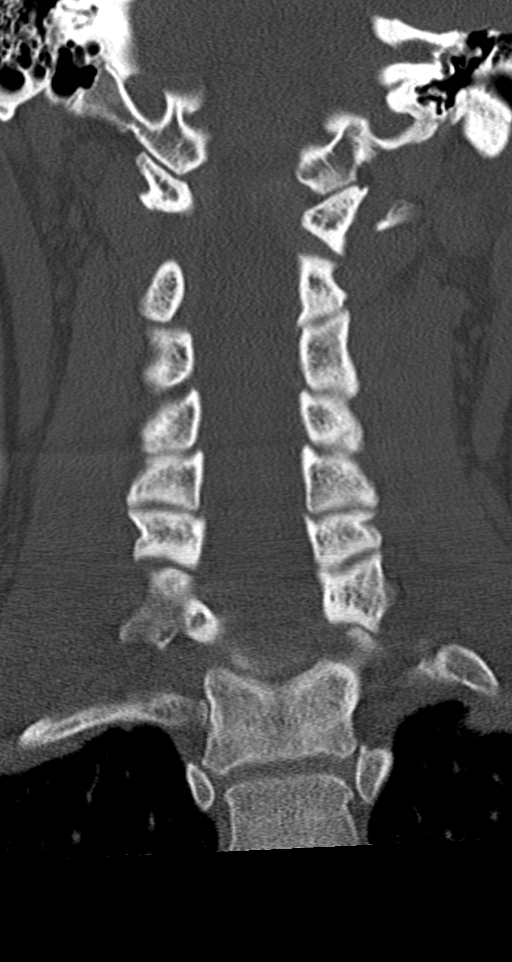
[im 37/61  bone]
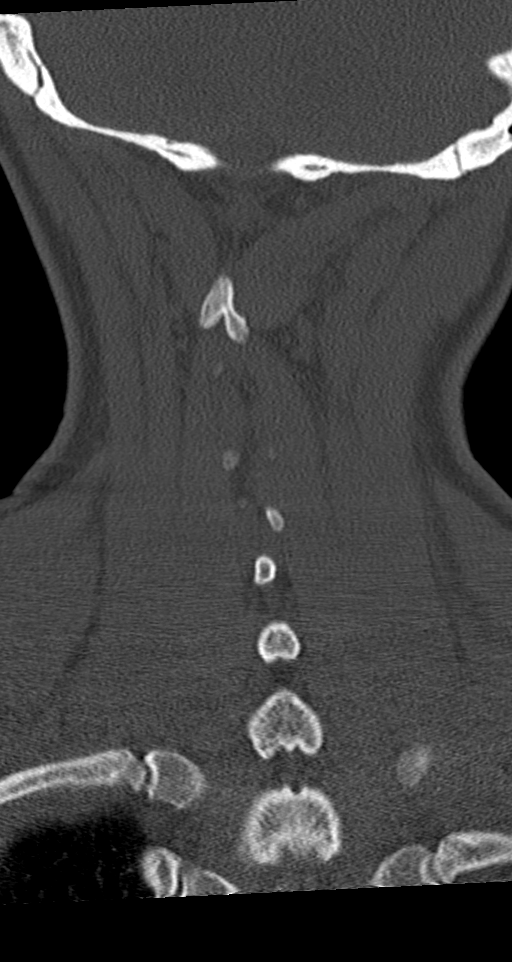

[Series 8: sagittal bone · sagittal · 0.25mm/px · 5 of 56 slices shown, 6 images]
[im 19/56  bone]
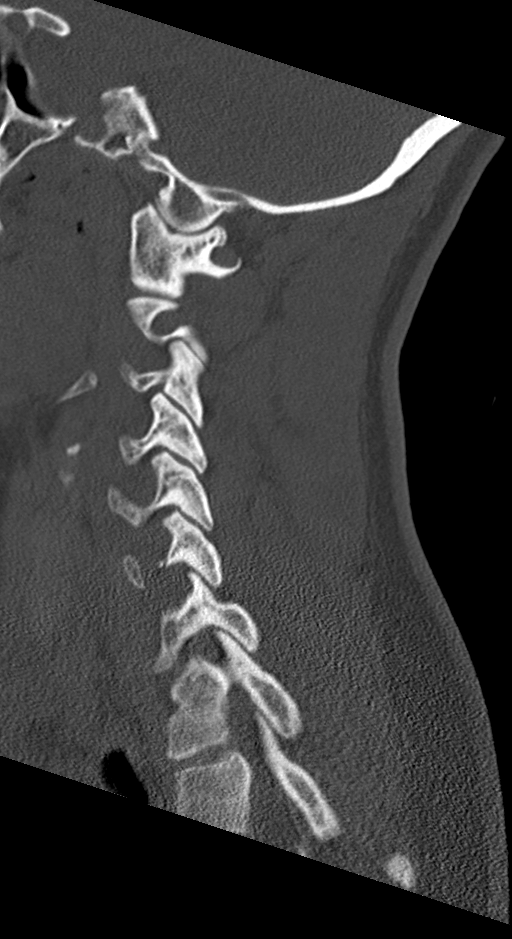
[im 23/56  bone]
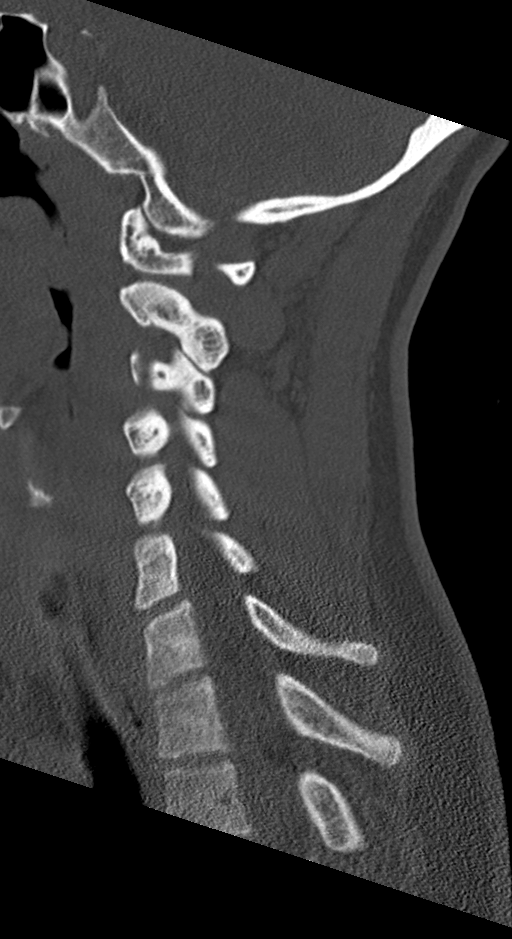
[im 28/56  soft-tissue]
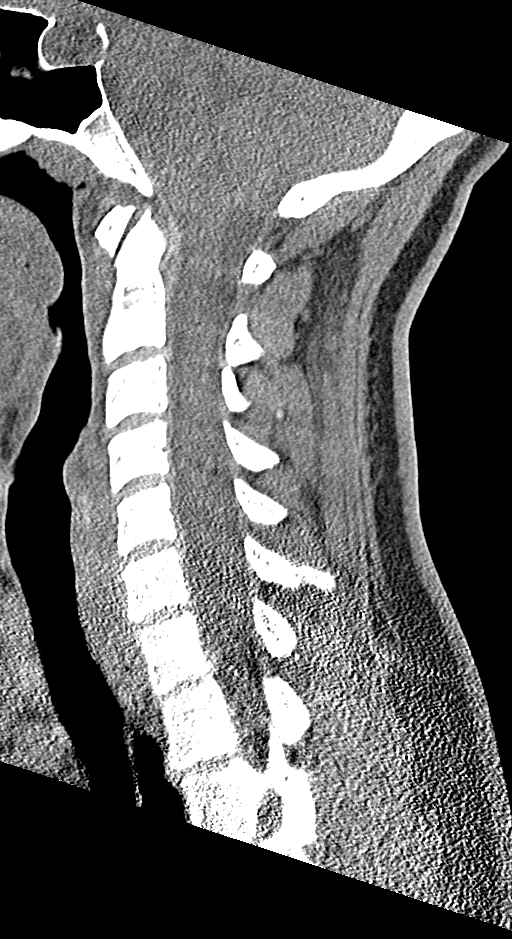
[im 28/56  bone]
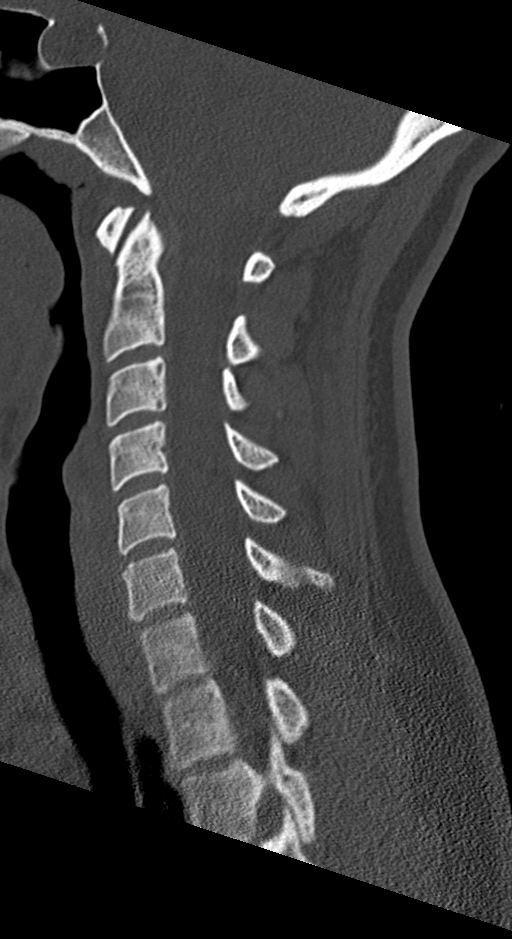
[im 33/56  bone]
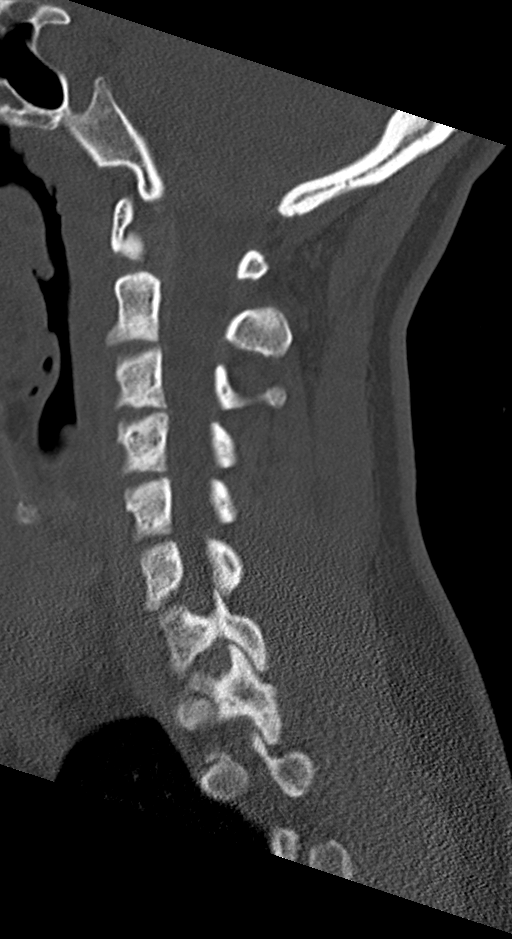
[im 37/56  bone]
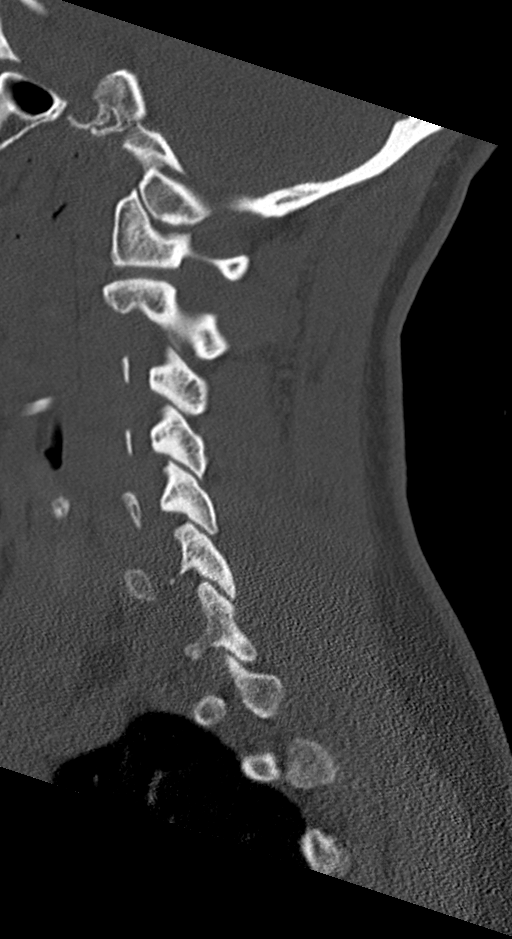

[10 of 33 positions shown; findings below may reference images not displayed]

FINDINGS: Alignment: Relatively preserved cervical lordosis. Cervicothoracic
junction alignment is within normal limits. Bilateral posterior
element alignment is within normal limits. Mild dextroconvex
cervical spine curvature.

Skull base and vertebrae: Visualized skull base is intact. No
atlanto-occipital dissociation. No acute osseous abnormality
identified.

Soft tissues and spinal canal: No prevertebral fluid or swelling. No
visible canal hematoma. Negative noncontrast neck soft tissues.

Disc levels: No spinal stenosis or significant degenerative changes
identified.

Upper chest: Negative.
IMPRESSION: No acute osseous abnormality identified and negative CT appearance
of the cervical spine.

Study discussed by telephone with Dr. GOGONEBO on [DATE]

## 2020-04-15 IMAGING — MR MR CERVICAL SPINE WO/W CM
8 of 9 series · 28 of 48 positions shown · IV contrast (gadavist)
Comparison: None.

CLINICAL DATA: Acute onset of left upper extremity numbness and
weakness beginning at 4 a.m. today.

EXAM:
MRI CERVICAL SPINE WITHOUT AND WITH CONTRAST
TECHNIQUE: Multiplanar and multiecho pulse sequences of the cervical spine, to
include the craniocervical junction and cervicothoracic junction,
were obtained without and with intravenous contrast.
CONTRAST:  7mL GADAVIST GADOBUTROL 1 MMOL/ML IV SOLN

[Series 5: T1 · sagittal · 3.0mm · 0.69mm/px · 3 of 14 slices shown (1 of 2)]
[im 1/14]
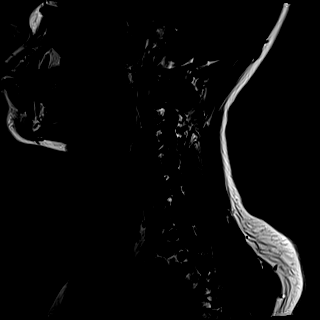
[im 7/14]
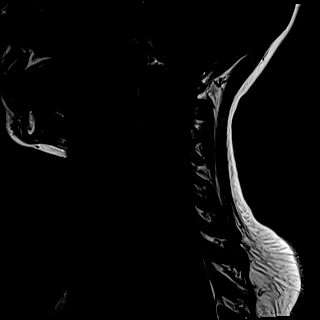
[im 14/14]
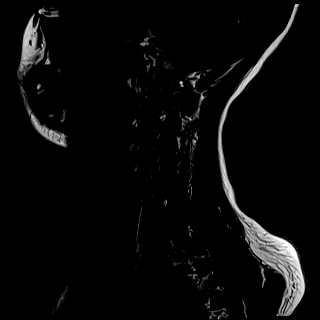

[Series 6: STIR · sagittal · 3.0mm · 0.86mm/px · 3 of 14 slices shown]
[im 1/14]
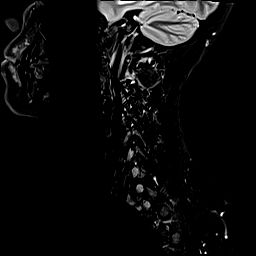
[im 7/14]
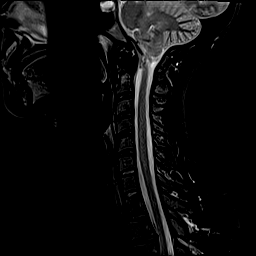
[im 14/14]
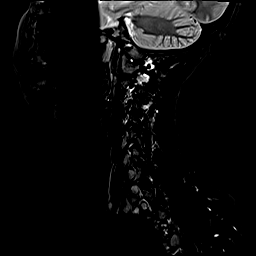

[Series 7: T2 · axial · 3.0mm · 0.70mm/px · z∈[-174,-73]mm · 5 of 30 slices shown]
[im 1/30]
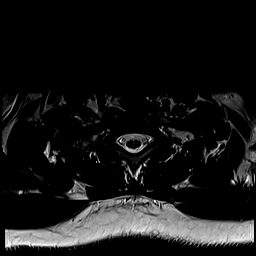
[im 8/30]
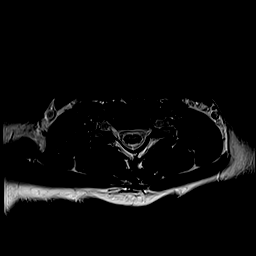
[im 15/30]
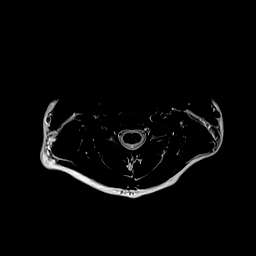
[im 22/30]
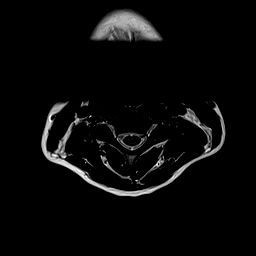
[im 30/30]
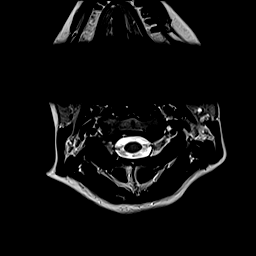

[Series 8: GRE · axial · 3.0mm · 0.35mm/px · z∈[-174,-125]mm · 3 of 30 slices shown]
[im 1/30]
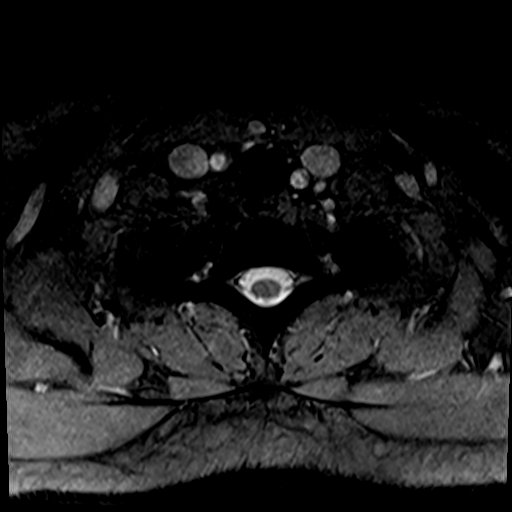
[im 8/30]
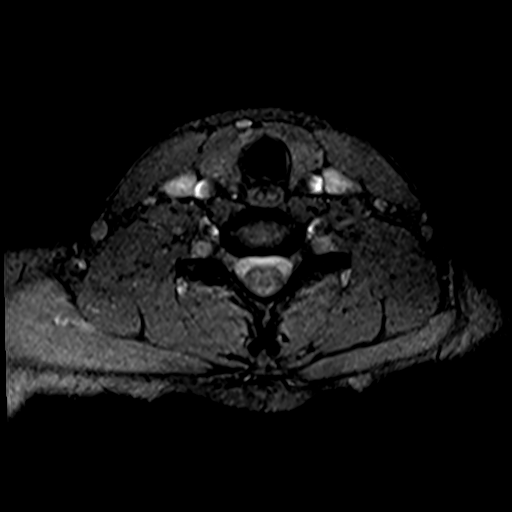
[im 15/30]
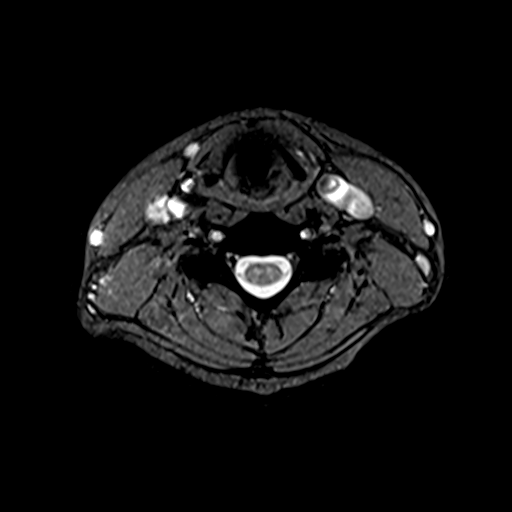

[Series 9: T1 · axial · 3.0mm · 0.35mm/px · z∈[-174,-73]mm · 5 of 30 slices shown (2 of 2)]
[im 1/30]
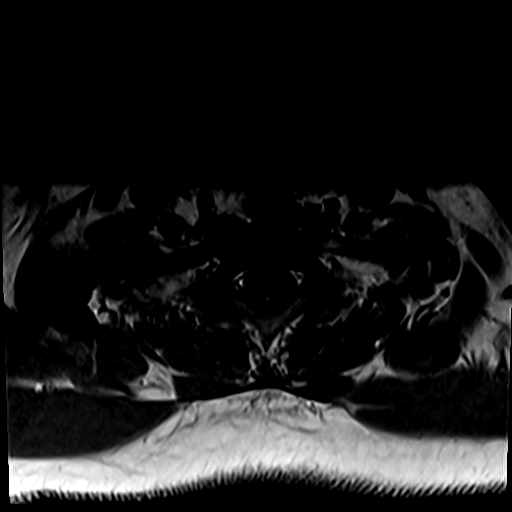
[im 8/30]
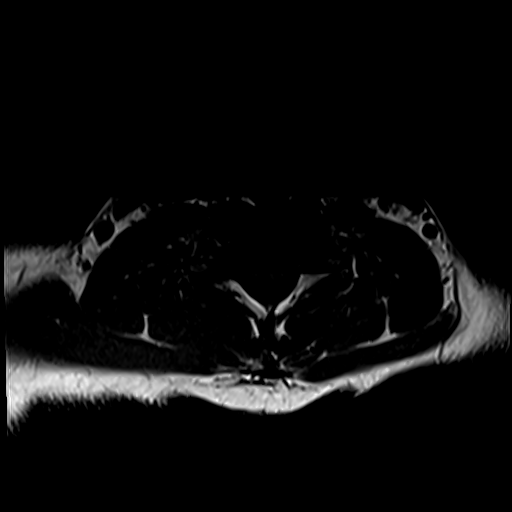
[im 15/30]
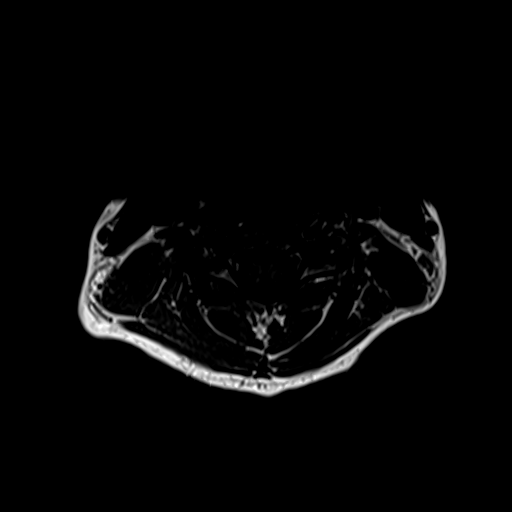
[im 22/30]
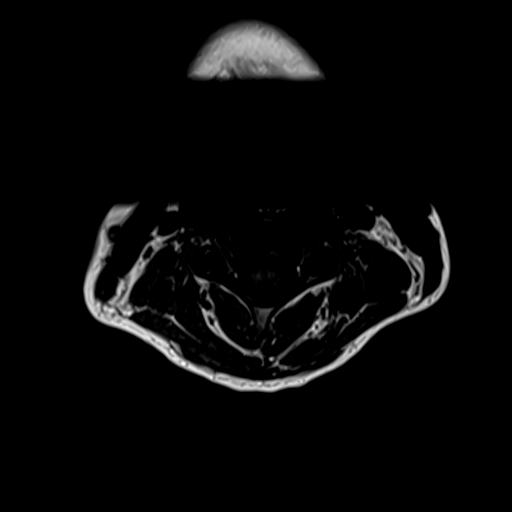
[im 30/30]
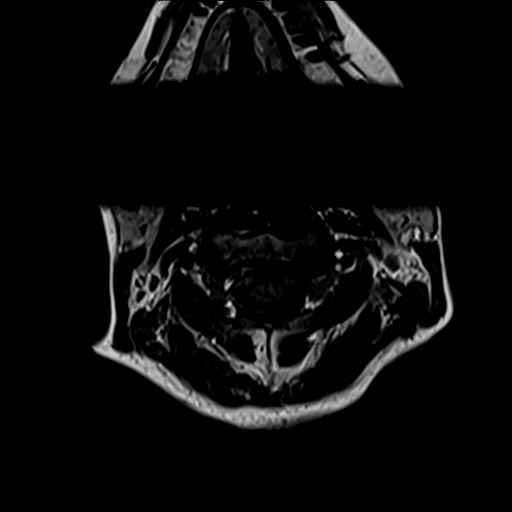

[Series 10: T2 post-contrast · sagittal · 3.0mm · 0.69mm/px · 2 of 14 slices shown]
[im 1/14]
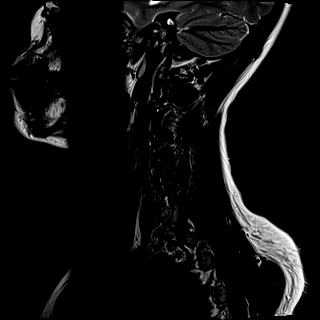
[im 14/14]
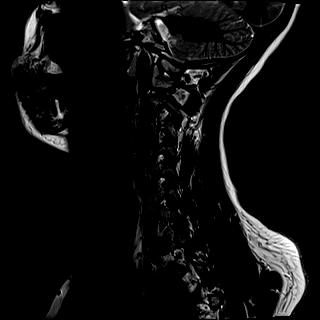

[Series 11: T1 fat-sat post-contrast · sagittal · 3.0mm · 0.69mm/px · 2 of 14 slices shown]
[im 1/14]
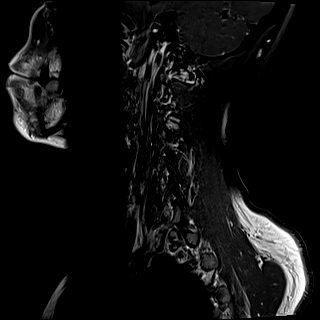
[im 14/14]
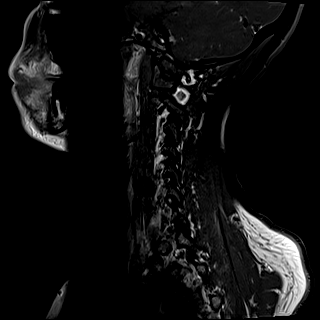

[Series 12: T1 post-contrast · axial · 3.0mm · 0.35mm/px · z∈[-174,-73]mm · 5 of 30 slices shown]
[im 1/30]
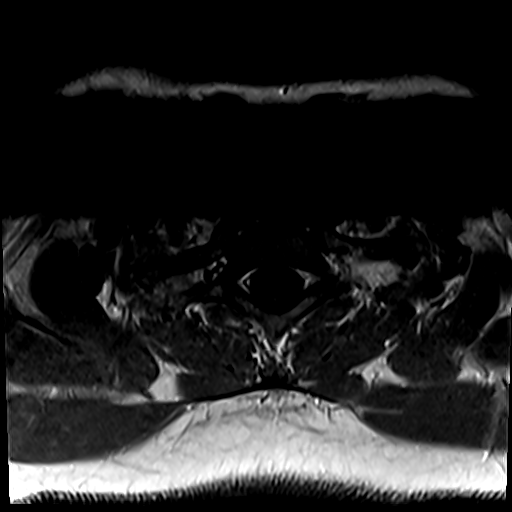
[im 8/30]
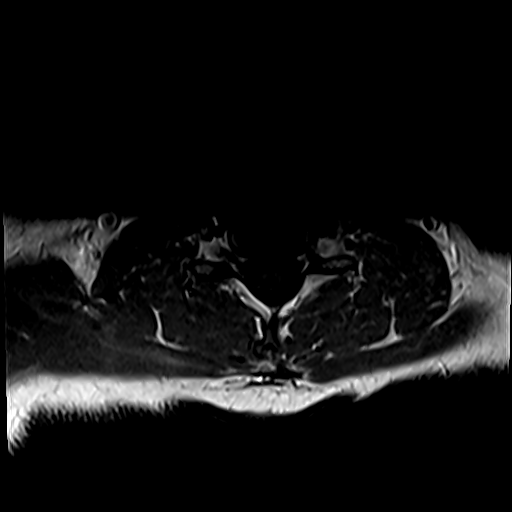
[im 15/30]
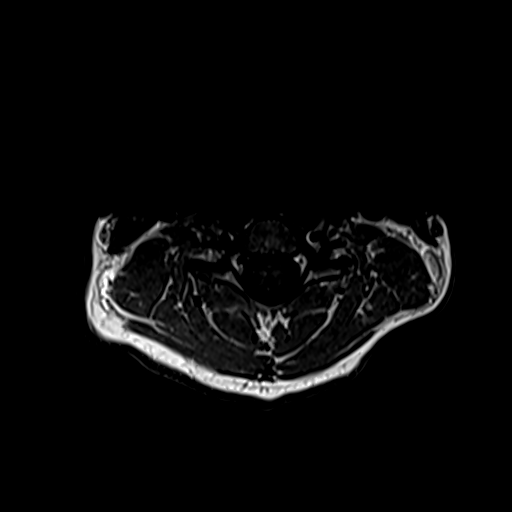
[im 22/30]
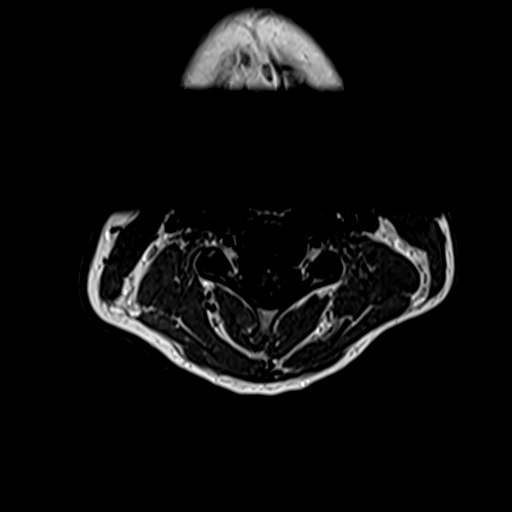
[im 30/30]
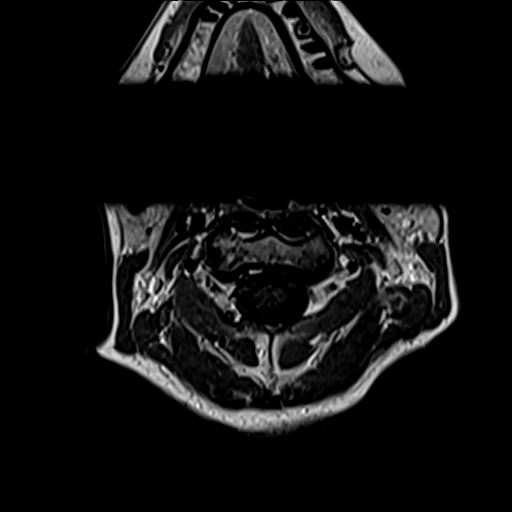

[28 of 48 positions shown; findings below may reference images not displayed]

FINDINGS: Alignment: Alignment is anatomic.

Vertebrae: Marrow signal and vertebral body heights are normal.

Cord: Normal signal and morphology.

Posterior Fossa, vertebral arteries, paraspinal tissues:
Craniocervical junction is normal. Flow is present in the vertebral
arteries bilaterally. Visualized intracranial contents are normal.

Disc levels:

C2-3: Negative.

C3-4: Mild uncovertebral spurring is present without significant
stenosis.

C4-5: Negative.

C5-6: Mild uncovertebral spurring and a broad-based disc osteophyte
complex partially effaces the ventral CSF. Mild foraminal narrowing
is present bilaterally.

C6-7: Negative.

C7-T1: Negative.

A mild broad-based disc protrusion partially effaces the ventral CSF
at T2-3. Foramina are patent at this level.
IMPRESSION: 1. Mild degenerative changes as described.
2. Uncovertebral spurring is evident at C3-4 at C5-6.
3. Mild central and bilateral foraminal narrowing at C5-6.
4. Broad-based disc protrusion at T2-3 without significant stenosis.

## 2020-04-15 IMAGING — DX DG CHEST 1V PORT
1 series · 1 of 1 positions shown · non-contrast
Comparison: Chest CTA [DATE] and earlier.

CLINICAL DATA: 34-year-old female with acute onset left arm
numbness and weakness at [37] hours, chest pain.

EXAM:
PORTABLE CHEST 1 VIEW

[chest ap]
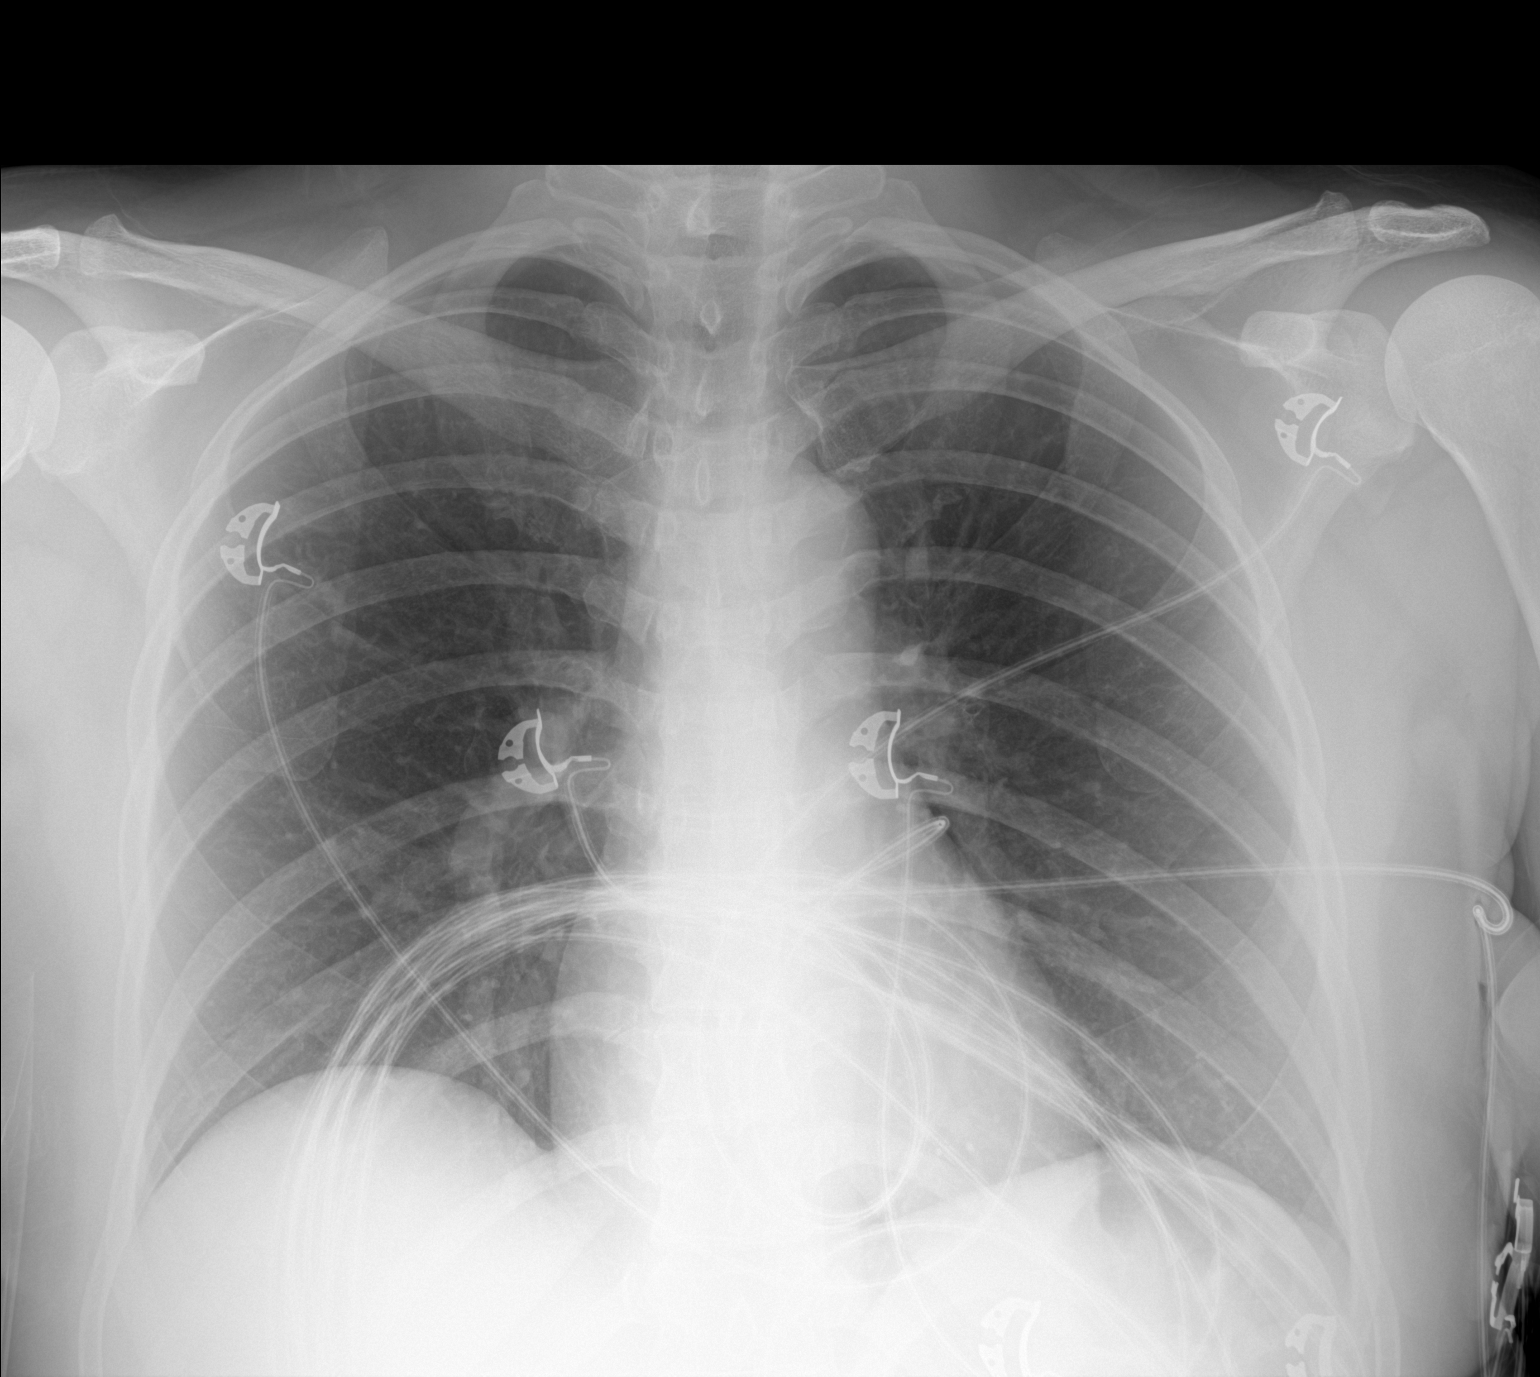

[1 of 1 positions shown; findings below may reference images not displayed]

FINDINGS: Portable AP semi upright view at at [37] hours. Low normal lung
volumes. Normal cardiac size and mediastinal contours. Visualized
tracheal air column is within normal limits. Allowing for portable
technique the lungs are clear. No pneumothorax. No osseous
abnormality identified.
IMPRESSION: Negative portable chest.

## 2020-04-15 MED ORDER — PREDNISONE 10 MG (21) PO TBPK: ORAL_TABLET | ORAL | 0 refills | Status: AC

## 2020-04-15 MED ORDER — GADOBUTROL 1 MMOL/ML IV SOLN
7.0000 mL | Freq: Once | INTRAVENOUS | Status: AC | PRN
  Administered 2020-04-15: 7 mL via INTRAVENOUS

## 2020-04-15 NOTE — Discharge Instructions (Addendum)
Follow up with the neurologist for a test called an EMG. Return to the ED if you develop new weakness, numbness, tingling, difficulty speaking, difficulty swallowing, chest pain, shortness of breath or any other concerns.

## 2020-04-15 NOTE — ED Triage Notes (Signed)
Pt states left arm weakness/ numbness. Began 0400.

## 2020-04-15 NOTE — ED Provider Notes (Signed)
[facility]-EMERGENCY DEPT Provider Note   CSN: [id number] Arrival date & time: 04/15/20  0459     History Chief Complaint  Patient presents with  . Extremity Weakness    [name] [name] is a 34 y.o. female.  Patient with history of ITP here with left-sided arm weakness and numbness and pain since about 4 AM.  States she was awake had sudden onset of "annoying feeling in my left arm".  She says it feels different than the other side and it is weak and painful.  She denies having this in the past.  Denies any weakness in her leg.  Denies any headache.  Her speech appears mildly slurred but she says this is because she is sleepy.  She denies any head or neck pain.  No chest pain.  While she was at home she had a brief episode of left-sided pain that was a sharp stabbing through to her back and lasted for a few seconds and has since resolved.  She denies any abdominal pain.  The history is provided by the patient.  Extremity Weakness Associated symptoms include chest pain. Pertinent negatives include no abdominal pain, no headaches and no shortness of breath.       Past Medical History:  Diagnosis Date  . Dental abscess   . Dental caries   . ITP (idiopathic thrombocytopenic purpura)   . ITP (idiopathic thrombocytopenic purpura)   . NVD (normal vaginal delivery)     Patient Active Problem List   Diagnosis Date Noted  . Iron deficiency anemia, unspecified 10/10/2013  . Cystitis with hematuria 07/16/2013  . Migraines 06/05/2013  . Obesity 05/15/2013  . ITP (idiopathic thrombocytopenic purpura) 09/21/2011  . TOBACCO USER 09/10/2009    Past Surgical History:  Procedure Laterality Date  . surgical repair to wrist    . TUBAL LIGATION Bilateral 10/31/2013   Procedure: POST PARTUM TUBAL LIGATION;  Surgeon: Willodean Rosenthal, MD;  Location: [facility];  Service: Gynecology;  Laterality: Bilateral;  filshie clips used     OB History    Gravida  11   Para   7   Term  7   Preterm  0   AB  4   Living  7     SAB  1   TAB  2   Ectopic  1   Multiple      Live Births  7           Family History  Problem Relation Age of Onset  . Cancer Father        leukemia  . Fibroids Mother   . Thrombocytopenia Brother        ITP    Social History   Tobacco Use  . Smoking status: Former Smoker    Packs/day: 0.50    Years: 10.00    Pack years: 5.00    Types: Cigarettes    Quit date: 04/12/2013    Years since quitting: 7.0  . Smokeless tobacco: Never Used  Substance Use Topics  . Alcohol use: No  . Drug use: No    Home Medications Prior to Admission medications   Medication Sig Start Date End Date Taking? Authorizing Provider  azithromycin (ZITHROMAX) 250 MG tablet Take 1 tablet (250 mg total) by mouth daily. Take first 2 tablets together, then 1 every day until finished. 11/06/15   Melene Plan, DO  Diphenhydramine-PE-APAP [facility]) 12.5-5-325 MG/15ML LIQD Take 1 Dose by mouth daily as needed (cold symptoms).  [provider]  gabapentin (NEURONTIN) 300 MG capsule Take 300 mg by mouth 3 (three) times daily. 09/10/15   [provider]  ibuprofen (ADVIL,MOTRIN) 800 MG tablet Take 800 mg by mouth every 8 (eight) hours as needed for headache or moderate pain.    [provider]  meloxicam (MOBIC) 15 MG tablet Take 1 tablet (15 mg total) by mouth daily. Take 1 daily with food. 05/30/18   Arthor CaptainHarris, Abigail, PA-C  Pseudoephedrine-APAP-DM [phone] MG/15ML LIQD Take 1 Dose by mouth daily as needed (cold symptoms).    [provider]  RaNITidine HCl (ZANTAC PO) Take 1 tablet by mouth daily as needed (stomach acid).    [provider]    Allergies    Patient has no known allergies.  Review of Systems   Review of Systems  Constitutional: Negative for activity change, appetite change and fever.  HENT: Negative for congestion and rhinorrhea.   Eyes: Negative for visual disturbance.   Respiratory: Negative for cough, chest tightness and shortness of breath.   Cardiovascular: Positive for chest pain.  Gastrointestinal: Negative for abdominal pain, nausea and vomiting.  Genitourinary: Negative for dysuria and hematuria.  Musculoskeletal: Positive for extremity weakness. Negative for arthralgias and myalgias.  Skin: Negative for rash.  Neurological: Positive for speech difficulty, weakness and numbness. Negative for dizziness, seizures, facial asymmetry and headaches.    all other systems are negative except as noted in the HPI and PMH.   Physical Exam Updated Vital Signs BP (!) 143/90   Pulse (!) 116   Temp 98.7 F (37.1 C) (Oral)   Resp (!) 23   Ht 5\' 7"  (1.702 m)   Wt 71 kg   SpO2 100%   BMI 24.53 kg/m   Physical Exam Vitals and nursing note reviewed.  Constitutional:      General: She is not in acute distress.    Appearance: She is well-developed.     Comments: Anxious appearing  HENT:     Head: Normocephalic and atraumatic.     Mouth/Throat:     Pharynx: No oropharyngeal exudate.  Eyes:     Conjunctiva/sclera: Conjunctivae normal.     Pupils: Pupils are equal, round, and reactive to light.  Neck:     Comments: No meningismus. Cardiovascular:     Rate and Rhythm: Regular rhythm. Tachycardia present.     Heart sounds: Normal heart sounds. No murmur.  Pulmonary:     Effort: Pulmonary effort is normal. No respiratory distress.     Breath sounds: Normal breath sounds.  Abdominal:     Palpations: Abdomen is soft.     Tenderness: There is no abdominal tenderness. There is no guarding or rebound.  Musculoskeletal:        General: No tenderness. Normal range of motion.     Cervical back: Normal range of motion and neck supple.  Skin:    General: Skin is warm.  Neurological:     Mental Status: She is alert and oriented to person, place, and time.     Cranial Nerves: No cranial nerve deficit.     Motor: No abnormal muscle tone.     Coordination:  Coordination normal.     Comments: Difficulty holding left arm up against gravity.  Decreased grip strength on the left and decreased forearm flexion extension on the left.  Cranial nerves are 2-12 intact with no facial asymmetry.  Tongue is midline.  5/5 strength in lower extremities.   Psychiatric:  Behavior: Behavior normal.     ED Results / Procedures / Treatments   Labs (all labs ordered are listed, but only abnormal results are displayed) Labs Reviewed  CBC - Abnormal; Notable for the following components:      Result Value   Platelets 98 (*)    All other components within normal limits  COMPREHENSIVE METABOLIC PANEL - Abnormal; Notable for the following components:   Potassium 3.3 (*)    Glucose, Bld 102 (*)    All other components within normal limits  URINALYSIS, ROUTINE W REFLEX MICROSCOPIC - Abnormal; Notable for the following components:   Color, Urine STRAW (*)    Specific Gravity, Urine 1.004 (*)    Hgb urine dipstick SMALL (*)    Leukocytes,Ua TRACE (*)    Bacteria, UA RARE (*)    All other components within normal limits  I-STAT CHEM 8, ED - Abnormal; Notable for the following components:   Potassium 3.2 (*)    All other components within normal limits  RESPIRATORY PANEL BY RT PCR (FLU A&B, COVID)  ETHANOL  PROTIME-INR  APTT  DIFFERENTIAL  RAPID URINE DRUG SCREEN, HOSP PERFORMED  I-STAT BETA HCG BLOOD, ED ([facility], [facility], [facility] ONLY)  TROPONIN I (HIGH SENSITIVITY)    EKG EKG Interpretation  Date/Time:  Monday April 15 2020 05:49:41 EDT Ventricular Rate:  120 PR Interval:    QRS Duration: 95 QT Interval:  333 QTC Calculation: 471 R Axis:   80 Text Interpretation: Sinus tachycardia Borderline repolarization abnormality No significant change was found Confirmed by Glynn Octave [phone]) on 04/15/2020 5:59:41 AM   Radiology CT CERVICAL SPINE WO CONTRAST  Result Date: 04/15/2020 CLINICAL DATA:  Code stroke. 34 year old female with left arm weakness and  numbness since 0400 hours. EXAM: CT CERVICAL SPINE WITHOUT CONTRAST TECHNIQUE: Multidetector CT imaging of the cervical spine was performed without intravenous contrast. Multiplanar CT image reconstructions were also generated. COMPARISON:  Head CT today. Face CT 08/22/2012. FINDINGS: Alignment: Relatively preserved cervical lordosis. Cervicothoracic junction alignment is within normal limits. Bilateral posterior element alignment is within normal limits. Mild dextroconvex cervical spine curvature. Skull base and vertebrae: Visualized skull base is intact. No atlanto-occipital dissociation. No acute osseous abnormality identified. Soft tissues and spinal canal: No prevertebral fluid or swelling. No visible canal hematoma. Negative noncontrast neck soft tissues. Disc levels: No spinal stenosis or significant degenerative changes identified. Upper chest: Negative. IMPRESSION: No acute osseous abnormality identified and negative CT appearance of the cervical spine. Study discussed by telephone with Dr. Glynn Octave on 04/15/2020 at 05:56 . Electronically Signed   By: Odessa Fleming M.D.   On: 04/15/2020 06:00   DG Chest Portable 1 View  Result Date: 04/15/2020 CLINICAL DATA:  34 year old female with acute onset left arm numbness and weakness at 0400 hours, chest pain. EXAM: PORTABLE CHEST 1 VIEW COMPARISON:  Chest CTA 11/06/2015 and earlier. FINDINGS: Portable AP semi upright view at at 0545 hours. Low normal lung volumes. Normal cardiac size and mediastinal contours. Visualized tracheal air column is within normal limits. Allowing for portable technique the lungs are clear. No pneumothorax. No osseous abnormality identified. IMPRESSION: Negative portable chest. Electronically Signed   By: Odessa Fleming M.D.   On: 04/15/2020 06:01   CT HEAD CODE STROKE WO CONTRAST  Addendum Date: 04/15/2020   ADDENDUM REPORT: 04/15/2020 06:00 ADDENDUM: Study discussed by telephone with Dr. Glynn Octave on 04/15/2020 at 05:56 .  Electronically Signed   By: Odessa Fleming M.D.   On: 04/15/2020 06:00  Result Date: 04/15/2020 CLINICAL DATA:  Code stroke. 34 year old female with left arm weakness and numbness since 0400 hours. EXAM: CT HEAD WITHOUT CONTRAST TECHNIQUE: Contiguous axial images were obtained from the base of the skull through the vertex without intravenous contrast. COMPARISON:  Face CT 08/22/2012. FINDINGS: Brain: Normal cerebral volume. No midline shift, ventriculomegaly, mass effect, evidence of mass lesion, intracranial hemorrhage or evidence of cortically based acute infarction. Gray-white matter differentiation is within normal limits throughout the brain. Partially empty sella, similar to the 2013 CT. Vascular: Mild Calcified atherosclerosis at the skull base. No suspicious intracranial vascular hyperdensity. Skull: Negative. Sinuses/Orbits: Visualized paranasal sinuses and mastoids are stable and well pneumatized. Other: Visualized orbits and scalp soft tissues are within normal limits. ASPECTS [facility] Stroke Program Early CT Score) Total score (0-10 with 10 being normal): 10 IMPRESSION: 1. No acute intracranial hemorrhage or cortically based infarct identified. ASPECTS 10. 2. Normal noncontrast CT appearance of the brain aside from chronic partially empty sella, often a normal anatomic variant but can be associated with idiopathic intracranial hypertension (pseudotumor cerebri) Electronically Signed: By: Odessa Fleming M.D. On: 04/15/2020 05:54    Procedures .Critical Care Performed by: Glynn Octave, MD Authorized by: Glynn Octave, MD   Critical care provider statement:    Critical care time (minutes):  35   Critical care was necessary to treat or prevent imminent or life-threatening deterioration of the following conditions:  CNS failure or compromise   Critical care was time spent personally by me on the following activities:  Discussions with consultants, evaluation of patient's response to treatment,  examination of patient, ordering and performing treatments and interventions, ordering and review of laboratory studies, ordering and review of radiographic studies, pulse oximetry, re-evaluation of patient's condition, obtaining history from patient or surrogate and review of old charts   (including critical care time)  Medications Ordered in ED Medications - No data to display  ED Course  I have reviewed the triage vital signs and the nursing notes.  Pertinent labs & imaging results that were available during my care of the patient were reviewed by me and considered in my medical decision making (see chart for details).    MDM Rules/Calculators/A&P                     Left arm numbness and weakness for about the past hour, last normal at 4 AM.  She is tachycardic and anxious.  Had a brief episode of chest pain at home but none currently.  Code stroke activated.  CT head and C-spine are negative for hemorrhage or large area of infarct.  Patient is tachycardic and anxious appearing.  Did have a brief episode of chest pain earlier which is since resolved.  She was seen by neurology Dr. Imogene Burn.  He feels this is unlikely to be stroke given her pain and weakness but did offer TPA which patient declines given her history of thrombocytopenia. Dr. Imogene Burn does not feel like she needs CT angiogram of her head and neck.  He recommends MRI of brain and C-spine.  Patient symptoms have improved.  Her tachycardia and hypotension have resolved.  She did have a brief episode of sharp stabbing left-sided chest pain earlier lasting for just a second or two that she has had intermittently. in the past.  His description of pain is atypical for ACS, pulmonary embolism, aortic dissection.  Screening troponin will be sent.  Chest x-ray is normal.  MRI of brain and C-spine pending at  shift change.  Patient referred to neurology for further outpatient work-up including EMG. Final Clinical Impression(s) / ED  Diagnoses Final diagnoses:  LUE numbness    Rx / DC Orders ED Discharge Orders    None       Cohen Doleman, Annie Main, MD 04/15/20 0730

## 2020-04-15 NOTE — ED Provider Notes (Signed)
Pt signed out by Dr. Manus Gunning pending MRI.  Pt does have some osteophytes and some disc bulging.  Pt will be d/c home with prednisone.  Pt also asked for a referral to heme-onc about her platelets.  Pt has insurance and is told to find out which pcp is covered by her plan and to establish with a pcp.   Jacalyn Lefevre, MD 04/15/20 1052

## 2020-04-15 NOTE — Consult Note (Signed)
[facility] Tele[facility] Consult Services   Date of Service:   04/15/2020 05:48:51  Impression:     .  R20.2 - Paresthesia of skin  Comments/Sign-Out: The patient is a 34 year old woman with a history of ITP, who presents with L arm pain and numbness. Somewhat nonspecific presentation. CVA is in differential as well as inflammatory disease. alteplase discussed, patient feels risk outweighs benefit given history of ITP and nondisabling symptom, and platelets of 98 and would ruled out alteplase in any case. Reasonable to evaluate for central process with MRI brain and C-spine without contrast. If positive for infarct, advise further evaluation of cerebrovascular risk factors. If MRIs negative for explanatory etiology, plan outpatient EMG.  Metrics: Last Known Well: 04/15/2020 04:00:05 [facility] Notification Time: 04/15/2020 05:48:51 Arrival Time: 04/15/2020 04:59:00 Stamp Time: 04/15/2020 05:48:51 Time First Login Attempt: 04/15/2020 05:57:19 Symptoms: left sided weakness NIHSS Start Assessment Time: 04/15/2020 05:59:00 Patient is not a candidate for Alteplase/Activase. Alteplase Medical Decision: 04/15/2020 06:16:55 Patient was not deemed candidate for Alteplase/Activase thrombolytics because of patient defer.  CT head was reviewed and results were: IMPRESSION BRAIN: 1. No acute intracranial hemorrhage or cortically based infarct identified. ASPECTS 10. 2. Normal noncontrast CT appearance of the brain aside from chronic partially empty sella, often a normal anatomic variant but can be associated with idiopathic intracranial hypertension (pseudotumor cerebri) IMPRESSION CT C-SPINE No acute osseous abnormality identified and negative CT appearance of the cervical spine.  Clinical Presentation is not Suggestive of Large Vessel Occlusive Disease  ED Physician notified of diagnostic impression and management plan on 04/15/2020 06:22:45  Alteplase/Activase  Contraindications:  Last Known Well > 4.5 hours: No CT Head showing hemorrhage: No Ischemic stroke within 3 months: No Severe head trauma within 3 months: No Intracranial/intraspinal surgery within 3 months: No History of intracranial hemorrhage: No Symptoms and signs consistent with an SAH: No GI malignancy or GI bleed within 21 days: No Coagulopathy: Platelets <100 000/mm3, INR >1.7, aPTT>40 s, or PT >15 s: No Treatment dose of LMWH within the previous 24 hrs: No Use of NOACs in past 48 hours: No Glycoprotein IIb/IIIa receptor inhibitors use: No Symptoms consistent with infective endocarditis: No Suspected aortic arch dissection: No Intra-axial intracranial neoplasm: No  Sign Out:     .  Discussed with Emergency Department Provider    ------------------------------------------------------------------------------  History of Present Illness: Patient is a 34 year old Female.  Patient was brought by private transportation with symptoms of left sided weakness  Was in normal state of health awake at 4 AM when she developed left arm pain and numbness. Reports sharp pain in back shooting down arm. Since then reports uncomfortable, difficult to describe sensory disturbance in left arm sparing face and leg. On exam, initially there was mild drift in left arm but resolved following CT. Also reports sense that light touch on left side of face does not feel same as right. Reports prior episode of left arm pain /sensory disturbance lasting few minutes several months ago. No headache. Also repots left leg swelling and discoloration in AM for past few months. Past medical history includes ITP. Reports blood in stool in Dec.   Anticoagulant use:  No  Antiplatelet use: No    Examination: BP(143/90), Pulse(103), Blood Glucose(97) 1A: Level of Consciousness - Alert; keenly responsive + 0 1B: Ask Month and Age - Both Questions Right + 0 1C: Blink Eyes & Squeeze Hands - Performs Both Tasks +  0 2: Test Horizontal Extraocular Movements - Normal +  0 3: Test Visual Fields - No Visual Loss + 0 4: Test Facial Palsy (Use Grimace if Obtunded) - Normal symmetry + 0 5A: Test Left Arm Motor Drift - No Drift for 10 Seconds + 0 5B: Test Right Arm Motor Drift - No Drift for 10 Seconds + 0 6A: Test Left Leg Motor Drift - No Drift for 5 Seconds + 0 6B: Test Right Leg Motor Drift - No Drift for 5 Seconds + 0 7: Test Limb Ataxia (FNF/Heel-Shin) - No Ataxia + 0 8: Test Sensation - Mild-Moderate Loss: Less Sharp/More Dull + 1 9: Test Language/Aphasia - Normal; No aphasia + 0 10: Test Dysarthria - Normal + 0 11: Test Extinction/Inattention - No abnormality + 0  NIHSS Score: 1  Pre-Morbid Modified Ranking Scale: 0 Points = No symptoms at all   Patient/Family was informed the Neurology Consult would occur via TeleHealth consult by way of interactive audio and video telecommunications and consented to receiving care in this manner.   Due to the immediate potential for life-threatening deterioration due to underlying acute neurologic illness, I spent 30 minutes providing critical care. This time includes time for face to face visit via telemedicine, review of medical records, imaging studies and discussion of findings with providers, the patient and/or family.   Dr Rosanne Ashing   [facility] [phone]  Case [id number]

## 2020-05-01 ENCOUNTER — Encounter: Payer: Self-pay | Admitting: Hematology and Oncology

## 2023-01-04 ENCOUNTER — Ambulatory Visit (INDEPENDENT_AMBULATORY_CARE_PROVIDER_SITE_OTHER): Payer: BC Managed Care – PPO | Admitting: Podiatry

## 2023-01-04 ENCOUNTER — Ambulatory Visit (INDEPENDENT_AMBULATORY_CARE_PROVIDER_SITE_OTHER): Payer: BC Managed Care – PPO

## 2023-01-04 VITALS — BP 144/95 | HR 87

## 2023-01-04 DIAGNOSIS — M21611 Bunion of right foot: Secondary | ICD-10-CM

## 2023-01-04 DIAGNOSIS — M21612 Bunion of left foot: Secondary | ICD-10-CM

## 2023-01-04 DIAGNOSIS — Z7689 Persons encountering health services in other specified circumstances: Secondary | ICD-10-CM

## 2023-01-04 DIAGNOSIS — D693 Immune thrombocytopenic purpura: Secondary | ICD-10-CM | POA: Diagnosis not present

## 2023-01-04 MED ORDER — DICLOFENAC SODIUM 1 % EX GEL: 2.0000 g | Freq: Four times a day (QID) | CUTANEOUS | 2 refills | Status: AC

## 2023-01-04 NOTE — Progress Notes (Unsigned)
Subjective:   Patient ID: [name] [name], female   DOB: 37 y.o.   MRN: [id number]   HPI Chief Complaint  Patient presents with   Bunions    Patient states she's had painful bunions for years that she would like a doctor to check out.     She has had the bunions since she was a teenager but it has been getting more painful. She has tried changing shoes. The right bothers her more than the left.   She works at [employer]  On prednisone for thrombocytopenis.   No tobacoo use No etoh.     ROS      Objective:  Physical Exam  ***     Assessment:  ***     Plan:  ***    -get quote for [place]  -refer to cancer [facility]ner

## 2023-01-26 ENCOUNTER — Telehealth: Payer: Self-pay | Admitting: Hematology and Oncology

## 2023-01-26 NOTE — Telephone Encounter (Signed)
Scheduled appt per 1/9 referral. Pt requested to see Dr. Lorenso Courier. Pt is aware of appt date and time. Pt is aware to arrive 15 mins prior to appt time and to bring and updated insurance card. Pt is aware of appt location.

## 2023-02-11 ENCOUNTER — Inpatient Hospital Stay: Payer: BC Managed Care – PPO | Attending: Hematology and Oncology | Admitting: Hematology and Oncology

## 2023-02-11 ENCOUNTER — Other Ambulatory Visit: Payer: Self-pay

## 2023-02-11 ENCOUNTER — Inpatient Hospital Stay: Payer: BC Managed Care – PPO

## 2023-02-11 VITALS — BP 134/90 | HR 100 | Temp 98.4°F | Resp 16 | Wt 213.7 lb

## 2023-02-11 DIAGNOSIS — R231 Pallor: Secondary | ICD-10-CM | POA: Insufficient documentation

## 2023-02-11 DIAGNOSIS — Z79899 Other long term (current) drug therapy: Secondary | ICD-10-CM | POA: Diagnosis not present

## 2023-02-11 DIAGNOSIS — D693 Immune thrombocytopenic purpura: Secondary | ICD-10-CM | POA: Diagnosis not present

## 2023-02-11 DIAGNOSIS — D696 Thrombocytopenia, unspecified: Secondary | ICD-10-CM

## 2023-02-11 LAB — CBC WITH DIFFERENTIAL (CANCER CENTER ONLY)
Abs Immature Granulocytes: 0.01 10*3/uL (ref 0.00–0.07)
Basophils Absolute: 0 10*3/uL (ref 0.0–0.1)
Basophils Relative: 1 %
Eosinophils Absolute: 0.1 10*3/uL (ref 0.0–0.5)
Eosinophils Relative: 1 %
HCT: 40.9 % (ref 36.0–46.0)
Hemoglobin: 14.2 g/dL (ref 12.0–15.0)
Immature Granulocytes: 0 %
Lymphocytes Relative: 48 %
Lymphs Abs: 1.7 10*3/uL (ref 0.7–4.0)
MCH: 29.6 pg (ref 26.0–34.0)
MCHC: 34.7 g/dL (ref 30.0–36.0)
MCV: 85.2 fL (ref 80.0–100.0)
Monocytes Absolute: 0.3 10*3/uL (ref 0.1–1.0)
Monocytes Relative: 9 %
Neutro Abs: 1.5 10*3/uL — ABNORMAL LOW (ref 1.7–7.7)
Neutrophils Relative %: 41 %
Platelet Count: 108 10*3/uL — ABNORMAL LOW (ref 150–400)
RBC: 4.8 MIL/uL (ref 3.87–5.11)
RDW: 14.3 % (ref 11.5–15.5)
WBC Count: 3.6 10*3/uL — ABNORMAL LOW (ref 4.0–10.5)
nRBC: 0 % (ref 0.0–0.2)

## 2023-02-11 LAB — CMP (CANCER CENTER ONLY)
ALT: 32 U/L (ref 0–44)
AST: 27 U/L (ref 15–41)
Albumin: 3.9 g/dL (ref 3.5–5.0)
Alkaline Phosphatase: 59 U/L (ref 38–126)
Anion gap: 9 (ref 5–15)
BUN: 16 mg/dL (ref 6–20)
CO2: 25 mmol/L (ref 22–32)
Calcium: 8.8 mg/dL — ABNORMAL LOW (ref 8.9–10.3)
Chloride: 104 mmol/L (ref 98–111)
Creatinine: 0.68 mg/dL (ref 0.44–1.00)
GFR, Estimated: >60 mL/min (ref >60)
Glucose, Bld: 90 mg/dL (ref 70–99)
Potassium: 3.5 mmol/L (ref 3.5–5.1)
Sodium: 138 mmol/L (ref 135–145)
Total Bilirubin: 0.2 mg/dL — ABNORMAL LOW (ref 0.3–1.2)
Total Protein: 7 g/dL (ref 6.5–8.1)

## 2023-02-11 LAB — HEPATITIS B SURFACE ANTIGEN: Hepatitis B Surface Ag: NONREACTIVE

## 2023-02-11 LAB — FOLATE: Folate: 15.6 ng/mL (ref >5.9)

## 2023-02-11 LAB — HIV ANTIBODY (ROUTINE TESTING W REFLEX): HIV Screen 4th Generation wRfx: NONREACTIVE

## 2023-02-11 LAB — HEPATITIS B CORE ANTIBODY, TOTAL: Hep B Core Total Ab: NONREACTIVE

## 2023-02-11 LAB — SAVE SMEAR(SSMR), FOR PROVIDER SLIDE REVIEW

## 2023-02-11 LAB — C-REACTIVE PROTEIN: CRP: 1.1 mg/dL — ABNORMAL HIGH (ref <1.0)

## 2023-02-11 LAB — IMMATURE PLATELET FRACTION: Immature Platelet Fraction: 19.5 % — ABNORMAL HIGH (ref 1.2–8.6)

## 2023-02-11 LAB — HEPATITIS B SURFACE ANTIBODY,QUALITATIVE: Hep B S Ab: NONREACTIVE

## 2023-02-11 LAB — HEPATITIS C ANTIBODY: HCV Ab: NONREACTIVE

## 2023-02-11 LAB — SEDIMENTATION RATE: Sed Rate: 35 mm/hr — ABNORMAL HIGH (ref 0–22)

## 2023-02-11 LAB — VITAMIN B12: Vitamin B-12: 789 pg/mL (ref 180–914)

## 2023-02-11 NOTE — Progress Notes (Signed)
[facility] [phone]   [phone]  INITIAL CONSULT NOTE  Patient Care Team: Patient, No Pcp Per as PCP - General (General Practice)  Hematological/Oncological History # Idiopathic Thrombocytopenic Purpura 10/10/2013: prior visit with Dr. Curt Bears. Was undergoing steroid treatment for thrombocytopenia in pregnancy. Lost to follow up 04/15/2020: WBC 8.3, Hgb 13.4, MCV 89.8, Plt 98 02/11/2023: establish care with Dr. Lorenso Courier   CHIEF COMPLAINTS/PURPOSE OF CONSULTATION:  "Idiopathic Thrombocytopenic Purpura "  HISTORY OF PRESENTING ILLNESS:  [name] [name] 37 y.o. female with medical history significant for ITP who presents to establish care.   On review of the previous records [name] [name] has had a longstanding diagnosis of ITP.  She was previously seen in the cancer center by Dr. Lorna Few.  Her last visit with him was on 10/10/2013.  She was diagnosed with ITP during her prior pregnancy.  Most recent labs in our records are from 04/15/2020 which show white blood cell count 8.3, hemoglobin 13.4, MCV 89.8, and platelets 98.  Due to concern for this patient's ITP she was reconnected with hematology today.  On exam today [name] [name] reports that she is "feeling okay".  She reports that she has fatigue and always feels tired.  She notes that she is struggling with joint issues on an almost daily basis.  She also has migraines and reports that "my teeth have fallen out".  She reports that her joints feel stiff all the time.  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  She reports that she has taken steroids before in the past and when she does she feels better and the rash disappears.  She reports that she has fallen out of medical care because she has not had health insurance but recently reconnected with expanded Medicaid with [employer] and [employer].  She reports that the livedo reticularis is mostly on her  arms and legs but she has seen before on her chest.  She reports that it is severely diminishing her quality of life.  On further discussion she reports that her mother is not on speaking terms with her but did complain of joint pains.  Her father died of an unclear cancer.  She her maternal grandfather had a heart attack.  She reports that there are cardiac disease on her mother side of the family and cancers on her father side.  She has 7 children 3 of whom are grown.  She is a never smoker never drinker and currently works as an Engineer, structural at [employer].  She reports she is not having any fevers, chills, sweats but is prone to bruising and occasional nosebleeds.  She does not have any overt bleeding in the urine or stool.  She otherwise denies any weight loss or major changes in her health.  A full 10 point ROS was otherwise negative.  MEDICAL HISTORY:  Past Medical History:  Diagnosis Date   Dental abscess    Dental caries    ITP (idiopathic thrombocytopenic purpura)    ITP (idiopathic thrombocytopenic purpura)    NVD (normal vaginal delivery)     SURGICAL HISTORY: Past Surgical History:  Procedure Laterality Date   surgical repair to wrist     TUBAL LIGATION Bilateral 10/31/2013   Procedure: POST PARTUM TUBAL LIGATION;  Surgeon: Lavonia Drafts, MD;  Location: [facility];  Service: Gynecology;  Laterality: Bilateral;  filshie clips used    SOCIAL HISTORY: Social History   Socioeconomic History  Marital status: Married    Spouse name: Not on file   Number of children: Not on file   Years of education: Not on file   Highest education level: Not on file  Occupational History   Not on file  Tobacco Use   Smoking status: Former    Packs/day: 0.50    Years: 10.00    Total pack years: 5.00    Types: Cigarettes    Quit date: 04/12/2013    Years since quitting: 9.8   Smokeless tobacco: Never  Substance and Sexual Activity   Alcohol use: No   Drug use: No   Sexual  activity: Yes    Birth control/protection: Surgical  Other Topics Concern   Not on file  Social History Narrative   Not on file   Social Determinants of Health   Financial Resource Strain: Not on file  Food Insecurity: Not on file  Transportation Needs: Not on file  Physical Activity: Not on file  Stress: Not on file  Social Connections: Not on file  Intimate Partner Violence: Not on file    FAMILY HISTORY: Family History  Problem Relation Age of Onset   Cancer Father        leukemia   Fibroids Mother    Thrombocytopenia Brother        ITP    ALLERGIES:  has No Known Allergies.  MEDICATIONS:  Current Outpatient Medications  Medication Sig Dispense Refill   acetaminophen (TYLENOL) 500 MG tablet Take 2,000 mg by mouth every 6 (six) hours as needed for moderate pain.     azithromycin (ZITHROMAX) 250 MG tablet Take 1 tablet (250 mg total) by mouth daily. Take first 2 tablets together, then 1 every day until finished. (Patient not taking: Reported on 04/15/2020) 6 tablet 0   diclofenac Sodium (VOLTAREN) 1 % GEL Apply 2 g topically 4 (four) times daily. Rub into affected area of foot 2 to 4 times daily 100 g 2   predniSONE (STERAPRED UNI-PAK 21 TAB) 10 MG (21) TBPK tablet Take 6 tabs for 2 days, then 5 for 2 days, then 4 for 2 days, then 3 for 2 days, 2 for 2 days, then 1 for 2 days 42 tablet 0   No current facility-administered medications for this visit.    REVIEW OF SYSTEMS:   Constitutional: ( - ) fevers, ( - )  chills , ( - ) night sweats Eyes: ( - ) blurriness of vision, ( - ) double vision, ( - ) watery eyes Ears, nose, mouth, throat, and face: ( - ) mucositis, ( - ) sore throat Respiratory: ( - ) cough, ( - ) dyspnea, ( - ) wheezes Cardiovascular: ( - ) palpitation, ( - ) chest discomfort, ( - ) lower extremity swelling Gastrointestinal:  ( - ) nausea, ( - ) heartburn, ( - ) change in bowel habits Skin: ( - ) abnormal skin rashes Lymphatics: ( - ) new  lymphadenopathy, ( - ) easy bruising Neurological: ( - ) numbness, ( - ) tingling, ( - ) new weaknesses Behavioral/Psych: ( - ) mood change, ( - ) new changes  All other systems were reviewed with the patient and are negative.  PHYSICAL EXAMINATION:  Vitals:   02/11/23 0851  BP: (!) 134/90  Pulse: 100  Resp: 16  Temp: 98.4 F (36.9 C)  SpO2: 100%   Filed Weights   02/11/23 0851  Weight: 213 lb 11.2 oz (96.9 kg)    GENERAL: well appearing young  female in NAD  SKIN: Livedo reticularis on lower extremities bilaterally, otherwise skin color, texture, turgor are normal, no rashes or significant lesions EYES: conjunctiva are pink and non-injected, sclera clear LUNGS: clear to auscultation and percussion with normal breathing effort HEART: regular rate & rhythm and no murmurs and no lower extremity edema Musculoskeletal: no cyanosis of digits and no clubbing  PSYCH: alert & oriented x 3, fluent speech NEURO: no focal motor/sensory deficits  LABORATORY DATA:  I have reviewed the data as listed    Latest Ref Rng & Units 02/11/2023   10:14 AM 04/15/2020    5:53 AM 04/15/2020    5:45 AM  CBC  WBC 4.0 - 10.5 K/uL 3.6   8.3   Hemoglobin 12.0 - 15.0 g/dL 14.2  13.3  13.4   Hematocrit 36.0 - 46.0 % 40.9  39.0  40.6   Platelets 150 - 400 K/uL 108   98        Latest Ref Rng & Units 02/11/2023   10:14 AM 04/15/2020    5:53 AM 04/15/2020    5:45 AM  CMP  Glucose 70 - 99 mg/dL 90  97  102   BUN 6 - 20 mg/dL 16  11  12   $ Creatinine 0.44 - 1.00 mg/dL 0.68  0.80  0.82   Sodium 135 - 145 mmol/L 138  139  137   Potassium 3.5 - 5.1 mmol/L 3.5  3.2  3.3   Chloride 98 - 111 mmol/L 104  103  104   CO2 22 - 32 mmol/L 25   23   Calcium 8.9 - 10.3 mg/dL 8.8   9.0   Total Protein 6.5 - 8.1 g/dL 7.0   7.2   Total Bilirubin 0.3 - 1.2 mg/dL 0.2   0.3   Alkaline Phos 38 - 126 U/L 59   46   AST 15 - 41 U/L 27   19   ALT 0 - 44 U/L 32   18      ASSESSMENT & PLAN Mickeal Skinner 37 y.o. female  with medical history significant for ITP who presents to establish care.   After review of the labs, review of the records, and discussion with the patient the patients findings are most consistent with chronic mild ITP.  # Immune Thrombocytopenic Purpura, mild -- Patient has a baseline platelet count of approximately 100, findings most consistent with ITP. -- Would recommend pulse of dexamethasone 40 mg p.o. x 4 days if platelet count were to consistently drop below 30 -- Patient denies any bleeding, bruising, or dark stools.  No other cytopenias are noted on her blood work today. -- Recommend return to clinic in 6 months time for labs and 12 months for clinic visit.  #Joint Pain # Livedo Reticularis -- Findings are concerning for an underlying inflammatory/rheumatological condition. -- Will order ESR and CRP and make referral to rheumatology.  Orders Placed This Encounter  Procedures   CBC with Differential ([facility] Only)    Standing Status:   Future    Number of Occurrences:   1    Standing Expiration Date:   02/12/2024   CMP ([facility] only)    Standing Status:   Future    Number of Occurrences:   1    Standing Expiration Date:   02/12/2024   Immature Platelet Fraction    Standing Status:   Future    Number of Occurrences:   1    Standing Expiration Date:  02/12/2024   Vitamin B12    Standing Status:   Future    Number of Occurrences:   1    Standing Expiration Date:   02/11/2024   Methylmalonic acid, serum    Standing Status:   Future    Number of Occurrences:   1    Standing Expiration Date:   02/11/2024   Folate, Serum    Standing Status:   Future    Number of Occurrences:   1    Standing Expiration Date:   02/11/2024   Save Smear for Provider Slide Review    Standing Status:   Future    Number of Occurrences:   1    Standing Expiration Date:   02/12/2024   Hepatitis C antibody    Standing Status:   Future    Number of Occurrences:   1    Standing Expiration  Date:   02/11/2024   HIV antibody (with reflex)    Standing Status:   Future    Number of Occurrences:   1    Standing Expiration Date:   02/11/2024   Hepatitis B surface antigen    Standing Status:   Future    Number of Occurrences:   1    Standing Expiration Date:   02/11/2024   Hepatitis B surface antibody    Standing Status:   Future    Number of Occurrences:   1    Standing Expiration Date:   02/11/2024   Hepatitis B core antibody, total    Standing Status:   Future    Number of Occurrences:   1    Standing Expiration Date:   02/11/2024   Sedimentation rate    Standing Status:   Future    Number of Occurrences:   1    Standing Expiration Date:   02/11/2024   C-reactive protein    Standing Status:   Future    Number of Occurrences:   1    Standing Expiration Date:   02/11/2024   Ambulatory referral to Rheumatology    Referral Priority:   Routine    Referral Type:   Consultation    Referral Reason:   Specialty Services Required    Referred to Provider:   Collier Salina, MD    Requested Specialty:   Rheumatology    Number of Visits Requested:   1    All questions were answered. The patient knows to call the clinic with any problems, questions or concerns.  A total of more than 60 minutes were spent on this encounter with face-to-face time and non-face-to-face time, including preparing to see the patient, ordering tests and/or medications, counseling the patient and coordination of care as outlined above.   Ledell Peoples, MD Department of Hematology/Oncology [facility] Phone: [phone] Pager: [phone] Email: [email or web address]  02/11/2023 4:30 PM

## 2023-02-12 ENCOUNTER — Telehealth: Payer: Self-pay | Admitting: Hematology and Oncology

## 2023-02-12 NOTE — Telephone Encounter (Signed)
Called patient per 2/15 los notes to schedule f/u. Unable to leave message because voicemail on home and cell phones has not been set up or is full. Mailing reminders for new appointments.

## 2023-02-16 LAB — METHYLMALONIC ACID, SERUM: Methylmalonic Acid, Quantitative: 123 nmol/L (ref 0–378)

## 2023-06-17 NOTE — Progress Notes (Signed)
Office Visit Note  Patient: [name] [name]             Date of Birth: 1986/09/05           MRN: [id number]             PCP: Patient, No Pcp Per Referring: Jaci Standard, MD Visit Date: 06/18/2023 Occupation: Overnight stocker  Subjective:  New Patient (Initial Visit) (Patient states she has pain in her knees, fingers and arms, and lower back. Patient states her arms ache but also have a numb feeling. Patient states if she moves too quickly she gets light headed and dizzy. Patient states she also has pelvic pain. )   History of Present Illness: Morgana Rowley is a 37 y.o. female here for evaluation of elevated inflammatory markers with joint pain, ITP, fatigue, and livedo reticular rashes on her extremities.  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  She has 7 children typically experience decreased platelet count between 50-80,000 during pregnancies but without severe complications. Has persistently been low in 80-120,000 most of the time at baseline.  History of nosebleeds but no serious bleeding events.  Required platelet transfusions with cefazolin during pregnancy. Previous treatments included intermittent use of steroids but was not on any maintenance treatment. She had ongoing follow-up for a while with hematology 10 years ago but recently reestablished care after a gap since about 07/28/2013.  This was largely due to insurance and financial limitations.  Besides her ITP also complains with joint pain and stiffness in multiple areas.  This was ongoing since years ago but feels it is much more pronounced since 2020/07/28.  She did not recall any major illness or medical event at that time.  When chart review did have an evaluation in the emergency department with left arm numbness with MRI showing some degenerative changes at C3-C4 and C5-C6 and T2-T3.  Describes a numbness and tingling type sensation like when an area has  been constricted for too long.  Has somewhat widespread pain that has been better worse for up to months at a time.  In January of this year she was experiencing increased overall body pain and took some prednisone that she had at home and felt a large improvement.  Subsequently got worse again now for about the past 3 months.  Does not see much visible swelling.  She is able to complete her fairly physically demanding work.  Most often has pain in her hands and knees both sides.  She is also seeing podiatry for bilateral bunions with associated cyst on the right foot but not able to take the time off work to have these definitively addressed. She describes generalized alopecia but no lesions or scalp rash.  Poor dentition no oral nasal mucosal ulcers.  Does not describe palpable lymphadenopathy.  Does not describe typical Raynaud's and no skin lesions or pitting of fingers or toes.  Labs reviewed 01/2023 ESR 35 CRP 1.1 HBV neg  Activities of Daily Living:  Patient reports morning stiffness for 24 hours.   Patient Reports nocturnal pain.  Difficulty dressing/grooming: Reports Difficulty climbing stairs: Reports Difficulty getting out of chair: Reports Difficulty using hands for taps, buttons, cutlery, and/or writing: Reports  Review of Systems  Constitutional:  Positive for fatigue.  HENT:  Positive for mouth dryness. Negative for mouth sores.   Eyes:  Negative for dryness.  Respiratory:  Positive for shortness of breath.   Cardiovascular:  Positive for chest pain and palpitations.  Gastrointestinal:  Positive for constipation and diarrhea. Negative for blood in stool.  Endocrine: Positive for increased urination.  Genitourinary:  Negative for involuntary urination.  Musculoskeletal:  Positive for joint pain, gait problem, joint pain, joint swelling, myalgias, muscle weakness, morning stiffness, muscle tenderness and myalgias.  Skin:  Positive for color change, hair loss and sensitivity to  sunlight. Negative for rash.  Allergic/Immunologic: Positive for susceptible to infections.  Neurological:  Positive for dizziness and headaches.  Hematological:  Negative for swollen glands.  Psychiatric/Behavioral:  Positive for depressed mood and sleep disturbance. The patient is nervous/anxious.     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   Polyarthralgia 06/18/2023   Iron deficiency anemia, unspecified 10/10/2013   Cystitis with hematuria 07/16/2013   Migraines 06/05/2013   Obesity 05/15/2013   Idiopathic thrombocytopenic purpura (HCC) 09/21/2011    Past Medical History:  Diagnosis Date   Dental abscess    Dental caries    ITP (idiopathic thrombocytopenic purpura)    ITP (idiopathic thrombocytopenic purpura)    NVD (normal vaginal delivery)    TOBACCO USER 09/10/2009   Quit in April 2014       Family History  Problem Relation Age of Onset   Fibroids Mother    Cancer Father        leukemia   Thrombocytopenia Brother        ITP   Past Surgical History:  Procedure Laterality Date   surgical repair to wrist     TUBAL LIGATION Bilateral 10/31/2013   Procedure: POST PARTUM TUBAL LIGATION;  Surgeon: Willodean Rosenthal, MD;  Location: [facility];  Service: Gynecology;  Laterality: Bilateral;  filshie clips used   Social History   Social History Narrative   Not on file   Immunization History  Administered Date(s) Administered   Td 12/29/2003   Tdap 10/23/2013     Objective: Vital Signs: BP 126/80 (BP Location: Right Arm, Patient Position: Sitting, Cuff Size: Normal)   Pulse 80   Resp 16   Ht 5' 7.25" (1.708 m)   Wt 213 lb (96.6 kg)   LMP 06/02/2023   BMI 33.11 kg/m    Physical Exam Constitutional:      Appearance: She is obese.  HENT:     Mouth/Throat:     Mouth: Mucous membranes are moist.     Pharynx: Oropharynx is clear.      Comments: Poor dentition Eyes:     Conjunctiva/sclera: Conjunctivae normal.  Cardiovascular:     Rate and Rhythm: Normal rate and regular rhythm.  Pulmonary:     Effort: Pulmonary effort is normal.     Breath sounds: Normal breath sounds.  Musculoskeletal:     Right lower leg: No edema.     Left lower leg: No edema.  Lymphadenopathy:     Cervical: No cervical adenopathy.  Skin:    General: Skin is warm and dry.     Comments: Livedo reticularis throughout arms and legs, blanching, no overlying rash or lesions Normal appearing nailfold capillaroscopy  Neurological:     Mental Status: She is alert.  Psychiatric:        Mood and Affect: Mood normal.      Musculoskeletal Exam:  Shoulders full ROM no tenderness or swelling Elbows full ROM no tenderness or swelling Wrists full ROM no tenderness or swelling Fingers full ROM no  tenderness or swelling Knees full ROM no tenderness or swelling Ankles full ROM no tenderness or swelling 1st MTP bunions with mild lateral deviation both sides, nontender cysts on dorsum of right toe   Investigation: No additional findings.  Imaging: No results found.  Recent Labs: Lab Results  Component Value Date   WBC 3.6 (L) 02/11/2023   HGB 14.2 02/11/2023   PLT 108 (L) 02/11/2023   NA 138 02/11/2023   K 3.5 02/11/2023   CL 104 02/11/2023   CO2 25 02/11/2023   GLUCOSE 90 02/11/2023   BUN 16 02/11/2023   CREATININE 0.68 02/11/2023   BILITOT 0.2 (L) 02/11/2023   ALKPHOS 59 02/11/2023   AST 27 02/11/2023   ALT 32 02/11/2023   PROT 7.0 02/11/2023   ALBUMIN 3.9 02/11/2023   CALCIUM 8.8 (L) 02/11/2023   GFRAA >60 04/15/2020    Speciality Comments: No specialty comments available.  Procedures:  No procedures performed Allergies: Patient has no known allergies.   Assessment / Plan:     Visit Diagnoses: Polyarthralgia  Sedimentation rate elevation - Plan: ANA, Anti-DNA antibody, double-stranded, C3 and C4, Urinalysis, Routine w reflex  microscopic, Sedimentation rate  Joint pain in multiple areas ongoing for years with varying severity.  No appreciable peripheral joint synovitis on exam today.  She had previous serologic workup that was unremarkable but about a decade ago.  Will check ANA double-stranded DNA complements for this.  Checking urinalysis screening for microvascular complications.  Also checking sedimentation rate previously elevated though less acute symptoms at this time.  Does not meet specific clinical criteria but would have low threshold to add on treatment if abnormal serology or any more overt inflammation at follow-up exam.  Idiopathic thrombocytopenic purpura (HCC)  Mild thrombocytopenia without severe complications follows up with hematology.  Has not required any specific treatment to maintain platelet counts outside of pregnancies from a decade or longer ago.  Mildly low neutrophil count on February labs but previously normal.  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  No associated ulceration.  Does not describe typical Raynaud's.  Will also screen ANCA serology and antiphospholipid antibodies for possible extra criterion and disease process.  Orders: Orders Placed This Encounter  Procedures   ANA   Anti-DNA antibody, double-stranded   C3 and C4   Beta-2 glycoprotein antibodies   Cardiolipin antibodies, IgG, IgM, IgA   Urinalysis, Routine w reflex microscopic   Sedimentation rate   RPR   ANCA Screen Reflex Titer([facility])   No orders of the defined types were placed in this encounter.    Follow-Up Instructions: Return in about 3 weeks (around 07/09/2023) for New pt arthralgias/ITP f/u 3wks.   Fuller Plan, MD  Note - This record has been created using [employer].  Chart creation errors have been sought, but may not always  have been located. Such creation errors do not reflect on  the standard of medical care.

## 2023-06-18 ENCOUNTER — Ambulatory Visit: Payer: BC Managed Care – PPO | Attending: Internal Medicine | Admitting: Internal Medicine

## 2023-06-18 ENCOUNTER — Encounter: Payer: Self-pay | Admitting: Internal Medicine

## 2023-06-18 VITALS — BP 126/80 | HR 80 | Resp 16 | Ht 67.25 in | Wt 213.0 lb

## 2023-06-18 DIAGNOSIS — R700 Elevated erythrocyte sedimentation rate: Secondary | ICD-10-CM | POA: Diagnosis not present

## 2023-06-18 DIAGNOSIS — R231 Pallor: Secondary | ICD-10-CM

## 2023-06-18 DIAGNOSIS — M2550 Pain in unspecified joint: Secondary | ICD-10-CM

## 2023-06-18 DIAGNOSIS — D693 Immune thrombocytopenic purpura: Secondary | ICD-10-CM

## 2023-06-18 DIAGNOSIS — Z1159 Encounter for screening for other viral diseases: Secondary | ICD-10-CM | POA: Diagnosis not present

## 2023-06-19 LAB — URINALYSIS, ROUTINE W REFLEX MICROSCOPIC: Specific Gravity, Urine: 1.007 (ref 1.001–1.035)

## 2023-06-23 LAB — RPR: RPR Ser Ql: NONREACTIVE

## 2023-06-23 LAB — URINALYSIS, ROUTINE W REFLEX MICROSCOPIC
Glucose, UA: NEGATIVE
Ketones, ur: NEGATIVE
Leukocytes,Ua: NEGATIVE

## 2023-06-25 LAB — ANCA SCREEN W REFLEX TITER: ANCA SCREEN: NEGATIVE

## 2023-06-25 LAB — URINALYSIS, ROUTINE W REFLEX MICROSCOPIC
Bilirubin Urine: NEGATIVE
Hgb urine dipstick: NEGATIVE
Nitrite: NEGATIVE
Protein, ur: NEGATIVE
pH: 6 (ref 5.0–8.0)

## 2023-06-25 LAB — C3 AND C4
C3 Complement: 137 mg/dL (ref 83–193)
C4 Complement: 27 mg/dL (ref 15–57)

## 2023-06-25 LAB — CARDIOLIPIN ANTIBODIES, IGG, IGM, IGA
Anticardiolipin IgA: <2 APL-U/mL (ref <20.0)
Anticardiolipin IgG: <2 GPL-U/mL (ref <20.0)
Anticardiolipin IgM: <2 MPL-U/mL (ref <20.0)

## 2023-06-25 LAB — BETA-2 GLYCOPROTEIN ANTIBODIES
Beta-2 Glyco 1 IgA: <2 U/mL (ref <20.0)
Beta-2 Glyco 1 IgM: <2 U/mL (ref <20.0)
Beta-2 Glyco I IgG: <2 U/mL (ref <20.0)

## 2023-06-25 LAB — ANA: Anti Nuclear Antibody (ANA): NEGATIVE

## 2023-06-25 LAB — SEDIMENTATION RATE: Sed Rate: 19 mm/h (ref 0–20)

## 2023-06-25 LAB — ANTI-DNA ANTIBODY, DOUBLE-STRANDED: ds DNA Ab: 1 [IU]/mL

## 2023-07-19 ENCOUNTER — Ambulatory Visit: Payer: BC Managed Care – PPO | Admitting: Internal Medicine

## 2023-07-19 NOTE — Progress Notes (Deleted)
Office Visit Note  Patient: [name] [name]             Date of Birth: 06-04-1986           MRN: [id number]             PCP: Patient, No Pcp Per Referring: No ref. provider found Visit Date: 07/19/2023   Subjective:  No chief complaint on file.   History of Present Illness: [name] [name] is a 37 y.o. female here for follow up ***   Previous HPI 06/18/23 [name] [name] is a 37 y.o. female here for evaluation of elevated inflammatory markers with joint pain, ITP, fatigue, and livedo reticular rashes on her extremities.  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  She has 7 children typically experience decreased platelet count between 50-80,000 during pregnancies but without severe complications. Has persistently been low in 80-120,000 most of the time at baseline.  History of nosebleeds but no serious bleeding events.  Required platelet transfusions with cefazolin during pregnancy. Previous treatments included intermittent use of steroids but was not on any maintenance treatment. She had ongoing follow-up for a while with hematology 10 years ago but recently reestablished care after a gap since about Aug 02, 2013.  This was largely due to insurance and financial limitations.  Besides her ITP also complains with joint pain and stiffness in multiple areas.  This was ongoing since years ago but feels it is much more pronounced since 08-02-2020.  She did not recall any major illness or medical event at that time.  When chart review did have an evaluation in the emergency department with left arm numbness with MRI showing some degenerative changes at C3-C4 and C5-C6 and T2-T3.  Describes a numbness and tingling type sensation like when an area has been constricted for too long.  Has somewhat widespread pain that has been better worse for up to months at a time.  In January of this year she was experiencing increased overall body pain and  took some prednisone that she had at home and felt a large improvement.  Subsequently got worse again now for about the past 3 months.  Does not see much visible swelling.  She is able to complete her fairly physically demanding work.  Most often has pain in her hands and knees both sides.  She is also seeing podiatry for bilateral bunions with associated cyst on the right foot but not able to take the time off work to have these definitively addressed. She describes generalized alopecia but no lesions or scalp rash.  Poor dentition no oral nasal mucosal ulcers.  Does not describe palpable lymphadenopathy.  Does not describe typical Raynaud's and no skin lesions or pitting of fingers or toes.   Labs reviewed 01/2023 ESR 35 CRP 1.1 HBV neg   No Rheumatology ROS completed.   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   Polyarthralgia 06/18/2023   Iron deficiency anemia, unspecified 10/10/2013   Cystitis with hematuria 07/16/2013   Migraines 06/05/2013   Obesity 05/15/2013   Idiopathic thrombocytopenic purpura (HCC) 09/21/2011    Past Medical History:  Diagnosis Date   Dental abscess    Dental caries    ITP (idiopathic thrombocytopenic purpura)    ITP (idiopathic thrombocytopenic purpura)    NVD (normal vaginal delivery)    TOBACCO USER 09/10/2009  Quit in April 2014       Family History  Problem Relation Age of Onset   Fibroids Mother    Cancer Father        leukemia   Thrombocytopenia Brother        ITP   Past Surgical History:  Procedure Laterality Date   surgical repair to wrist     TUBAL LIGATION Bilateral 10/31/2013   Procedure: POST PARTUM TUBAL LIGATION;  Surgeon: Willodean Rosenthal, MD;  Location: [facility];  Service: Gynecology;  Laterality: Bilateral;  filshie clips used   Social History   Social History Narrative   Not on file   Immunization  History  Administered Date(s) Administered   Td 12/29/2003   Tdap 10/23/2013     Objective: Vital Signs: There were no vitals taken for this visit.   Physical Exam   Musculoskeletal Exam: ***  CDAI Exam: CDAI Score: -- Patient Global: --; Provider Global: -- Swollen: --; Tender: -- Joint Exam 07/19/2023   No joint exam has been documented for this visit   There is currently no information documented on the homunculus. Go to the Rheumatology activity and complete the homunculus joint exam.  Investigation: No additional findings.  Imaging: No results found.  Recent Labs: Lab Results  Component Value Date   WBC 3.6 (L) 02/11/2023   HGB 14.2 02/11/2023   PLT 108 (L) 02/11/2023   NA 138 02/11/2023   K 3.5 02/11/2023   CL 104 02/11/2023   CO2 25 02/11/2023   GLUCOSE 90 02/11/2023   BUN 16 02/11/2023   CREATININE 0.68 02/11/2023   BILITOT 0.2 (L) 02/11/2023   ALKPHOS 59 02/11/2023   AST 27 02/11/2023   ALT 32 02/11/2023   PROT 7.0 02/11/2023   ALBUMIN 3.9 02/11/2023   CALCIUM 8.8 (L) 02/11/2023   GFRAA >60 04/15/2020    Speciality Comments: No specialty comments available.  Procedures:  No procedures performed Allergies: Patient has no known allergies.   Assessment / Plan:     Visit Diagnoses: No diagnosis found.  ***  Orders: No orders of the defined types were placed in this encounter.  No orders of the defined types were placed in this encounter.    Follow-Up Instructions: No follow-ups on file.   Fuller Plan, MD  Note - This record has been created using [employer].  Chart creation errors have been sought, but may not always  have been located. Such creation errors do not reflect on  the standard of medical care.

## 2023-08-12 ENCOUNTER — Inpatient Hospital Stay: Payer: BC Managed Care – PPO | Attending: Hematology

## 2024-02-10 ENCOUNTER — Inpatient Hospital Stay: Payer: BC Managed Care – PPO | Attending: Internal Medicine

## 2024-02-10 ENCOUNTER — Other Ambulatory Visit: Payer: Self-pay | Admitting: Hematology and Oncology

## 2024-02-10 ENCOUNTER — Inpatient Hospital Stay: Payer: BC Managed Care – PPO | Admitting: Hematology and Oncology

## 2024-02-10 DIAGNOSIS — D696 Thrombocytopenia, unspecified: Secondary | ICD-10-CM

## 2024-02-10 NOTE — Progress Notes (Signed)
No show

## 2024-05-12 ENCOUNTER — Encounter: Payer: Self-pay | Admitting: Hematology and Oncology

## 2024-05-12 ENCOUNTER — Telehealth: Payer: Self-pay | Admitting: Hematology and Oncology

## 2024-05-24 ENCOUNTER — Encounter

## 2024-05-26 ENCOUNTER — Inpatient Hospital Stay: Admitting: Hematology and Oncology

## 2024-05-26 ENCOUNTER — Inpatient Hospital Stay

## 2024-05-26 NOTE — Progress Notes (Deleted)
 [facility] [phone]   [phone]  PROGRESS NOTE  Patient Care Team: Patient, No Pcp Per as PCP - General (General Practice)  Hematological/Oncological History # ***  Interval History:  [name] [name] 38 y.o. female with medical history significant for *** presents for a follow up visit. The patient's last visit was on ***. In the interim since the last visit ***  MEDICAL HISTORY:  Past Medical History:  Diagnosis Date   Dental abscess    Dental caries    ITP (idiopathic thrombocytopenic purpura)    ITP (idiopathic thrombocytopenic purpura)    NVD (normal vaginal delivery)    TOBACCO USER 09/10/2009   Quit in April 2014       SURGICAL HISTORY: Past Surgical History:  Procedure Laterality Date   surgical repair to wrist     TUBAL LIGATION Bilateral 10/31/2013   Procedure: POST PARTUM TUBAL LIGATION;  Surgeon: Lenord Radon, MD;  Location: [facility];  Service: Gynecology;  Laterality: Bilateral;  filshie clips used    SOCIAL HISTORY: Social History   Socioeconomic History   Marital status: Married    Spouse name: Not on file   Number of children: Not on file   Years of education: Not on file   Highest education level: Not on file  Occupational History   Not on file  Tobacco Use   Smoking status: Former    Current packs/day: 0.00    Average packs/day: 0.5 packs/day for 10.0 years (5.0 ttl pk-yrs)    Types: Cigarettes    Start date: 04/13/2003    Quit date: 04/12/2013    Years since quitting: 11.1    Passive exposure: Current   Smokeless tobacco: Never  Vaping Use   Vaping status: Never Used  Substance and Sexual Activity   Alcohol use: No   Drug use: No   Sexual activity: Yes    Birth control/protection: Surgical  Other Topics Concern   Not on file  Social History Narrative   Not on file   Social Drivers of Health   Financial Resource Strain: Not on file  Food Insecurity: Not on file  Transportation  Needs: Not on file  Physical Activity: Not on file  Stress: Not on file  Social Connections: Not on file  Intimate Partner Violence: Not on file    FAMILY HISTORY: Family History  Problem Relation Age of Onset   Fibroids Mother    Cancer Father        leukemia   Thrombocytopenia Brother        ITP    ALLERGIES:  has no known allergies.  MEDICATIONS:  Current Outpatient Medications  Medication Sig Dispense Refill   acetaminophen  (TYLENOL ) 500 MG tablet Take 2,000 mg by mouth every 6 (six) hours as needed for moderate pain.     azithromycin  (ZITHROMAX ) 250 MG tablet Take 1 tablet (250 mg total) by mouth daily. Take first 2 tablets together, then 1 every day until finished. (Patient not taking: Reported on 04/15/2020) 6 tablet 0   diclofenac  Sodium (VOLTAREN ) 1 % GEL Apply 2 g topically 4 (four) times daily. Rub into affected area of foot 2 to 4 times daily (Patient not taking: Reported on 06/18/2023) 100 g 2   predniSONE  (STERAPRED UNI-PAK 21 TAB) 10 MG (21) TBPK tablet Take 6 tabs for 2 days, then 5 for 2 days, then 4 for 2 days, then 3 for 2 days, 2 for 2 days, then 1 for 2 days 42 tablet 0  No current facility-administered medications for this visit.    REVIEW OF SYSTEMS:   Constitutional: ( - ) fevers, ( - )  chills , ( - ) night sweats Eyes: ( - ) blurriness of vision, ( - ) double vision, ( - ) watery eyes Ears, nose, mouth, throat, and face: ( - ) mucositis, ( - ) sore throat Respiratory: ( - ) cough, ( - ) dyspnea, ( - ) wheezes Cardiovascular: ( - ) palpitation, ( - ) chest discomfort, ( - ) lower extremity swelling Gastrointestinal:  ( - ) nausea, ( - ) heartburn, ( - ) change in bowel habits Skin: ( - ) abnormal skin rashes Lymphatics: ( - ) new lymphadenopathy, ( - ) easy bruising Neurological: ( - ) numbness, ( - ) tingling, ( - ) new weaknesses Behavioral/Psych: ( - ) mood change, ( - ) new changes  All other systems were reviewed with the patient and are  negative.  PHYSICAL EXAMINATION: ECOG PERFORMANCE STATUS: {CHL ONC ECOG [id number]}  There were no vitals filed for this visit. There were no vitals filed for this visit.  GENERAL: alert, no distress and comfortable SKIN: skin color, texture, turgor are normal, no rashes or significant lesions EYES: conjunctiva are pink and non-injected, sclera clear OROPHARYNX: no exudate, no erythema; lips, buccal mucosa, and tongue normal  NECK: supple, non-tender LYMPH:  no palpable lymphadenopathy in the cervical, axillary or inguinal LUNGS: clear to auscultation and percussion with normal breathing effort HEART: regular rate & rhythm and no murmurs and no lower extremity edema ABDOMEN: soft, non-tender, non-distended, normal bowel sounds Musculoskeletal: no cyanosis of digits and no clubbing  PSYCH: alert & oriented x 3, fluent speech NEURO: no focal motor/sensory deficits  LABORATORY DATA:  I have reviewed the data as listed    Latest Ref Rng & Units 02/11/2023   10:14 AM 04/15/2020    5:53 AM 04/15/2020    5:45 AM  CBC  WBC 4.0 - 10.5 K/uL 3.6   8.3   Hemoglobin 12.0 - 15.0 g/dL 95.6  21.3  08.6   Hematocrit 36.0 - 46.0 % 40.9  39.0  40.6   Platelets 150 - 400 K/uL 108   98        Latest Ref Rng & Units 02/11/2023   10:14 AM 04/15/2020    5:53 AM 04/15/2020    5:45 AM  CMP  Glucose 70 - 99 mg/dL 90  97  578   BUN 6 - 20 mg/dL 16  11  12    Creatinine 0.44 - 1.00 mg/dL 4.69  6.29  5.28   Sodium 135 - 145 mmol/L 138  139  137   Potassium 3.5 - 5.1 mmol/L 3.5  3.2  3.3   Chloride 98 - 111 mmol/L 104  103  104   CO2 22 - 32 mmol/L 25   23   Calcium  8.9 - 10.3 mg/dL 8.8   9.0   Total Protein 6.5 - 8.1 g/dL 7.0   7.2   Total Bilirubin 0.3 - 1.2 mg/dL 0.2   0.3   Alkaline Phos 38 - 126 U/L 59   46   AST 15 - 41 U/L 27   19   ALT 0 - 44 U/L 32   18     No results found for: "MPROTEIN" No results found for: "KPAFRELGTCHN", "LAMBDASER", "KAPLAMBRATIO"   BLOOD FILM: *** Review of  the peripheral blood smear showed normal appearing white cells with neutrophils that were appropriately  lobated and granulated. There was no predominance of bi-lobed or hyper-segmented neutrophils appreciated. No Dohle bodies were noted. There was no left shifting, immature forms or blasts noted. Lymphocytes remain normal in size without any predominance of large granular lymphocytes. Red cells show no anisopoikilocytosis, macrocytes , microcytes or polychromasia. There were no schistocytes, target cells, echinocytes, acanthocytes, dacrocytes, or stomatocytes.There was no rouleaux formation, nucleated red cells, or intra-cellular inclusions noted. The platelets are normal in size, shape, and color without any clumping evident.  RADIOGRAPHIC STUDIES: I have personally reviewed the radiological images as listed and agreed with the findings in the report. No results found.  ASSESSMENT & PLAN ***  No orders of the defined types were placed in this encounter.   All questions were answered. The patient knows to call the clinic with any problems, questions or concerns.  A total of more than {CHL ONC TIME VISIT - [id number]} were spent on this encounter with face-to-face time and non-face-to-face time, including preparing to see the patient, ordering tests and/or medications, counseling the patient and coordination of care as outlined above.   Rogerio Clay, MD Department of Hematology/Oncology [facility] Phone: [phone] Pager: [phone] Email: [email or web address]  05/26/2024 7:32 AM

## 2024-06-05 ENCOUNTER — Ambulatory Visit: Admitting: Podiatry

## 2024-08-31 ENCOUNTER — Ambulatory Visit (INDEPENDENT_AMBULATORY_CARE_PROVIDER_SITE_OTHER): Admitting: Podiatry

## 2024-08-31 ENCOUNTER — Encounter: Payer: Self-pay | Admitting: Podiatry

## 2024-08-31 ENCOUNTER — Ambulatory Visit (INDEPENDENT_AMBULATORY_CARE_PROVIDER_SITE_OTHER)

## 2024-08-31 VITALS — Ht 67.25 in | Wt 213.0 lb

## 2024-08-31 DIAGNOSIS — Q666 Other congenital valgus deformities of feet: Secondary | ICD-10-CM

## 2024-08-31 DIAGNOSIS — M21611 Bunion of right foot: Secondary | ICD-10-CM

## 2024-08-31 DIAGNOSIS — M21612 Bunion of left foot: Secondary | ICD-10-CM | POA: Diagnosis not present

## 2024-08-31 MED ORDER — MELOXICAM 15 MG PO TABS: 15.0000 mg | ORAL_TABLET | Freq: Every day | ORAL | 0 refills | Status: AC | PRN

## 2024-08-31 NOTE — Patient Instructions (Addendum)
 For inserts I like POWERSTEPS, SUPERFEET, [employer]  Ankle Sprain, Phase I Rehab An ankle sprain is an injury to the tissues that connect bone to bone (ligaments) in your ankle. Ankle sprains can cause stiffness, loss of motion, and loss of strength. Ask your health care provider which exercises are safe for you. Do exercises exactly as told by your provider and adjust them as directed. It is normal to feel mild stretching, pulling, tightness, or discomfort as you do these exercises. Stop right away if you feel sudden pain or your pain gets worse. Do not begin these exercises until told by your provider. Stretching and range-of-motion exercises These exercises warm up your muscles and joints. They can improve the movement and flexibility of your lower leg and ankle. They also help to relieve pain and stiffness. Gastroc and soleus stretch This exercise is also called a calf stretch. It stretches the muscles in the back of the lower leg. These muscles are the gastrocnemius, or gastroc, and the soleus. Sit on the floor with your left / right leg extended. Loop a belt or towel around the ball of your left / right foot. The ball of your foot is on the walking surface, right under your toes. Keep your left / right ankle and foot relaxed and keep your knee straight. Use the belt or towel to pull your foot toward you. You should feel a gentle stretch behind your calf or knee in your gastroc muscle. Hold this position for __________ seconds, then release to the starting position. Repeat the exercise with your knee bent. You can put a pillow or a rolled bath towel under your knee to support it. You should feel a stretch deep in your calf in the soleus muscle or at your Achilles tendon. Repeat __________ times. Complete this exercise __________ times a day. Ankle alphabet  Sit with your left / right leg supported at the lower leg. Do not rest your foot on anything. Make sure your foot has room to move  freely. Think of your left / right foot as a paintbrush. Move your foot to trace each letter of the alphabet in the air. Keep your hip and knee still while you trace. Make the letters as large as you can without feeling discomfort. Trace every letter from A to Z. Repeat __________ times. Complete this exercise __________ times a day. Strengthening exercises These exercises build strength and endurance in your ankle and lower leg. Endurance is the ability to use your muscles for a long time, even after they get tired. Ankle dorsiflexion  Secure a rubber exercise band or tube to an object, such as a table leg, that will stay still when the band is pulled. Secure the other end around your left / right foot. Sit on the floor facing the object, with your left / right leg extended. The band or tube should be slightly tense when your foot is relaxed. Slowly bring your foot toward you, bringing the top of your foot toward your shin (dorsiflexion), and pulling the band tighter. Hold this position for __________ seconds. Slowly return your foot to the starting position. Repeat __________ times. Complete this exercise __________ times a day. Ankle plantar flexion  Sit on the floor with your left / right leg extended. Loop a rubber exercise tube or band around the ball of your left / right foot. The ball of your foot is on the walking surface, right under your toes. Hold the ends of the band or tube in  your hands. The band or tube should be slightly tense when your foot is relaxed. Slowly point your foot and toes downward to tilt the top of your foot away from your shin (plantar flexion). Hold this position for __________ seconds. Slowly return your foot to the starting position. Repeat __________ times. Complete this exercise __________ times a day. Ankle eversion  Sit on the floor with your legs straight out in front of you. Loop a rubber exercise band or tube around the ball of your left / right  foot. The ball of your foot is on the walking surface, right under your toes. Hold the ends of the band in your hands or secure the band to a stable object. The band or tube should be slightly tense when your foot is relaxed. Slowly push your foot outward, away from your other leg (eversion). Hold this position for __________ seconds. Slowly return your foot to the starting position. Repeat __________ times. Complete this exercise __________ times a day. This information is not intended to replace advice given to you by your health care provider. Make sure you discuss any questions you have with your health care provider. Document Revised: 10/07/2022 Document Reviewed: 10/07/2022 Elsevier Patient Education  2024 [employer]  --  Meloxicam  Tablets What is this medication? MELOXICAM  (mel OX i cam) treats mild to moderate pain, inflammation, or arthritis. It works by decreasing inflammation. It belongs to a group of medications called NSAIDs. This medicine may be used for other purposes; ask your health care provider or pharmacist if you have questions. COMMON BRAND NAME(S): Mobic  What should I tell my care team before I take this medication? They need to know if you have any of these conditions: Asthma Bleeding problems Dehydration Frequently drink alcohol Have had a heart attack, stroke, or mini-stroke Heart bypass surgery, or CABG, within the past 2 weeks Heart or blood vessel conditions Heart failure High blood pressure Kidney disease Liver disease Stomach bleeding Stomach ulcers, other stomach or intestine problems Tobacco use An unusual or allergic reaction to meloxicam , other medications, foods, dyes, or preservatives Pregnant or trying to get pregnant Breastfeeding How should I use this medication? Take this medication by mouth. Take it as directed on the prescription label at the same time every day. You can take it with or without food. If it upsets your stomach, take  it with food. Do not use it more often than directed. There may be unused or extra doses in the bottle after you finish your treatment. Talk to your care team if you have questions about your dose. A special MedGuide will be given to you by the pharmacist with each prescription and refill. Be sure to read this information carefully each time. Talk to your care team about the use of this medication in children. Special care may be needed. People over 104 years of age may have a stronger reaction and need a smaller dose. Overdosage: If you think you have taken too much of this medicine contact a poison control center or emergency room at once. NOTE: This medicine is only for you. Do not share this medicine with others. What if I miss a dose? If you miss a dose, take it as soon as you can. If it is almost time for your next dose, take only that dose. Do not take double or extra doses. What may interact with this medication? Do not take this medication with any of the following: Cidofovir Ketorolac  This medication may also interact with  the following: Alcohol Aspirin and aspirin-like medications Blood thinners Cyclosporine Digoxin Diuretics Lithium Medications for high blood pressure Methotrexate Other NSAIDs, medications for pain and inflammation, such as ibuprofen  or naproxen Some medications for depression Steroid medications, such as prednisone  or cortisone Supplements, such as garlic, ginger, ginkgo, methylsulfonylmethane (MSM) This list may not describe all possible interactions. Give your health care provider a list of all the medicines, herbs, non-prescription drugs, or dietary supplements you use. Also tell them if you smoke, drink alcohol, or use illegal drugs. Some items may interact with your medicine. What should I watch for while using this medication? Visit your care team for regular checks on your progress. Tell your care team if your symptoms do not start to get better or if  they get worse. Do not take aspirin or other NSAIDs, such as ibuprofen  or naproxen, while you are taking this medication. Side effects, such as upset stomach, nausea, and ulcers, may be more likely to occur. Many over-the-counter medications contain aspirin, ibuprofen , or naproxen. It is important to read labels carefully. Talk to your care team about all the medications you take. They can tell you what is safe to take together. This medication can cause serious bleeding, ulcers, or tears in the stomach. These problems can occur at any time and with no warning signs. They are more common with long-term use. Talk to your care team right away if you have stomach pain, bloody or black, tar-like stools, or vomit blood that is red or looks like coffee grounds. This medication increases the risk of blood clots, heart attack, and stroke. These events can occur at any time. They are more common with long-term use and in those who have heart disease. If you take aspirin to prevent a heart attack or stroke, talk to your care team. They can help you find an option that works for you. This medication may cause serious skin reactions. They can happen weeks to months after starting the medication. Talk to your care team right away if you have fevers or flu-like symptoms with a rash. The rash may be red or purple and then turn into blisters or peeling of the skin. Or you might notice a red rash with swelling of the face, lips, or lymph nodes in your neck or under your arms. Talk to your care team if you may be pregnant. Taking this medication after 20 weeks of pregnancy may cause serious birth defects. Use of this medication after 30 weeks of pregnancy is not recommended. This medication may cause infertility. It is usually temporary. Talk to your care team if you are concerned about your fertility. What side effects may I notice from receiving this medication? Side effects that you should report to your care team as soon  as possible: Allergic reactions--skin rash, itching, hives, swelling of the face, lips, tongue, or throat Bleeding--bloody or black, tar-like stools, vomiting blood or brown material that looks like coffee grounds, red or dark brown urine, small red or purple spots on skin, unusual bruising or bleeding Heart attack--pain or tightness in the chest, shoulders, arms, or jaw, nausea, shortness of breath, cold or clammy skin, feeling faint or lightheaded Heart failure--shortness of breath, swelling of the ankles, feet, or hands, sudden weight gain, unusual weakness or fatigue Increase in blood pressure Kidney injury--decrease in the amount of urine, swelling of the ankles, hands, or feet Liver injury--right upper belly pain, loss of appetite, nausea, light-colored stool, dark yellow or brown urine, yellowing skin or eyes,  unusual weakness or fatigue Rash, fever, and swollen lymph nodes Redness, blistering, peeling, or loosening of the skin, including inside the mouth Round red or dark patches on the skin that may itch, burn, and blister Stroke--sudden numbness or weakness of the face, arm, or leg, trouble speaking, confusion, trouble walking, loss of balance or coordination, dizziness, severe headache, change in vision Side effects that usually do not require medical attention (report these to your care team if they continue or are bothersome): Headache Loss of appetite Nausea Upset stomach This list may not describe all possible side effects. Call your doctor for medical advice about side effects. You may report side effects to FDA at 1-800-FDA-1088. Where should I keep my medication? Keep out of the reach of children and pets. Store at room temperature between 20 and 25 degrees C (68 and 77 degrees F). Protect from moisture. Keep the container tightly closed. Get rid of any unused medication after the expiration date. To get rid of medications that are no longer needed or have expired: Take the  medication to a medication take-back program. Check with your pharmacy or law enforcement to find a location. If you cannot return the medication, check the label or package insert to see if the medication should be thrown out in the garbage or flushed down the toilet. If you are not sure, ask your care team. If it is safe to put it in the trash, empty the medication out of the container. Mix the medication with cat litter, dirt, coffee grounds, or other unwanted substance. Seal the mixture in a bag or container. Put it in the trash. NOTE: This sheet is a summary. It may not cover all possible information. If you have questions about this medicine, talk to your doctor, pharmacist, or health care provider.  2025 Elsevier/Gold Standard (2024-02-22 00:00:00)

## 2024-08-31 NOTE — Progress Notes (Signed)
  Subjective:  Patient ID: [name] [name], female    DOB: Sep 23, 1986,  MRN: [id number]  Chief Complaint  Patient presents with   Foot Pain    Pt is here due to left foot pain, states the pain begin in February all of sudden, no injury to the foot, states the last 2-3 weeks her foot has started to feel better, no prior treatment before this visit.    Discussed the use of AI scribe software for clinical note transcription with the patient, who gave verbal consent to proceed.  History of Present Illness [name] [name] is a 38 year old female who presents with left foot pain.  Left foot pain began in February, initially constant and severe, causing limping. Pain is located on the dorsum of the foot, worsens with ambulation, and certain movements like turning over while lying down. Pain is particularly intense when transitioning from sitting to standing. Recently, pain has decreased in intensity and frequency, occurring randomly, primarily with certain foot movements. No swelling, numbness, or tingling is present. Pain onset was not linked to a specific injury. She manages pain by changing shoes based on pain levels, keeping various shoes in her car. Pain persists, especially when walking or touching certain areas of the foot. She desires bunion surgery but cannot take time off work. She has not used shoe inserts and is unsure about the appropriate support due to changing pain levels.      Objective:  There were no vitals filed for this visit.  Physical Exam General: AAO x3, NAD  Dermatological: Skin is warm, dry and supple bilateral.  There are no open sores, no preulcerative lesions, no rash or signs of infection present.  Vascular: Dorsalis Pedis artery and Posterior Tibial artery pedal pulses are 2/4 bilateral with immedate capillary fill time. There is no pain with calf compression, swelling, warmth, erythema.   Neruologic: Grossly intact via light touch bilateral.    Musculoskeletal: Decreased medial arch height.  She has tenderness on the subtalar joint, sinus tarsi laterally in the left side as well as to the dorsal lateral aspect of the midfoot but no significant area of pinpoint tenderness.  Some slight edema present there is no erythema.  Flexor, extensor tendons intact.  MMT 5/5.  My knee is present and unchanged.  Gait: Unassisted, Nonantalgic.     No images are attached to the encounter.    Results RADIOLOGY Foot X-ray: No significant arthritic changes, possible joint impingement on the posterior subtalar joint due to flatfoot.  No evidence of acute fracture.   Assessment:   1. Pes planovalgus      Plan:  Patient was evaluated and treated and all questions answered.  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 X-rays showed no significant arthritis. Pain exacerbated by movement, attributed to wear and tear and foot structure. - Prescribed meloxicam  once daily as needed for inflammation. -Discussed possible steroid injection - Provided exercises to strengthen ankle and foot ligaments and muscles. - Recommended shoe inserts for arch support and pressure relief, including specific brands.   No follow-ups on file.   [name] [name] DPM

## 2024-09-05 ENCOUNTER — Ambulatory Visit: Admitting: Podiatry

## 2024-11-14 ENCOUNTER — Emergency Department (HOSPITAL_COMMUNITY): Admission: EM | Admit: 2024-11-14 | Discharge: 2024-11-14 | Discharge status: Against Medical Advice

## 2024-11-14 NOTE — ED Notes (Signed)
 Na x1 from lobby.

## 2024-11-14 NOTE — ED Notes (Signed)
 NA x2 from lobby and outside ED entrance.
